# Patient Record
Sex: Female | Born: 1950 | Race: White | Hispanic: No | Marital: Married | Smoking: Never smoker
Health system: Southern US, Community
[De-identification: ages and names within clinical notes are randomized; demographics above are authoritative.]

## PROBLEM LIST (undated history)

## (undated) DIAGNOSIS — R600 Localized edema: Secondary | ICD-10-CM

## (undated) DIAGNOSIS — K219 Gastro-esophageal reflux disease without esophagitis: Secondary | ICD-10-CM

## (undated) DIAGNOSIS — G4730 Sleep apnea, unspecified: Secondary | ICD-10-CM

## (undated) DIAGNOSIS — E785 Hyperlipidemia, unspecified: Secondary | ICD-10-CM

## (undated) DIAGNOSIS — G47411 Narcolepsy with cataplexy: Secondary | ICD-10-CM

## (undated) DIAGNOSIS — R011 Cardiac murmur, unspecified: Secondary | ICD-10-CM

## (undated) DIAGNOSIS — E039 Hypothyroidism, unspecified: Secondary | ICD-10-CM

## (undated) DIAGNOSIS — M810 Age-related osteoporosis without current pathological fracture: Secondary | ICD-10-CM

## (undated) DIAGNOSIS — G629 Polyneuropathy, unspecified: Secondary | ICD-10-CM

## (undated) DIAGNOSIS — R002 Palpitations: Secondary | ICD-10-CM

## (undated) DIAGNOSIS — R7303 Prediabetes: Secondary | ICD-10-CM

## (undated) DIAGNOSIS — E669 Obesity, unspecified: Secondary | ICD-10-CM

## (undated) DIAGNOSIS — I10 Essential (primary) hypertension: Secondary | ICD-10-CM

## (undated) DIAGNOSIS — C50911 Malignant neoplasm of unspecified site of right female breast: Secondary | ICD-10-CM

## (undated) DIAGNOSIS — M65331 Trigger finger, right middle finger: Secondary | ICD-10-CM

## (undated) DIAGNOSIS — F329 Major depressive disorder, single episode, unspecified: Secondary | ICD-10-CM

## (undated) DIAGNOSIS — G2581 Restless legs syndrome: Secondary | ICD-10-CM

## (undated) DIAGNOSIS — M1990 Unspecified osteoarthritis, unspecified site: Secondary | ICD-10-CM

## (undated) DIAGNOSIS — K5792 Diverticulitis of intestine, part unspecified, without perforation or abscess without bleeding: Secondary | ICD-10-CM

## (undated) DIAGNOSIS — F32A Depression, unspecified: Secondary | ICD-10-CM

## (undated) DIAGNOSIS — I82409 Acute embolism and thrombosis of unspecified deep veins of unspecified lower extremity: Secondary | ICD-10-CM

## (undated) DIAGNOSIS — M65332 Trigger finger, left middle finger: Secondary | ICD-10-CM

## (undated) HISTORY — PX: BREAST SURGERY: SHX581

## (undated) HISTORY — PX: MASTECTOMY: SHX3

## (undated) HISTORY — PX: ABDOMINAL HYSTERECTOMY: SHX81

## (undated) HISTORY — PX: CHOLECYSTECTOMY: SHX55

## (undated) HISTORY — PX: APPENDECTOMY: SHX54

---

## 1999-06-20 DIAGNOSIS — C50911 Malignant neoplasm of unspecified site of right female breast: Secondary | ICD-10-CM

## 1999-06-20 HISTORY — DX: Malignant neoplasm of unspecified site of right female breast: C50.911

## 2013-10-13 ENCOUNTER — Ambulatory Visit: Payer: Self-pay | Admitting: Family Medicine

## 2013-11-04 ENCOUNTER — Ambulatory Visit: Payer: Self-pay | Admitting: Family Medicine

## 2013-11-05 ENCOUNTER — Ambulatory Visit: Payer: Self-pay | Admitting: Surgery

## 2013-11-07 ENCOUNTER — Ambulatory Visit: Payer: Self-pay | Admitting: Surgery

## 2013-12-15 DIAGNOSIS — C50911 Malignant neoplasm of unspecified site of right female breast: Secondary | ICD-10-CM | POA: Insufficient documentation

## 2014-01-20 DIAGNOSIS — Z1371 Encounter for nonprocreative screening for genetic disease carrier status: Secondary | ICD-10-CM | POA: Insufficient documentation

## 2014-07-22 DIAGNOSIS — M2550 Pain in unspecified joint: Secondary | ICD-10-CM | POA: Insufficient documentation

## 2014-10-10 NOTE — Op Note (Signed)
PATIENT NAME:  Karen [name], Karen [name] MR#:  [id number] DATE OF BIRTH:  1951-02-22  DATE OF PROCEDURE:  11/07/2013  PREOPERATIVE DIAGNOSIS:  Nonresolving traumatic left thigh hematoma.   POSTOPERATIVE DIAGNOSIS:  Nonresolving left thigh seroma.   PROCEDURE PERFORMED:  Incision and drainage of large left thigh seroma, approximately 20 x 7 x 2 cm.   ANESTHESIA:  General endotracheal.   ESTIMATED BLOOD LOSS:  10 mL.   COMPLICATIONS:  None.   SPECIMEN:  None.  IMPLANTS:  Two 10 French JP drains.   INDICATIONS:  Ms. Eccleston is a pleasant female with a history of a traumatic left thigh hematoma following a fall.  She had a CT scan which showed a large fluid collection.  I was concerned for a Morel-Lavalle type lesion that has an internal gloving came from shearing and due to its nonresolution and pain she was brought to the Operating Room for incision and drainage and management of this wound.   DETAILS OF PROCEDURE:  Are as follows:  Informed consent was obtained.  Ms. Billiot was brought to the Operating Room suite.  She was induced.  Endotracheal tube was placed, general anesthesia was administered.  Her left thigh was then prepped and draped in a standard surgical fashion.  A timeout was then performed correctly identifying the patient's name, operative site and procedure to be performed.  A 15 blade knife was used to incise the area which appeared to be most thin and superficial of the cavity.  Approximately 750 mL of serous fluid was immediately encountered upon popping into the cavity.  I then irrigated the cavity and two 10 [place] JP drains were placed at the most superior aspect of the lesions, one superiorly and one more dependent inferiorly.  These were sutured in place with a 3-0 nylon suture.  The more caudate incision was then closed with interrupted 3-0 nylon sutures.  A sterile dressing was then placed over the wound.  The wound and the leg was wrapped with an Ace bandage and JP bulbs were  connected.  The patient was then awoken, extubated and brought to the postanesthesia care unit.  There were no immediate complications.  Needle, sponge, and instrument counts correct at the end of the procedure.     ____________________________ Glena Norfolk. Sedale Jenifer, MD cal:ea D: 11/07/2013 22:17:23 ET T: 11/08/2013 06:34:50 ET JOB#: [id number]  cc: Harrell Gave A. Chico Cawood, MD, <Dictator> Floyde Parkins MD ELECTRONICALLY SIGNED 11/18/2013 17:15

## 2014-11-25 ENCOUNTER — Ambulatory Visit
Admission: RE | Admit: 2014-11-25 | Discharge: 2014-11-25 | Disposition: A | Discharge status: Home / Self Care | Payer: 59 | Source: Ambulatory Visit | Attending: Cardiology | Admitting: Cardiology

## 2014-11-25 ENCOUNTER — Encounter: Payer: Self-pay | Admitting: *Deleted

## 2014-11-25 ENCOUNTER — Encounter
Admission: RE | Disposition: A | Discharge status: Home / Self Care | Payer: Self-pay | Source: Ambulatory Visit | Attending: Cardiology

## 2014-11-25 DIAGNOSIS — G4730 Sleep apnea, unspecified: Secondary | ICD-10-CM | POA: Insufficient documentation

## 2014-11-25 DIAGNOSIS — M1990 Unspecified osteoarthritis, unspecified site: Secondary | ICD-10-CM | POA: Insufficient documentation

## 2014-11-25 DIAGNOSIS — R0789 Other chest pain: Secondary | ICD-10-CM | POA: Insufficient documentation

## 2014-11-25 DIAGNOSIS — E785 Hyperlipidemia, unspecified: Secondary | ICD-10-CM | POA: Diagnosis not present

## 2014-11-25 DIAGNOSIS — R51 Headache: Secondary | ICD-10-CM | POA: Insufficient documentation

## 2014-11-25 DIAGNOSIS — Z811 Family history of alcohol abuse and dependence: Secondary | ICD-10-CM | POA: Diagnosis not present

## 2014-11-25 DIAGNOSIS — G629 Polyneuropathy, unspecified: Secondary | ICD-10-CM | POA: Diagnosis not present

## 2014-11-25 DIAGNOSIS — R0602 Shortness of breath: Secondary | ICD-10-CM | POA: Insufficient documentation

## 2014-11-25 DIAGNOSIS — Z800 Family history of malignant neoplasm of digestive organs: Secondary | ICD-10-CM | POA: Diagnosis not present

## 2014-11-25 DIAGNOSIS — G4700 Insomnia, unspecified: Secondary | ICD-10-CM | POA: Diagnosis not present

## 2014-11-25 DIAGNOSIS — Z833 Family history of diabetes mellitus: Secondary | ICD-10-CM | POA: Insufficient documentation

## 2014-11-25 DIAGNOSIS — Z8262 Family history of osteoporosis: Secondary | ICD-10-CM | POA: Diagnosis not present

## 2014-11-25 DIAGNOSIS — Z8249 Family history of ischemic heart disease and other diseases of the circulatory system: Secondary | ICD-10-CM | POA: Insufficient documentation

## 2014-11-25 DIAGNOSIS — Z818 Family history of other mental and behavioral disorders: Secondary | ICD-10-CM | POA: Diagnosis not present

## 2014-11-25 DIAGNOSIS — E039 Hypothyroidism, unspecified: Secondary | ICD-10-CM | POA: Insufficient documentation

## 2014-11-25 DIAGNOSIS — Z8489 Family history of other specified conditions: Secondary | ICD-10-CM | POA: Diagnosis not present

## 2014-11-25 DIAGNOSIS — Z823 Family history of stroke: Secondary | ICD-10-CM | POA: Insufficient documentation

## 2014-11-25 DIAGNOSIS — I10 Essential (primary) hypertension: Secondary | ICD-10-CM | POA: Diagnosis not present

## 2014-11-25 DIAGNOSIS — Z853 Personal history of malignant neoplasm of breast: Secondary | ICD-10-CM | POA: Diagnosis not present

## 2014-11-25 DIAGNOSIS — Z79899 Other long term (current) drug therapy: Secondary | ICD-10-CM | POA: Insufficient documentation

## 2014-11-25 DIAGNOSIS — Z8261 Family history of arthritis: Secondary | ICD-10-CM | POA: Insufficient documentation

## 2014-11-25 DIAGNOSIS — K219 Gastro-esophageal reflux disease without esophagitis: Secondary | ICD-10-CM | POA: Diagnosis not present

## 2014-11-25 DIAGNOSIS — Z803 Family history of malignant neoplasm of breast: Secondary | ICD-10-CM | POA: Diagnosis not present

## 2014-11-25 DIAGNOSIS — F329 Major depressive disorder, single episode, unspecified: Secondary | ICD-10-CM | POA: Diagnosis not present

## 2014-11-25 DIAGNOSIS — G47411 Narcolepsy with cataplexy: Secondary | ICD-10-CM | POA: Insufficient documentation

## 2014-11-25 HISTORY — DX: Sleep apnea, unspecified: G47.30

## 2014-11-25 HISTORY — DX: Hypothyroidism, unspecified: E03.9

## 2014-11-25 HISTORY — PX: CARDIAC CATHETERIZATION: SHX172

## 2014-11-25 SURGERY — LEFT HEART CATH
Anesthesia: Moderate Sedation

## 2014-11-25 MED ORDER — FENTANYL CITRATE (PF) 100 MCG/2ML IJ SOLN
INTRAMUSCULAR | Status: DC | PRN
  Administered 2014-11-25: 50 ug via INTRAVENOUS

## 2014-11-25 MED ORDER — SODIUM CHLORIDE 0.9 % IV SOLN: 250.0000 mL | INTRAVENOUS | Status: DC | PRN

## 2014-11-25 MED ORDER — IOHEXOL 300 MG/ML  SOLN
INTRAMUSCULAR | Status: DC | PRN
  Administered 2014-11-25: 30 mL via INTRAVENOUS
  Administered 2014-11-25: 70 mL via INTRAVENOUS

## 2014-11-25 MED ORDER — MIDAZOLAM HCL 2 MG/2ML IJ SOLN
INTRAMUSCULAR | Status: DC | PRN
  Administered 2014-11-25 (×2): 1 mg via INTRAVENOUS

## 2014-11-25 MED ORDER — FENTANYL CITRATE (PF) 100 MCG/2ML IJ SOLN
INTRAMUSCULAR | Status: AC
  Filled 2014-11-25: qty 2

## 2014-11-25 MED ORDER — SODIUM CHLORIDE 0.9 % WEIGHT BASED INFUSION: 3.0000 mL/kg/h | INTRAVENOUS | Status: DC

## 2014-11-25 MED ORDER — SODIUM CHLORIDE 0.9 % IJ SOLN
3.0000 mL | INTRAMUSCULAR | Status: DC | PRN
  Administered 2014-11-25: 3 mL via INTRAVENOUS
  Filled 2014-11-25: qty 10

## 2014-11-25 MED ORDER — MIDAZOLAM HCL 2 MG/2ML IJ SOLN
INTRAMUSCULAR | Status: AC
  Filled 2014-11-25: qty 2

## 2014-11-25 MED ORDER — SODIUM CHLORIDE 0.9 % IV SOLN
INTRAVENOUS | Status: DC
  Administered 2014-11-25: 07:00:00 via INTRAVENOUS

## 2014-11-25 MED ORDER — SODIUM CHLORIDE 0.9 % IJ SOLN: 3.0000 mL | INTRAMUSCULAR | Status: DC | PRN

## 2014-11-25 MED ORDER — SODIUM CHLORIDE 0.9 % IJ SOLN: 3.0000 mL | Freq: Two times a day (BID) | INTRAMUSCULAR | Status: DC

## 2014-11-25 MED ORDER — ONDANSETRON HCL 4 MG/2ML IJ SOLN: 4.0000 mg | Freq: Four times a day (QID) | INTRAMUSCULAR | Status: DC | PRN

## 2014-11-25 MED ORDER — ACETAMINOPHEN 325 MG PO TABS: 650.0000 mg | ORAL_TABLET | ORAL | Status: DC | PRN

## 2014-11-25 MED ORDER — HEPARIN (PORCINE) IN NACL 2-0.9 UNIT/ML-% IJ SOLN
INTRAMUSCULAR | Status: AC
  Filled 2014-11-25: qty 1000

## 2014-11-25 MED ORDER — DIAZEPAM 5 MG PO TABS: 5.0000 mg | ORAL_TABLET | ORAL | Status: DC | PRN

## 2014-11-25 SURGICAL SUPPLY — 9 items
CATH INFINITI 5FR ANG PIGTAIL (CATHETERS) ×2 IMPLANT
CATH INFINITI 5FR JL4 (CATHETERS) ×2 IMPLANT
CATH INFINITI JR4 5F (CATHETERS) ×2 IMPLANT
DEVICE CLOSURE MYNXGRIP 5F (Vascular Products) ×2 IMPLANT
KIT MANI 3VAL PERCEP (MISCELLANEOUS) ×2 IMPLANT
NEEDLE PERC 18GX7CM (NEEDLE) ×2 IMPLANT
PACK CARDIAC CATH (CUSTOM PROCEDURE TRAY) ×2 IMPLANT
SHEATH AVANTI 5FR X 11CM (SHEATH) ×2 IMPLANT
WIRE EMERALD 3MM-J .035X150CM (WIRE) ×2 IMPLANT

## 2014-11-25 NOTE — Discharge Instructions (Signed)
Coronary Angiogram °A coronary angiogram, also called coronary angiography, is an X-ray procedure used to look at the arteries in the heart. In this procedure, a dye (contrast dye) is injected through a long, hollow tube (catheter). The catheter is about the size of a piece of cooked spaghetti and is inserted through your groin, wrist, or arm. The dye is injected into each artery, and X-rays are then taken to show if there is a blockage in the arteries of your heart. °LET YOUR HEALTH CARE PROVIDER KNOW ABOUT: °· Any allergies you have, including allergies to shellfish or contrast dye.   °· All medicines you are taking, including vitamins, herbs, eye drops, creams, and over-the-counter medicines.   °· Previous problems you or members of your family have had with the use of anesthetics.   °· Any blood disorders you have.   °· Previous surgeries you have had. °· History of kidney problems or failure.   °· Other medical conditions you have. °RISKS AND COMPLICATIONS  °Generally, a coronary angiogram is a safe procedure. However, problems can occur and include: °· Allergic reaction to the dye. °· Bleeding from the access site or other locations. °· Kidney injury, especially in people with impaired kidney function.  °· Stroke (rare). °· Heart attack (rare). °BEFORE THE PROCEDURE  °· Do not eat or drink anything after midnight the night before the procedure or as directed by your health care provider.   °· Ask your health care provider about changing or stopping your regular medicines. This is especially important if you are taking diabetes medicines or blood thinners. °PROCEDURE °· You may be given a medicine to help you relax (sedative) before the procedure. This medicine is given through an intravenous (IV) access tube that is inserted into one of your veins.   °· The area where the catheter will be inserted will be washed and shaved. This is usually done in the groin but may be done in the fold of your arm (near your  elbow) or in the wrist.    °· A medicine will be given to numb the area where the catheter will be inserted (local anesthetic).   °· The health care provider will insert the catheter into an artery. The catheter will be guided by using a special type of X-ray (fluoroscopy) of the blood vessel being examined.   °· A special dye will then be injected into the catheter, and X-rays will be taken. The dye will help to show where any narrowing or blockages are located in the heart arteries.   °AFTER THE PROCEDURE  °· If the procedure is done through the leg, you will be kept in bed lying flat for several hours. You will be instructed to not bend or cross your legs. °· The insertion site will be checked frequently.   °· The pulse in your feet or wrist will be checked frequently.   °· Additional blood tests, X-rays, and an electrocardiogram may be done.   °Document Released: 12/10/2002 Document Revised: 10/20/2013 Document Reviewed: 10/28/2012 °[employer]® Patient Information ©2015 [employer]. This information is not intended to replace advice given to you by your health care provider. Make sure you discuss any questions you have with your health care provider. ° °

## 2014-11-25 NOTE — H&P (Signed)
Chief Complaint: Chief Complaint  Patient presents with  . Shortness of Breath  still with exertion  . Follow-up  had echo and stress  Date of Service: 11/17/2014 Date of Birth: Oct 26, 1950 PCP: Ricardo Jericho, NP  History of Present Illness: Karen [name] is a 64 y.o.female patient who presents for follow-up visit. Continues to have exertional chest discomfort with shortness of breath associated with it. She underwent a functional study with Lexiscan stress which caused a headache all day long. Study showed no high-grade ischemia. Echocardiogram showed normal LV function with mild MR and TR. She denies syncope. She is compliant with her medications. She is very debilitated due to persistent shortness of breath and exertional chest pain. She is unable to carry out daily activities due to her symptoms. Past Medical and Surgical History  Past Medical History Past Medical History  Diagnosis Date  . Osteoporosis, post-menopausal  . Thyroid disease  . Depression  . Hypertension  . Arthritis  . Hyperlipidemia  . Sleep apnea  . Trigger middle finger  bilateral  . Hypothyroidism  . Encounter for blood transfusion 03/19/1976  . GERD (gastroesophageal reflux disease)  . Cancer  . Breast cancer  . 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 Neuropathy 2015  Mild - per test results   Past Surgical History She has past surgical history that includes Cholecystectomy (08/2010); Hysterectomy; Cholecystectomy; Mastectomy partial / lumpectomy; trigger finger release (Bilateral, 03/02/2014); Appendectomy; and Appendectomy (08/2010).   Medications and Allergies  Current Medications  Current Outpatient Prescriptions  Medication Sig Dispense Refill  . levothyroxine (SYNTHROID, LEVOTHROID) 75 MCG tablet Take 1 tablet (75 mcg total) by mouth once daily. 30 tablet prn  . lisinopril-hydrochlorothiazide (PRINZIDE,ZESTORETIC) 20-12.5 mg tablet Take 1 tablet by mouth once  daily. 30 tablet 9  . meloxicam (MOBIC) 15 MG tablet Take 1 tablet (15 mg total) by mouth once daily. 30 tablet 2  . omeprazole (PRILOSEC) 20 MG DR capsule Take 1 capsule (20 mg total) by mouth once daily. 30 capsule prn  . pramipexole (MIRAPEX) 1 MG tablet Take 1 tablet (1 mg total) by mouth 2 (two) times daily. 60 tablet 5  . simvastatin (ZOCOR) 20 MG tablet Take 1 tablet (20 mg total) by mouth once daily. 30 tablet 3  . tamoxifen (NOLVADEX) 20 MG tablet Take 1 tablet (20 mg total) by mouth once daily. 30 tablet 11  . venlafaxine (EFFEXOR-XR) 150 MG XR capsule Take 2 capsules by mouth once daily. 0   No current facility-administered medications for this visit.   Allergies: Review of patient's allergies indicates no known allergies.  Social and Family History  Social History reports that she has never smoked. She has never used smokeless tobacco. She reports that she drinks about 1.2 oz of alcohol per week. She reports that she does not use illicit drugs.  Family History Family History  Problem Relation Age of Onset  . Mental illness Father  . Cancer Sister  breast  . Breast cancer Other  . Diabetes mellitus Maternal Grandmother  . Alcohol abuse Father  . Alcohol abuse Paternal Aunt  . Alcohol abuse Paternal Grandfather  . Anxiety disorder Father  . Breast cancer Mother  . Breast cancer Sister  . Colon cancer Mother  . Coronary artery disease Mother  . Coronary artery disease Father  . Depression Father  . Hypertension Mother  . Hypertension Father  . Osteoarthritis Mother  . Osteoporosis Mother  . Cancer Mother  breast  .  Cancer Sister  . Diabetes mellitus Mother  . Heart attack Mother   Review of Systems  Review of Systems  Constitutional: Negative for fever, chills, weight loss, malaise/fatigue and diaphoresis.  HENT: Negative for congestion, ear discharge, hearing loss and tinnitus.  Eyes: Negative for blurred vision.  Respiratory: Positive for shortness of  breath. Negative for cough, hemoptysis, sputum production and wheezing.  Cardiovascular: Positive for chest pain. Negative for palpitations, orthopnea, claudication, leg swelling and PND.  Gastrointestinal: Negative for heartburn, nausea, vomiting, abdominal pain, diarrhea, constipation, blood in stool and melena.  Genitourinary: Negative for dysuria, urgency, frequency and hematuria.  Musculoskeletal: Negative for myalgias, back pain, joint pain and falls.  Skin: Negative for itching and rash.  Neurological: Negative for dizziness, tingling, focal weakness, loss of consciousness, weakness and headaches.  Endo/Heme/Allergies: Negative for polydipsia. Does not bruise/bleed easily.  Psychiatric/Behavioral: Negative for depression, memory loss and substance abuse. The patient is not nervous/anxious.    Physical Examination   Vitals:BP 130/80 mmHg  Pulse 96  Resp 10  Ht 172.7 cm (5\' 8" )  Wt 101.606 kg (224 lb)  BMI 34.07 kg/m2 Ht:172.7 cm (5\' 8" ) Wt:101.606 kg (224 lb) QMG:QQPY surface area is 2.21 meters squared. Body mass index is 34.07 kg/(m^2).  Wt Readings from Last 3 Encounters:  11/17/14 101.606 kg (224 lb)  10/22/14 98.884 kg (218 lb)  10/13/14 99.338 kg (219 lb)   BP Readings from Last 3 Encounters:  11/17/14 130/80  10/22/14 130/88  10/13/14 132/86   General appearance appears in no acute distress  Head Mouth and Eye exam Normocephalic, without obvious abnormality, atraumatic Dentition is good Eyes appear anicteric   Neck exam Thyroid: normal  Nodes: no obvious adenopathy  LUNGS Breath Sounds: Normal Percussion: Normal  CARDIOVASCULAR JVP CV wave: no HJR: no Elevation at 90 degrees: None Carotid Pulse: normal pulsation bilaterally Bruit: None Apex: apical impulse normal  Auscultation Rhythm: normal sinus rhythm S1: normal S2: normal Clicks: no Rub: no Murmurs: no murmurs  Gallop: None ABDOMEN Liver enlargement: no Pulsatile aorta:  no Ascites: no Bruits: no  EXTREMITIES Clubbing: no Edema: trace to 1+ bilateral pedal edema Pulses: peripheral pulses symmetrical Femoral Bruits: no Amputation: no SKIN Rash: no Cyanosis: no Embolic phemonenon: no Bruising: no NEURO Alert and Oriented to person, place and time: yes Non focal: yes  PSYCH: Pt appears to have normal affect  LABS REVIEWED Last 3 CBC results: Lab Results  Component Value Date  WBC 6.0 07/23/2014   Lab Results  Component Value Date  HGB 13.1 07/23/2014   Lab Results  Component Value Date  HCT 40.6 07/23/2014   Lab Results  Component Value Date  PLT 239 07/23/2014   Lab Results  Component Value Date  CREATININE 0.8 12/31/2013  BUN 14 12/31/2013  NA 135* 12/31/2013  K 3.9 12/31/2013  CL 101 12/31/2013  CO2 27.9 12/31/2013   Lab Results  Component Value Date  HDL 43 07/23/2014   Lab Results  Component Value Date  LDLCALC 108 07/23/2014   Lab Results  Component Value Date  TRIG 171 07/23/2014   Lab Results  Component Value Date  ALT 22 12/31/2013  AST 28 12/31/2013  ALKPHOS 49 12/31/2013   Lab Results  Component Value Date  TSH 1.502 05/05/2014   Assessment and Plan   64 y.o. female with  ICD-10-CM ICD-9-CM  1. SOB (shortness of breath)-shortness of breath is unclear etiology. ECHO showed no high-grade valvular abnormalities. Occult ischemia could still be playing a role. Risk  and benefits a left cardiac catheterization were explained to the patient and her husband. She is quite distraught over her symptoms but is unable to carry out daily activities due to concern over possible coronary disease. Will proceed with a cardiac catheterization evaluate coronary anatomy R06.02 786.05  2. Hyperlipidemia, unspecified hyperlipidemia type-continue with simvastatin an LDL goal of less than 100 E78.5 272.4  3. Essential hypertension with goal blood pressure less than 140/90-blood pressure controlled with  lisinopril-hydrochlorothiazide. Will continue with this regimen and follow I10 401.9  4. Primary insomnia F51.01 307.42  5. Bilateral leg edema-no significant pulmonary hypertension R60.0 782.3  6. Chest pain on exertion-symptoms persist. Risk and benefits a left cardiac catheterization were discussed. Will proceed left cardiac catheterization to evaluate coronary anatomy to guide further therapy in a patient with severe symptoms despite negative functional study. R07.9 786.50    No Follow-up on file.  These notes generated with voice recognition software. I apologize for typographical errors.  Sydnee Levans, MD

## 2014-12-03 DIAGNOSIS — G4733 Obstructive sleep apnea (adult) (pediatric): Secondary | ICD-10-CM | POA: Insufficient documentation

## 2014-12-04 ENCOUNTER — Other Ambulatory Visit: Payer: Self-pay | Admitting: Cardiology

## 2014-12-04 DIAGNOSIS — R600 Localized edema: Secondary | ICD-10-CM

## 2014-12-10 ENCOUNTER — Ambulatory Visit
Admission: RE | Admit: 2014-12-10 | Discharge: 2014-12-10 | Disposition: A | Discharge status: Home / Self Care | Payer: 59 | Source: Ambulatory Visit | Attending: Cardiology | Admitting: Cardiology

## 2014-12-10 DIAGNOSIS — R600 Localized edema: Secondary | ICD-10-CM

## 2014-12-24 DIAGNOSIS — Z6835 Body mass index (BMI) 35.0-35.9, adult: Secondary | ICD-10-CM | POA: Insufficient documentation

## 2015-01-20 DIAGNOSIS — K582 Mixed irritable bowel syndrome: Secondary | ICD-10-CM | POA: Insufficient documentation

## 2015-02-05 ENCOUNTER — Emergency Department
Admission: EM | Admit: 2015-02-05 | Discharge: 2015-02-05 | Disposition: A | Discharge status: Home / Self Care | Payer: 59 | Attending: Emergency Medicine | Admitting: Emergency Medicine

## 2015-02-05 ENCOUNTER — Other Ambulatory Visit: Payer: Self-pay

## 2015-02-05 ENCOUNTER — Encounter: Payer: Self-pay | Admitting: Emergency Medicine

## 2015-02-05 ENCOUNTER — Emergency Department: Payer: 59

## 2015-02-05 DIAGNOSIS — Z791 Long term (current) use of non-steroidal anti-inflammatories (NSAID): Secondary | ICD-10-CM | POA: Diagnosis not present

## 2015-02-05 DIAGNOSIS — R2243 Localized swelling, mass and lump, lower limb, bilateral: Secondary | ICD-10-CM | POA: Insufficient documentation

## 2015-02-05 DIAGNOSIS — R0602 Shortness of breath: Secondary | ICD-10-CM | POA: Diagnosis not present

## 2015-02-05 DIAGNOSIS — Z79899 Other long term (current) drug therapy: Secondary | ICD-10-CM | POA: Insufficient documentation

## 2015-02-05 DIAGNOSIS — R609 Edema, unspecified: Secondary | ICD-10-CM

## 2015-02-05 LAB — BASIC METABOLIC PANEL
ANION GAP: 9 (ref 5–15)
BUN: 10 mg/dL (ref 6–20)
CHLORIDE: 101 mmol/L (ref 101–111)
CO2: 28 mmol/L (ref 22–32)
Calcium: 9.4 mg/dL (ref 8.9–10.3)
Creatinine, Ser: 0.61 mg/dL (ref 0.44–1.00)
GFR calc non Af Amer: >60 mL/min (ref >60)
Glucose, Bld: 96 mg/dL (ref 65–99)
POTASSIUM: 3.8 mmol/L (ref 3.5–5.1)
SODIUM: 138 mmol/L (ref 135–145)

## 2015-02-05 LAB — CBC
HEMATOCRIT: 40.2 % (ref 35.0–47.0)
HEMOGLOBIN: 13.1 g/dL (ref 12.0–16.0)
MCH: 27.5 pg (ref 26.0–34.0)
MCHC: 32.5 g/dL (ref 32.0–36.0)
MCV: 84.7 fL (ref 80.0–100.0)
Platelets: 184 10*3/uL (ref 150–440)
RBC: 4.75 MIL/uL (ref 3.80–5.20)
RDW: 14.8 % — ABNORMAL HIGH (ref 11.5–14.5)
WBC: 8.2 10*3/uL (ref 3.6–11.0)

## 2015-02-05 MED ORDER — POTASSIUM CHLORIDE CRYS ER 20 MEQ PO TBCR
20.0000 meq | EXTENDED_RELEASE_TABLET | Freq: Once | ORAL | Status: AC
  Administered 2015-02-05: 20 meq via ORAL
  Filled 2015-02-05: qty 1

## 2015-02-05 MED ORDER — POTASSIUM CHLORIDE CRYS ER 20 MEQ PO TBCR: 20.0000 meq | EXTENDED_RELEASE_TABLET | Freq: Once | ORAL | Status: DC

## 2015-02-05 MED ORDER — FUROSEMIDE 40 MG PO TABS: 40.0000 mg | ORAL_TABLET | Freq: Once | ORAL | Status: DC

## 2015-02-05 MED ORDER — FUROSEMIDE 40 MG PO TABS
40.0000 mg | ORAL_TABLET | Freq: Once | ORAL | Status: AC
  Administered 2015-02-05: 40 mg via ORAL
  Filled 2015-02-05: qty 1

## 2015-02-05 NOTE — Discharge Instructions (Signed)
You were evaluated for lower extremity swelling which I think is due to peripheral edema after being taken off of the hydrochlorothiazide diuretic component. You're being placed in an other type of diuretic called Lasix for 3 days. This is being given with a potassium supplementation to prevent low potassium. You may need your kidney function and potassium rechecked by lab draw at a primary care physician's appointment.  Return to the emergency department for any new or worsening condition including any new or worsening chest pain, palpitations, shortness of breath, dizziness, passing out, one-sided leg swelling, leg pain, fever, or skin rash.  We discussed the possibly of rechecking her thyroid function, and possible estrogen/sex hormone levels given the recent changes in weight/metabolism. Discuss Peripheral Edema You have swelling in your legs (peripheral edema). This swelling is due to excess accumulation of salt and water in your body. Edema may be a sign of heart, kidney or liver disease, or a side effect of a medication. It may also be due to problems in the leg veins. Elevating your legs and using special support stockings may be very helpful, if the cause of the swelling is due to poor venous circulation. Avoid long periods of standing, whatever the cause. Treatment of edema depends on identifying the cause. Chips, pretzels, pickles and other salty foods should be avoided. Restricting salt in your diet is almost always needed. Water pills (diuretics) are often used to remove the excess salt and water from your body via urine. These medicines prevent the kidney from reabsorbing sodium. This increases urine flow. Diuretic treatment may also result in lowering of potassium levels in your body. Potassium supplements may be needed if you have to use diuretics daily. Daily weights can help you keep track of your progress in clearing your edema. You should call your caregiver for follow up care as  recommended. SEEK IMMEDIATE MEDICAL CARE IF:   You have increased swelling, pain, redness, or heat in your legs.  You develop shortness of breath, especially when lying down.  You develop chest or abdominal pain, weakness, or fainting.  You have a fever. Document Released: 07/13/2004 Document Revised: 08/28/2011 Document Reviewed: 06/23/2009 [facility] Patient Information 2015 [place], [place]. This information is not intended to replace advice given to you by your health care provider. Make sure you discuss any questions you have with your health care provider.  this with your primary care physician.

## 2015-02-05 NOTE — ED Provider Notes (Signed)
[facility] Emergency Department Provider Note   ____________________________________________  Time seen: 9:10 PM I have reviewed the triage vital signs and the triage nursing note.  HISTORY  Chief Complaint Lymphadenopathy   Historian Patient  HPI Karen [name] is a 64 y.o. female who is here for evaluation of bilateral lower extremity edema which has been increasing over approximately last 10 days. Just run that time point she was stopped off of hydrochlorothiazide due to complaint of dry mouth which is been ongoing for several months. She is followed by [facility] primary care and recently has had trouble with insomnia, a sleep study positive for mild sleep apnea, restless leg syndrome, and treatment for hypothyroidism. She recently within the past 2-3 months had a "complete" evaluation with Dr. Ubaldo Glassing of cardiology with a negative heart catheter, echocardiogram, and stress test. This was done to evaluate for exertional shortness of breath. She has been told that the shortness of breath is probably due to deconditioning and a weight gain of 50 pounds over the past one year which was unintentional. She also reports having had ultrasounds of the legs and told this was normal.    Past Medical History  Diagnosis Date  . Sleep apnea   . Hypothyroidism     There are no active problems to display for this patient.   Past Surgical History  Procedure Laterality Date  . Cardiac catheterization N/A 11/25/2014    Procedure: Left Heart Cath;  Surgeon: Teodoro Spray, MD;  Location: [facility];  Service: Cardiovascular;  Laterality: N/A;    Current Outpatient Rx  Name  Route  Sig  Dispense  Refill  . acidophilus (RISAQUAD) CAPS capsule   Oral   Take 1 capsule by mouth daily.         Marland Kitchen amLODipine (NORVASC) 10 MG tablet   Oral   Take 10 mg by mouth daily.         Marland Kitchen b complex vitamins tablet   Oral   Take 1 tablet by mouth daily.         Marland Kitchen  levothyroxine (SYNTHROID, LEVOTHROID) 75 MCG tablet   Oral   Take 75 mcg by mouth daily before breakfast.         . lisinopril (PRINIVIL,ZESTRIL) 20 MG tablet   Oral   Take 20 mg by mouth daily.         . meloxicam (MOBIC) 15 MG tablet   Oral   Take 15 mg by mouth daily.         . Omega-3 Fatty Acids (FISH OIL) 1000 MG CAPS   Oral   Take 1,000 mg by mouth daily.         Marland Kitchen omeprazole (PRILOSEC) 20 MG capsule   Oral   Take 20 mg by mouth daily.         . pramipexole (MIRAPEX) 1 MG tablet   Oral   Take 1 mg by mouth 3 (three) times daily.         . simvastatin (ZOCOR) 20 MG tablet   Oral   Take 20 mg by mouth daily.         . tamoxifen (NOLVADEX) 20 MG tablet   Oral   Take 20 mg by mouth daily.         Marland Kitchen venlafaxine XR (EFFEXOR-XR) 150 MG 24 hr capsule   Oral   Take 300 mg by mouth daily.          . furosemide (  LASIX) 40 MG tablet   Oral   Take 1 tablet (40 mg total) by mouth once.   2 tablet   0   . potassium chloride SA (K-DUR,KLOR-CON) 20 MEQ tablet   Oral   Take 1 tablet (20 mEq total) by mouth once.   2 tablet   0     Allergies Review of patient's allergies indicates no known allergies.  No family history on file.  Social History Social History  Substance Use Topics  . Smoking status: Never Smoker   . Smokeless tobacco: Never Used  . Alcohol Use: Yes    Review of Systems  Constitutional: Negative for fever. Eyes: Negative for visual changes. ENT: Negative for sore throat. Cardiovascular: Negative for chest pain or palpitations. Respiratory: Chronic shortness of breath which is worse with walking or mild exertion. Gastrointestinal: Negative for abdominal pain, vomiting and diarrhea. Genitourinary: Negative for dysuria. Musculoskeletal: Negative for back pain. Skin: Negative for rash. Neurological: Negative for headaches, focal weakness or numbness. 10 point Review of Systems otherwise  negative ____________________________________________   PHYSICAL EXAM:  VITAL SIGNS: ED Triage Vitals  Enc Vitals Group     BP 02/05/15 1554 146/84 mmHg     Pulse Rate 02/05/15 1554 96     Resp 02/05/15 1554 18     Temp 02/05/15 1554 98.4 F (36.9 C)     Temp Source 02/05/15 1554 Oral     SpO2 02/05/15 1554 100 %     Weight 02/05/15 1554 231 lb (104.781 kg)     Height 02/05/15 1554 5\' 7"  (1.702 m)     Head Cir --      Peak Flow --      Pain Score 02/05/15 1552 0     Pain Loc --      Pain Edu? --      Excl. in [place]? --      Constitutional: Alert and oriented. Well appearing and in no distress. Eyes: Conjunctivae are normal. PERRL. Normal extraocular movements. ENT   Head: Normocephalic and atraumatic.   Nose: No congestion/rhinnorhea.   Mouth/Throat: Mucous membranes are moist.   Neck: No stridor. Cardiovascular/Chest: Normal rate, regular rhythm.  No murmurs, rubs, or gallops. Respiratory: Normal respiratory effort without tachypnea nor retractions. Breath sounds are clear and equal bilaterally. No wheezes/rales/rhonchi. Gastrointestinal: Soft. No distention, no guarding, no rebound. Nontender , obese.  Genitourinary/rectal:Deferred Musculoskeletal: Nontender with normal range of motion in all extremities. No joint effusions.  No lower extremity tenderness. 3+ lower extremity pitting edema bilateral lower extremities from the feet all the way to the thighs. Neurologic:  Normal speech and language. No gross or focal neurologic deficits are appreciated. Skin:  Skin is warm, dry and intact. Mild erythema to the lower legs without cellulitis, purpura. Psychiatric: Mood and affect are normal. Speech and behavior are normal. Patient exhibits appropriate insight and judgment.  ____________________________________________   EKG I, Lisa Roca, MD, the attending physician have personally viewed and interpreted all ECGs.  Normal sinus rhythm. 100 beats per minute.  Narrow QRS. Normal axis. Nonspecific T-wave with T-wave inversion inferiorly. No prior EKG for evaluation to compare ____________________________________________  LABS (pertinent positives/negatives)  Basic metabolic panel without significant abnormalities CBC within normal limits  ____________________________________________  RADIOLOGY All Xrays were viewed by me. Imaging interpreted by Radiologist.  Chest x-ray: Negative __________________________________________  PROCEDURES  Procedure(s) performed: None Critical Care performed: None  ____________________________________________   ED COURSE / ASSESSMENT AND PLAN  CONSULTATIONS: None  Pertinent labs & imaging  results that were available during my care of the patient were reviewed by me and considered in my medical decision making (see chart for details).   Patient's main concern is the lower extremity edema. She's not having any additional symptoms of congestive heart failure, and she reports very extensive and thorough cardiac workup within the past 2 months including a negative heart catheter. I think her lower extremity edema is probably peripheral edema due to having recently been taken off of chlorothiazide in an attempt to help her with dry mouth. He has not helped her with the dry mouth. I'm not sure with a dry mouth was coming from. She can follow up with primary care physician for further evaluation and treatment/management of this problem. In terms of the peripheral edema, I would have her do 3 days of Lasix with potassium supplementation. She has an appointment on Tuesday with her primary care physician already.  Patient / Family / Caregiver informed of clinical course, medical decision-making process, and agree with plan.   I discussed return precautions, follow-up instructions, and discharged instructions with patient and/or family.  ___________________________________________   FINAL CLINICAL IMPRESSION(S) /  ED DIAGNOSES   Final diagnoses:  Peripheral edema    FOLLOW UP  Referred to: Primary care physician as scheduled on Tuesday.   Lisa Roca, MD 02/05/15 2152

## 2015-02-05 NOTE — ED Notes (Signed)
Pt waiting for husband to come back to take her home

## 2015-02-05 NOTE — ED Notes (Signed)
Pt presents to ed with c/o swelling bilateral legs and abd pt states she hasn't changed her eating happens but states she has gained a lot of weight. Was recently changed on some of her medications for dry mouth and was taken off HCTZ and continued on lisinopril and started on amlodipine.

## 2015-02-05 NOTE — ED Notes (Signed)
Last Saturday , bilateral feet swelling , increased swelling through out the week with a noticeable weight gain in 10 days, stomach feels bloated., increasing SHOB with activity.

## 2016-04-18 ENCOUNTER — Ambulatory Visit (INDEPENDENT_AMBULATORY_CARE_PROVIDER_SITE_OTHER): Payer: Self-pay | Admitting: Vascular Surgery

## 2016-08-08 DIAGNOSIS — R7303 Prediabetes: Secondary | ICD-10-CM | POA: Insufficient documentation

## 2016-09-18 ENCOUNTER — Other Ambulatory Visit: Payer: Self-pay | Admitting: Cardiology

## 2016-09-18 DIAGNOSIS — R0602 Shortness of breath: Secondary | ICD-10-CM

## 2016-09-20 ENCOUNTER — Ambulatory Visit
Admission: RE | Admit: 2016-09-20 | Discharge: 2016-09-20 | Disposition: A | Discharge status: Home / Self Care | Payer: BLUE CROSS/BLUE SHIELD | Source: Ambulatory Visit | Attending: Cardiology | Admitting: Cardiology

## 2016-09-20 DIAGNOSIS — R0602 Shortness of breath: Secondary | ICD-10-CM | POA: Insufficient documentation

## 2016-09-20 DIAGNOSIS — I700 Atherosclerosis of aorta: Secondary | ICD-10-CM | POA: Diagnosis not present

## 2016-09-20 HISTORY — DX: Malignant neoplasm of unspecified site of right female breast: C50.911

## 2016-09-20 MED ORDER — IOPAMIDOL (ISOVUE-370) INJECTION 76%
75.0000 mL | Freq: Once | INTRAVENOUS | Status: AC | PRN
  Administered 2016-09-20: 75 mL via INTRAVENOUS

## 2016-09-25 ENCOUNTER — Other Ambulatory Visit: Payer: Self-pay | Admitting: Neurology

## 2016-09-25 DIAGNOSIS — R202 Paresthesia of skin: Principal | ICD-10-CM

## 2016-09-25 DIAGNOSIS — R200 Anesthesia of skin: Secondary | ICD-10-CM

## 2016-10-04 ENCOUNTER — Ambulatory Visit: Payer: BLUE CROSS/BLUE SHIELD

## 2016-10-21 DIAGNOSIS — E871 Hypo-osmolality and hyponatremia: Secondary | ICD-10-CM | POA: Insufficient documentation

## 2016-11-07 DIAGNOSIS — G4489 Other headache syndrome: Secondary | ICD-10-CM | POA: Insufficient documentation

## 2016-11-14 DIAGNOSIS — R635 Abnormal weight gain: Secondary | ICD-10-CM

## 2016-11-14 DIAGNOSIS — R609 Edema, unspecified: Secondary | ICD-10-CM | POA: Insufficient documentation

## 2016-11-14 DIAGNOSIS — E559 Vitamin D deficiency, unspecified: Secondary | ICD-10-CM | POA: Insufficient documentation

## 2016-12-26 DIAGNOSIS — G8929 Other chronic pain: Secondary | ICD-10-CM | POA: Insufficient documentation

## 2016-12-26 DIAGNOSIS — D72825 Bandemia: Secondary | ICD-10-CM | POA: Insufficient documentation

## 2016-12-26 DIAGNOSIS — M79672 Pain in left foot: Secondary | ICD-10-CM

## 2016-12-26 DIAGNOSIS — R6883 Chills (without fever): Secondary | ICD-10-CM | POA: Insufficient documentation

## 2017-01-01 ENCOUNTER — Other Ambulatory Visit: Payer: Self-pay

## 2017-01-02 DIAGNOSIS — G4709 Other insomnia: Secondary | ICD-10-CM | POA: Insufficient documentation

## 2017-01-09 ENCOUNTER — Ambulatory Visit (INDEPENDENT_AMBULATORY_CARE_PROVIDER_SITE_OTHER): Payer: Medicare HMO

## 2017-01-09 ENCOUNTER — Ambulatory Visit (INDEPENDENT_AMBULATORY_CARE_PROVIDER_SITE_OTHER): Payer: Medicare HMO | Admitting: Podiatry

## 2017-01-09 VITALS — BP 143/90 | HR 67 | Temp 97.1°F | Resp 16

## 2017-01-09 DIAGNOSIS — M79672 Pain in left foot: Secondary | ICD-10-CM

## 2017-01-09 DIAGNOSIS — M779 Enthesopathy, unspecified: Secondary | ICD-10-CM | POA: Diagnosis not present

## 2017-01-09 DIAGNOSIS — M7752 Other enthesopathy of left foot: Secondary | ICD-10-CM

## 2017-01-09 MED ORDER — DICLOFENAC SODIUM 75 MG PO TBEC: 75.0000 mg | DELAYED_RELEASE_TABLET | Freq: Two times a day (BID) | ORAL | 0 refills | Status: DC

## 2017-01-09 MED ORDER — HYDROCODONE-ACETAMINOPHEN 5-325 MG PO TABS: 1.0000 | ORAL_TABLET | Freq: Four times a day (QID) | ORAL | 0 refills | Status: DC | PRN

## 2017-01-09 NOTE — Progress Notes (Signed)
Patient ID: Karen [name], female   DOB: 1951/02/06, 66 y.o.   MRN: [id number]   HPI:  Patient presents today as a new patient for evaluation of swelling localized to the back of the left heel times 2-3 weeks. Patient cannot remember or recall any trauma. All this and she noticed that she had significant amount of swelling to the posterior heel. Patient had x-rays taken at [facility] urgent care on 12/27/2016 which were negative for fracture. Patient states it's extremely painful and keeps her awake at night. Patient has been taking Tylenol with no relief.    Physical Exam: General: The patient is alert and oriented x3 in no acute distress.  Dermatology: Skin is warm, dry and supple bilateral lower extremities. Negative for open lesions or macerations.  Vascular: Palpable pedal pulses bilaterally. No edema or erythema noted. Capillary refill within normal limits.  Neurological: Epicritic and protective threshold grossly intact bilaterally.   Musculoskeletal Exam: Large palpable mass noted to the posterior heel of the left lower extremity. This appears soft tissue in nature. There is diffuse edema noted throughout the posterior ankle joint. Muscle strength 5/5 in all muscle groups left lower extremity including plantar flexion. Achilles tendon appears to be intact. Hammertoe contracture deformity also noted digits 2-3 right foot with clinical evidence of bunion deformity right foot  Assessment: 1. Subacute is bursitis left 2 weeks vs. possible insertional Achilles tendon rupture vs. calcaneal fracture left. 2. 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 Hallux abductovalgus bunion deformity right foot   Plan of Care:  1. Patient was evaluated. 2. Today were going to order an MRI of the left posterior heel to rule out calcaneal fracture versus Achilles tendon pathology versus sub-Achilles bursitis 3. Prescription for anti-inflammatory pain cream through [facility] 4. Prescription  for Vicodin 10/325 mg 5. Prescription for diclofenac 75 mg 6. Return to clinic in 3 weeks to review MRI results 7. Today and immobilization cam boot was provided for the patient with a Achilles tendinitis gel cushion     Edrick Kins, DPM [facility]  Dr. Edrick Kins, DPM    [address], [place] [address]                Office [phone]  Fax [phone]

## 2017-01-09 NOTE — Progress Notes (Signed)
Subjective:     Patient ID: Karen [name], female   DOB: 19-Mar-1951, 66 y.o.   MRN: [id number]  HPI   Review of Systems  Cardiovascular: Positive for leg swelling.  Musculoskeletal: Positive for arthralgias.  All other systems reviewed and are negative.      Objective:   Physical Exam     Assessment:         Plan:

## 2017-01-11 NOTE — Addendum Note (Signed)
Addended by: Graceann Congress D on: 01/11/2017 08:17 AM   Modules accepted: Orders

## 2017-01-22 ENCOUNTER — Encounter: Payer: Self-pay | Admitting: Podiatry

## 2017-01-22 ENCOUNTER — Telehealth: Payer: Self-pay | Admitting: Podiatry

## 2017-01-22 NOTE — Telephone Encounter (Signed)
I was calling to see if you got authorization from my insurance for me to get an MRI.

## 2017-01-23 NOTE — Telephone Encounter (Signed)
[place] patient  °

## 2017-01-26 ENCOUNTER — Telehealth: Payer: Self-pay | Admitting: *Deleted

## 2017-01-26 NOTE — Telephone Encounter (Addendum)
Pt states she received a letter stating her pre-cert MRI had been denied by [employer], and she has started an appeal under Case# [id number], please fax any information to [phone]/05/2017-Debbie - [facility] states received a referral for MRI and authorization, but is to be performed there or [facility].

## 2017-01-29 ENCOUNTER — Telehealth: Payer: Self-pay

## 2017-01-29 NOTE — Telephone Encounter (Signed)
LVm for patient to confirm auth number for appealed MRI

## 2017-01-29 NOTE — Telephone Encounter (Signed)
Prior auth appeal approval # [id number], faxed to [facility]

## 2017-01-30 ENCOUNTER — Ambulatory Visit (INDEPENDENT_AMBULATORY_CARE_PROVIDER_SITE_OTHER): Payer: Medicare HMO | Admitting: Podiatry

## 2017-01-30 ENCOUNTER — Telehealth: Payer: Self-pay

## 2017-01-30 DIAGNOSIS — M779 Enthesopathy, unspecified: Secondary | ICD-10-CM

## 2017-01-30 DIAGNOSIS — M7752 Other enthesopathy of left foot: Secondary | ICD-10-CM

## 2017-01-30 NOTE — Telephone Encounter (Signed)
Per [employer],  MRI has been approved from 01/27/17 to 04/29/17 Auth # [id number] Patient is scheduled for MRI on 02/05/17 @ 8am

## 2017-02-05 ENCOUNTER — Ambulatory Visit
Admission: RE | Admit: 2017-02-05 | Discharge: 2017-02-05 | Disposition: A | Discharge status: Home / Self Care | Payer: Medicare HMO | Source: Ambulatory Visit | Attending: Podiatry | Admitting: Podiatry

## 2017-02-05 DIAGNOSIS — X58XXXA Exposure to other specified factors, initial encounter: Secondary | ICD-10-CM | POA: Insufficient documentation

## 2017-02-05 DIAGNOSIS — S86012A Strain of left Achilles tendon, initial encounter: Secondary | ICD-10-CM | POA: Diagnosis not present

## 2017-02-05 DIAGNOSIS — M7752 Other enthesopathy of left foot: Secondary | ICD-10-CM | POA: Diagnosis present

## 2017-02-05 DIAGNOSIS — M779 Enthesopathy, unspecified: Secondary | ICD-10-CM

## 2017-02-05 DIAGNOSIS — M7662 Achilles tendinitis, left leg: Secondary | ICD-10-CM | POA: Insufficient documentation

## 2017-02-05 DIAGNOSIS — M19072 Primary osteoarthritis, left ankle and foot: Secondary | ICD-10-CM | POA: Diagnosis not present

## 2017-02-05 NOTE — Progress Notes (Signed)
Patient ID: Karen [name], female   DOB: September 04, 1950, 66 y.o.   MRN: [id number]   HPI:  66 year old female presents the office today for follow-up evaluation regarding possible insertional Achilles tendon rupture to the left lower extremity. Patient states that the pain is the same. She still having a lot of pain and MRI is scheduled for 02/05/2017. Patient also experiences right foot swelling on the top of the foot going on for the last week, likely due to compensation ambulation.   Physical Exam: General: The patient is alert and oriented x3 in no acute distress.  Dermatology: Skin is warm, dry and supple bilateral lower extremities. Negative for open lesions or macerations.  Vascular: Palpable pedal pulses bilaterally. No edema or erythema noted. Capillary refill within normal limits.  Neurological: Epicritic and protective threshold grossly intact bilaterally.   Musculoskeletal Exam: Large palpable mass noted to the posterior heel of the left lower extremity. This appears soft tissue in nature. There is diffuse edema noted throughout the posterior ankle joint. Muscle strength 5/5 in all muscle groups left lower extremity including plantar flexion. Achilles tendon appears to be intact. Hammertoe contracture deformity also noted digits 2-3 right foot with clinical evidence of bunion deformity right foot  Assessment: 1. Subacute is bursitis left 2 weeks vs. possible insertional Achilles tendon rupture vs. calcaneal fracture left vs. Haglund's deformity 2. 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 Hallux abductovalgus bunion deformity right foot   Plan of Care:  1. Patient was evaluated. 2. Patient has an appointment for an MRI on 02/05/2017. 3. Patient is going to follow-up in 2 weeks to review MRI results  Patient going to [place] in September for a weekend    Edrick Kins, DPM [facility]  Dr. Edrick Kins, DPM    [address], [place] [address]                Office [phone]  Fax [phone]

## 2017-02-13 ENCOUNTER — Ambulatory Visit (INDEPENDENT_AMBULATORY_CARE_PROVIDER_SITE_OTHER): Payer: Medicare HMO | Admitting: Podiatry

## 2017-02-13 ENCOUNTER — Encounter: Payer: Self-pay | Admitting: Podiatry

## 2017-02-13 ENCOUNTER — Ambulatory Visit (INDEPENDENT_AMBULATORY_CARE_PROVIDER_SITE_OTHER): Payer: Medicare HMO

## 2017-02-13 DIAGNOSIS — M9262 Juvenile osteochondrosis of tarsus, left ankle: Secondary | ICD-10-CM | POA: Diagnosis not present

## 2017-02-13 DIAGNOSIS — M7752 Other enthesopathy of left foot: Secondary | ICD-10-CM | POA: Diagnosis not present

## 2017-02-13 DIAGNOSIS — M21619 Bunion of unspecified foot: Secondary | ICD-10-CM

## 2017-02-13 DIAGNOSIS — M7662 Achilles tendinitis, left leg: Secondary | ICD-10-CM

## 2017-02-13 DIAGNOSIS — L84 Corns and callosities: Secondary | ICD-10-CM

## 2017-02-13 DIAGNOSIS — M6788 Other specified disorders of synovium and tendon, other site: Secondary | ICD-10-CM

## 2017-02-16 MED ORDER — NONFORMULARY OR COMPOUNDED ITEM: 2 refills | Status: DC

## 2017-02-17 MED ORDER — BETAMETHASONE SOD PHOS & ACET 6 (3-3) MG/ML IJ SUSP: 3.0000 mg | Freq: Once | INTRAMUSCULAR | Status: DC

## 2017-02-17 NOTE — Progress Notes (Signed)
Patient ID: Karen [name], female   DOB: 1950/09/15, 66 y.o.   MRN: [id number]   HPI:  66 year old female presents the office today for follow-up evaluation regarding possible insertional Achilles tendon rupture to the left lower extremity. Patient states that her foot is still painful. The right foot is still swollen. She also complains of a callus lesion to the right plantar great toe that is symptomatic with ambulation and shoe gear. Patient had an MRI performed on 02/05/2017. Patient presents today to review MRI results and discuss further treatment options   Physical Exam: General: The patient is alert and oriented x3 in no acute distress.  Dermatology: Hyperkeratotic pre-ulcerative callus lesion noted to the plantar aspect of the right great toe. Skin is warm, dry and supple bilateral lower extremities. Negative for open lesions or macerations.  Vascular: Palpable pedal pulses bilaterally. No edema or erythema noted. Capillary refill within normal limits.  Neurological: Epicritic and protective threshold grossly intact bilaterally.   Musculoskeletal Exam: Large palpable mass noted to the posterior heel of the left lower extremity. This appears soft tissue in nature. There is diffuse edema noted throughout the posterior ankle joint. Muscle strength 5/5 in all muscle groups left lower extremity including plantar flexion. Achilles tendon appears to be intact. Hammertoe contracture deformity also noted digits 2-3 right foot with clinical evidence of bunion deformity right foot  MRI impression: 1. Significant distal Achilles tendinopathy with partial-thickness tearing of the deep fibers. 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 Associated reactive marrow edema in the calcaneus hand retrocalcaneal bursitis. 2. Intact medial and lateral ankle ligaments and tendons. 3. Hindfoot varus and subtalar and midfoot degenerative changes.  Assessment: 1. Achilles tendinopathy with  partial-thickness tearing left 2. Haglund's deformity left 3. 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 Hallux abductovalgus bunion deformity right foot   Plan of Care:  1. Patient was evaluated. MRI results were reviewed today 2. Today we discussed different conservative and surgical treatment options regarding the patient's pathology. At the moment we are going to continue conservative management. 3. Night splint dispensed to keep the Achilles tendon is stretched out position during healing 4. Injection of 0.5 mL Celestone Soluspan injected into the sub-Achilles bursa left lower extremity. Care was taken to avoid direct injection into the Achilles tendon 5. Continue immobilization cam boot or wedge sandal with a slight heel lift to offload the Achilles 6. Prescription for anti-plantar pain cream through [facility] 7. Excisional debridement of callus lesion to the right great toe was performed using a chisel blade without incident or bleeding 8. Return to clinic in 4 weeks  Patient going to [place] in September for a weekend    Edrick Kins, DPM [facility]  Dr. Edrick Kins, DPM    [address], [place] [address]                Office [phone]  Fax [phone]

## 2017-03-13 ENCOUNTER — Encounter: Payer: Self-pay | Admitting: Podiatry

## 2017-03-13 ENCOUNTER — Ambulatory Visit (INDEPENDENT_AMBULATORY_CARE_PROVIDER_SITE_OTHER): Payer: Medicare HMO | Admitting: Podiatry

## 2017-03-13 DIAGNOSIS — M7752 Other enthesopathy of left foot: Secondary | ICD-10-CM

## 2017-03-13 DIAGNOSIS — M6788 Other specified disorders of synovium and tendon, other site: Secondary | ICD-10-CM

## 2017-03-13 DIAGNOSIS — S91114A Laceration without foreign body of right lesser toe(s) without damage to nail, initial encounter: Secondary | ICD-10-CM | POA: Diagnosis not present

## 2017-03-13 DIAGNOSIS — M7662 Achilles tendinitis, left leg: Secondary | ICD-10-CM | POA: Diagnosis not present

## 2017-03-13 MED ORDER — GENTAMICIN SULFATE 0.1 % EX CREA: 1.0000 "application " | TOPICAL_CREAM | Freq: Three times a day (TID) | CUTANEOUS | 1 refills | Status: DC

## 2017-03-13 NOTE — Progress Notes (Signed)
Patient ID: Karen [name], female   DOB: 11-10-1950, 66 y.o.   MRN: [id number]   HPI:  66 year old female presents the office today for follow-up evaluation regarding left achilles tendinitis. She states the LLE is feeling much better. She reports she was diagnosed with bilateral DVTs about five days ago and is now taking Eliquis. She is also presenting with a new complaint of a laceration to the third right toe. She states she accidentally cut the skin while trying to trim the nail. She expresses concern that it may become infected.    Past Medical History:  Diagnosis Date  . Breast cancer, right ([place]) 2001   Right Lumpectomy and Rad tx's.  . Hypothyroidism   . Sleep apnea       Physical Exam: General: The patient is alert and oriented x3 in no acute distress.  Dermatology: Laceration noted to the 3rd toe distal tuft of the right foot approximately 0.5 cm. No exposed bone, muscle or tendon.  Vascular: Palpable pedal pulses bilaterally. No edema or erythema noted. Capillary refill within normal limits.  Neurological: Epicritic and protective threshold grossly intact bilaterally.   Musculoskeletal Exam: Large palpable mass noted to the posterior heel of the left lower extremity. This appears soft tissue in nature. There is diffuse edema noted throughout the posterior ankle joint. Muscle strength 5/5 in all muscle groups left lower extremity including plantar flexion. Achilles tendon appears to be intact. Hammertoe contracture deformity also noted digits 2-3 right foot with clinical evidence of bunion deformity right foot   Assessment: 1. Laceration right 3rd toe 2. Achilles tendinitis left   Plan of Care:  1. Patient was evaluated.  2. Injection of 0.5 mLs of Celestone Soluspan injected into the retrocalcaneal bursa. Care was taken to avoid direct injection into the tendon. 3. Prescription for Gentamycin cream given to patient.  4. Return to clinic when  necessary.     Edrick Kins, DPM [facility]  Dr. Edrick Kins, DPM    [address], [place] [address]                Office [phone]  Fax [phone]

## 2017-03-14 MED ORDER — BETAMETHASONE SOD PHOS & ACET 6 (3-3) MG/ML IJ SUSP: 3.0000 mg | Freq: Once | INTRAMUSCULAR | Status: DC

## 2017-03-26 ENCOUNTER — Encounter: Payer: Self-pay | Admitting: Student in an Organized Health Care Education/Training Program

## 2017-03-26 ENCOUNTER — Ambulatory Visit
Payer: Medicare HMO | Attending: Student in an Organized Health Care Education/Training Program | Admitting: Student in an Organized Health Care Education/Training Program

## 2017-03-26 VITALS — BP 139/86 | HR 97 | Temp 98.0°F | Resp 16 | Ht 68.0 in | Wt 200.0 lb

## 2017-03-26 DIAGNOSIS — I10 Essential (primary) hypertension: Secondary | ICD-10-CM | POA: Diagnosis not present

## 2017-03-26 DIAGNOSIS — Z6830 Body mass index (BMI) 30.0-30.9, adult: Secondary | ICD-10-CM | POA: Insufficient documentation

## 2017-03-26 DIAGNOSIS — E039 Hypothyroidism, unspecified: Secondary | ICD-10-CM | POA: Insufficient documentation

## 2017-03-26 DIAGNOSIS — M79604 Pain in right leg: Secondary | ICD-10-CM

## 2017-03-26 DIAGNOSIS — F329 Major depressive disorder, single episode, unspecified: Secondary | ICD-10-CM | POA: Diagnosis not present

## 2017-03-26 DIAGNOSIS — G2581 Restless legs syndrome: Secondary | ICD-10-CM | POA: Insufficient documentation

## 2017-03-26 DIAGNOSIS — M545 Low back pain: Secondary | ICD-10-CM | POA: Insufficient documentation

## 2017-03-26 DIAGNOSIS — Z79899 Other long term (current) drug therapy: Secondary | ICD-10-CM | POA: Insufficient documentation

## 2017-03-26 DIAGNOSIS — Z7901 Long term (current) use of anticoagulants: Secondary | ICD-10-CM | POA: Insufficient documentation

## 2017-03-26 DIAGNOSIS — G629 Polyneuropathy, unspecified: Secondary | ICD-10-CM | POA: Insufficient documentation

## 2017-03-26 DIAGNOSIS — M7660 Achilles tendinitis, unspecified leg: Secondary | ICD-10-CM | POA: Diagnosis not present

## 2017-03-26 DIAGNOSIS — M79605 Pain in left leg: Secondary | ICD-10-CM | POA: Diagnosis not present

## 2017-03-26 DIAGNOSIS — Z86718 Personal history of other venous thrombosis and embolism: Secondary | ICD-10-CM | POA: Diagnosis not present

## 2017-03-26 DIAGNOSIS — Z853 Personal history of malignant neoplasm of breast: Secondary | ICD-10-CM | POA: Insufficient documentation

## 2017-03-26 DIAGNOSIS — M25551 Pain in right hip: Secondary | ICD-10-CM | POA: Diagnosis not present

## 2017-03-26 DIAGNOSIS — M1990 Unspecified osteoarthritis, unspecified site: Secondary | ICD-10-CM | POA: Insufficient documentation

## 2017-03-26 DIAGNOSIS — M25552 Pain in left hip: Secondary | ICD-10-CM | POA: Insufficient documentation

## 2017-03-26 DIAGNOSIS — M7662 Achilles tendinitis, left leg: Secondary | ICD-10-CM | POA: Diagnosis not present

## 2017-03-26 DIAGNOSIS — G8929 Other chronic pain: Secondary | ICD-10-CM | POA: Insufficient documentation

## 2017-03-26 DIAGNOSIS — K589 Irritable bowel syndrome without diarrhea: Secondary | ICD-10-CM | POA: Diagnosis not present

## 2017-03-26 DIAGNOSIS — I825Y9 Chronic embolism and thrombosis of unspecified deep veins of unspecified proximal lower extremity: Secondary | ICD-10-CM

## 2017-03-26 DIAGNOSIS — M6788 Other specified disorders of synovium and tendon, other site: Secondary | ICD-10-CM

## 2017-03-26 DIAGNOSIS — E669 Obesity, unspecified: Secondary | ICD-10-CM | POA: Diagnosis not present

## 2017-03-26 MED ORDER — TIZANIDINE HCL 2 MG PO TABS: 2.0000 mg | ORAL_TABLET | Freq: Two times a day (BID) | ORAL | 1 refills | Status: DC | PRN

## 2017-03-26 NOTE — Patient Instructions (Signed)
It was nice meeting you today. Today we discussed the following  -increase your gabapentin next Monday so that you are taking 400 mg in the morning, 400 mg in the afternoon and 800 mg in the evening. If you have worsening lower extremity swelling this week, please do not increase your dose next Monday.  -tizanidine 2 mg up to twice a day for muscle spasms, muscle cramps.  -Referral to physical therapy. Recommend starting with aquatic therapy first and then land based therapy.  -Follow up in 4 weeks.

## 2017-03-26 NOTE — Progress Notes (Signed)
Safety precautions to be maintained throughout the outpatient stay will include: orient to surroundings, keep bed in low position, maintain call bell within reach at all times, provide assistance with transfer out of bed and ambulation.  

## 2017-03-26 NOTE — Progress Notes (Signed)
Patient's Name: Karen [name]  MRN: [id number]  Referring Provider: Vladimir Crofts, MD  DOB: 1950-08-03  PCP: Ricardo Jericho, NP  DOS: 03/26/2017  Note by: Gillis Santa, MD  Service setting: Ambulatory outpatient  Specialty: Interventional Pain Management  Location: [facility] (AMB) Pain Management Facility  Visit type: Initial Patient Evaluation  Patient type: New Patient   Primary Reason(s) for Visit: Encounter for initial evaluation of one or more chronic problems (new to examiner) potentially causing chronic pain, and posing a threat to normal musculoskeletal function. (Level of risk: High) CC: Back Pain (lower back ); Leg Pain (bilateral); and Hip Pain (bilateral)  HPI  Karen [name] is a 66 y.o. year old, female patient, who comes today to see [place] for the first time for an initial evaluation of her chronic pain. She has Adult BMI 35.0-35.9 kg/sq m; Arthritis; Neuropathy; Leg pain, bilateral; Achilles tendinosis; RLS (restless legs syndrome); and Chronic deep vein thrombosis (DVT) of proximal vein of lower extremity ([place]) on her problem list. Today she comes in for evaluation of her Back Pain (lower back ); Leg Pain (bilateral); and Hip Pain (bilateral)  Pain Assessment: Location: Right, Left (lower back and hips bilaterally ) Leg (back, hips, feet) Radiating: back pain goes into both hips Onset: More than a month ago Duration: Chronic pain Quality: Other (Comment), Aching, Stabbing, Tingling, Numbness, Restless, Constant Severity: 7 /10 (self-reported pain score)  Note: Reported level is compatible with observation.                   When using our objective Pain Scale, levels between 6 and 10/10 are said to belong in an emergency room, as it progressively worsens from a 6/10, described as severely limiting, requiring emergency care not usually available at an outpatient pain management facility. At a 6/10 level, communication becomes difficult and requires great effort. Assistance to  reach the emergency department may be required. Facial flushing and profuse sweating along with potentially dangerous increases in heart rate and blood pressure will be evident. Effect on ADL: tremendously.  patient has not been able to exercise, activities have to be limited to a small time.  Timing: Constant Modifying factors: sometimes walking, tylenol  Onset and Duration: Gradual and Present longer than 3 months Cause of pain: Unknown Severity: Getting worse, NAS-11 at its worse: 10/10, NAS-11 at its best: 6/10, NAS-11 now: 8/10 and NAS-11 on the average: 7/10 Timing: Night, During activity or exercise and After a period of immobility Aggravating Factors: Bending, Kneeling, Motion, Prolonged sitting, Prolonged standing, Stooping  and Walking uphill Alleviating Factors: Cold packs and Resting Associated Problems: Night-time cramps, Fatigue, Inability to concentrate, Numbness, Swelling, Tingling, Pain that wakes patient up and Pain that does not allow patient to sleep Quality of Pain: Aching, Agonizing, Annoying, Burning, Cramping, Disabling, Distressing, Exhausting, Feeling of weight, Nagging, Punishing, Sharp, Shooting, Stabbing, Throbbing, Tingling and Uncomfortable Previous Examinations or Tests: CT scan Previous Treatments: Chiropractic manipulations  The patient comes into the clinics today for the first time for a chronic pain management evaluation.   66 year old female with a history of right breast cancer, osteoporosis, depression, hypertension, restless leg, IBS, obesity who presents with bilateral lower extremity pain, bilateral hip pain, bilateral foot pain. Patient does have a history of idiopathic peripheral neuropathy confirmed by EMG and nerve conduction studies. She has tried Lyrica for her neuropathy which resulted in lower extremity swelling. She is currently on gabapentin 400 mg 3 times a day.  Patient also has a  history of DVT of bilateral lower extremities which was  diagnosed in September. Patient is anticoagulated with apixaban given her bilateral lower extremity DVTs.  Patient also has a history of narcolepsy. She finds it very difficult to remain asleep at night. She will wake up approximately after one hour. Patient does fall asleep easily during the day. She is no longer driving given daytime somnolence from narcolepsy.  Because interventional pain management is my board-certified specialty, the patient was informed that joining my practice means that they are open to any and all interventional therapies. I made it clear that this does not mean that they will be forced to have any procedures done. What this means is that I believe interventional therapies to be essential part of the diagnosis and proper management of chronic pain conditions. Therefore, patients not interested in these interventional alternatives will be better served under the care of a different practitioner.  The patient was also made aware of my Comprehensive Pain Management Safety Guidelines where by joining my practice, they limit all of their nerve blocks and joint injections to those done by our practice, for as long as we are retained to manage their care.   Historic Controlled Substance Pharmacotherapy Review  PMP and historical list of controlled substances: tramadol 50 mg, quantity 6, last fill 03/08/2017 MME/day: 0 mg/day Medications: The patient did not bring the medication(s) to the appointment, as requested in our "New Patient Package" Pharmacodynamics: Desired effects: Analgesia: The patient reports >50% benefit. Reported improvement in function: The patient reports medication allows her to accomplish basic ADLs. Clinically meaningful improvement in function (CMIF): Sustained CMIF goals met Perceived effectiveness: Described as relatively effective, allowing for increase in activities of daily living (ADL) Undesirable effects: Side-effects or Adverse reactions: None  reported Historical Monitoring: The patient  reports that she does not use drugs. List of all UDS Test(s): No results found for: MDMA, COCAINSCRNUR, PCPSCRNUR, PCPQUANT, CANNABQUANT, THCU, [place] List of all Serum Drug Screening Test(s):  No results found for: AMPHSCRSER, BARBSCRSER, BENZOSCRSER, COCAINSCRSER, PCPSCRSER, PCPQUANT, THCSCRSER, CANNABQUANT, OPIATESCRSER, OXYSCRSER, PROPOXSCRSER Historical Background Evaluation: [place] PMP: Six (6) year initial data search conducted.             PMP NARX Score Report:  Narcotic: :230 Sedative: 240 Stimulant: 0 [place] Department of public safety, offender search: Editor, commissioning Information) Non-contributory Risk Assessment Profile: Aberrant behavior: None observed or detected today Risk factors for fatal opioid overdose: None identified today PMP NARX Overdose Risk Score: 410 Fatal overdose hazard ratio (HR): Calculation deferred Non-fatal overdose hazard ratio (HR): Calculation deferred Risk of opioid abuse or dependence: 0.7-3.0% with doses ? 36 MME/day and 6.1-26% with doses ? 120 MME/day. Substance use disorder (SUD) risk level: Pending results of Medical Psychology Evaluation for SUD Opioid risk tool (ORT) (Total Score): 1     Opioid Risk Tool - 03/26/17 1402      Family History of Substance Abuse   Alcohol Positive Female   Illegal Drugs Negative   Rx Drugs Negative     Personal History of Substance Abuse   Alcohol Negative   Illegal Drugs Negative   Rx Drugs Negative     Psychological Disease   Psychological Disease Negative   Depression Negative     Total Score   Opioid Risk Tool Scoring 1   Opioid Risk Interpretation Low Risk     ORT Scoring interpretation table:  Score <3 = Low Risk for SUD  Score between 4-7 = Moderate Risk for SUD  Score >8 =  High Risk for Opioid Abuse     Pharmacologic Plan: Pending ordered tests and/or consults            Initial impression: Pending review of available data and ordered tests.  Meds    Current Outpatient Prescriptions:  .  apixaban (ELIQUIS) 5 MG TABS tablet, Take by mouth., Disp: , Rfl:  .  gabapentin (NEURONTIN) 400 MG capsule, Take 400 mg by mouth 3 (three) times daily., Disp: , Rfl: 11 .  gentamicin cream (GARAMYCIN) 0.1 %, Apply 1 application topically 3 (three) times daily., Disp: 30 g, Rfl: 1 .  levothyroxine (SYNTHROID, LEVOTHROID) 75 MCG tablet, TAKE 1 TABLET EVERY DAY ON AN EMPTY STOMACH WITH WATER 30-60 MINUTES BEFORE BREAKFAST, Disp: , Rfl:  .  lisinopril (PRINIVIL,ZESTRIL) 20 MG tablet, Take by mouth., Disp: , Rfl:  .  NONFORMULARY OR COMPOUNDED ITEM, See pharmacy note, Disp: 120 each, Rfl: 2 .  Pramipexole Dihydrochloride 1.5 MG TB24, Take by mouth., Disp: , Rfl:  .  diclofenac (VOLTAREN) 75 MG EC tablet, Take 1 tablet (75 mg total) by mouth 2 (two) times daily. (Patient not taking: Reported on 03/26/2017), Disp: 60 tablet, Rfl: 0 .  gabapentin (NEURONTIN) 300 MG capsule, Take 400 mg by mouth 3 (three) times daily. , Disp: , Rfl:  .  HYDROcodone-acetaminophen (NORCO/VICODIN) 5-325 MG tablet, Take 1 tablet by mouth every 6 (six) hours as needed for moderate pain. (Patient not taking: Reported on 03/26/2017), Disp: 30 tablet, Rfl: 0 .  hyoscyamine (LEVBID) 0.375 MG 12 hr tablet, Take by mouth., Disp: , Rfl:  .  Omega-3 Fatty Acids (FISH OIL) 1000 MG CAPS, Take 1,000 mg by mouth daily., Disp: , Rfl:  .  pramipexole (MIRAPEX) 1 MG tablet, Take 1 mg by mouth 3 (three) times daily., Disp: , Rfl:  .  pregabalin (LYRICA) 100 MG capsule, Take by mouth., Disp: , Rfl:  .  spironolactone (ALDACTONE) 50 MG tablet, Take 50 mg by mouth daily., Disp: , Rfl: 98 .  tiZANidine (ZANAFLEX) 2 MG tablet, Take 1 tablet (2 mg total) by mouth 2 (two) times daily as needed for muscle spasms., Disp: 60 tablet, Rfl: 1 .  traMADol (ULTRAM) 50 MG tablet, , Disp: , Rfl:   Current Facility-Administered Medications:  .  betamethasone acetate-betamethasone sodium phosphate (CELESTONE) injection 3  mg, 3 mg, Intramuscular, Once, Evans, Brent M, DPM .  betamethasone acetate-betamethasone sodium phosphate (CELESTONE) injection 3 mg, 3 mg, Intramuscular, Once, Felecia Shelling, DPM  Imaging Review  MRI Heel IMPRESSION: 1. Significant distal Achilles tendinopathy with partial-thickness tearing of the deep fibers. 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 Associated reactive marrow edema in the calcaneus hand retrocalcaneal bursitis. 2. Intact medial and lateral ankle ligaments and tendons. 3. Hindfoot varus and subtalar and midfoot degenerative changes.  Complexity Note: Imaging results reviewed. Results shared with Karen [name], using Layman's terms.                         ROS  Cardiovascular History: High blood pressure, Heart catheterization and Blood thinners:  Anticoagulant Pulmonary or Respiratory History: Snoring  and Temporary stoppage of breathing during sleep Neurological History: Abnormal skin sensations (Peripheral Neuropathy) Review of Past Neurological Studies: No results found for this or any previous visit. Psychological-Psychiatric History: Anxiousness, Depressed, Prone to panicking and Difficulty sleeping and or falling asleep Gastrointestinal History: Alternating episodes iof diarrhea and constipation (IBS-Irritable bowe syndrome) Genitourinary History: No reported renal or genitourinary signs or symptoms such as difficulty  voiding or producing urine, peeing blood, non-functioning kidney, kidney stones, difficulty emptying the bladder, difficulty controlling the flow of urine, or chronic kidney disease Hematological History: Coagulation disorder and Positive for taking the following blood thinner: Eliquis Endocrine History: Slow thyroid Rheumatologic History: Joint aches and or swelling due to excess weight (Osteoarthritis) Musculoskeletal History: Negative for myasthenia gravis, muscular dystrophy, multiple sclerosis or malignant hyperthermia Work History:  Retired  Allergies  Karen [name] has No Known Allergies.  Laboratory Chemistry  Inflammation Markers (CRP: Acute Phase) (ESR: Chronic Phase) No results found for: CRP, ESRSEDRATE               Renal Function Markers Lab Results  Component Value Date   BUN 10 02/05/2015   CREATININE 0.61 02/05/2015   GFRAA >60 02/05/2015   GFRNONAA >60 02/05/2015                 Hepatic Function Markers No results found for: AST, ALT, ALBUMIN, ALKPHOS, HCVAB               Electrolytes Lab Results  Component Value Date   NA 138 02/05/2015   K 3.8 02/05/2015   CL 101 02/05/2015   CALCIUM 9.4 02/05/2015                 Neuropathy Markers No results found for: [id number]               Bone Pathology Markers Lab Results  Component Value Date   CALCIUM 9.4 02/05/2015                 Coagulation Parameters Lab Results  Component Value Date   PLT 184 02/05/2015                 Cardiovascular Markers Lab Results  Component Value Date   HGB 13.1 02/05/2015   HCT 40.2 02/05/2015                 Note: Lab results reviewed.  PFSH  Drug: Karen [name]  reports that she does not use drugs. Alcohol:  reports that she drinks alcohol. Tobacco:  reports that she has never smoked. She has never used smokeless tobacco. Medical:  has a past medical history of Breast cancer, right (HCC) (2001); Hypothyroidism; and Sleep apnea. Family: family history includes Cancer in her mother and sister; Depression in her father; Emphysema in her father; Heart disease in her father; Hypertension in her father, mother, and sister.  Past Surgical History:  Procedure Laterality Date  . ABDOMINAL HYSTERECTOMY    . APPENDECTOMY    . BREAST SURGERY    . CARDIAC CATHETERIZATION N/A 11/25/2014   Procedure: Left Heart Cath;  Surgeon: Dalia Heading, MD;  Location: [facility] INVASIVE CV LAB;  Service: Cardiovascular;  Laterality: N/A;  . CHOLECYSTECTOMY     Active Ambulatory Problems    Diagnosis Date Noted  . Adult  BMI 35.0-35.9 kg/sq m 12/24/2014  . Arthritis 03/26/2017  . Neuropathy 03/26/2017  . Leg pain, bilateral 03/26/2017  . Achilles tendinosis 03/26/2017  . RLS (restless legs syndrome) 03/26/2017  . Chronic deep vein thrombosis (DVT) of proximal vein of lower extremity (HCC) 03/26/2017   Resolved Ambulatory Problems    Diagnosis Date Noted  . No Resolved Ambulatory Problems   Past Medical History:  Diagnosis Date  . Breast cancer, right (HCC) 2001  . Hypothyroidism   . Sleep apnea    Constitutional Exam  General appearance: Well nourished, well developed, and well  hydrated. In no apparent acute distress Vitals:   03/26/17 1352  BP: 139/86  Pulse: 97  Resp: 16  Temp: 98 F (36.7 C)  TempSrc: Oral  SpO2: 99%  Weight: 200 lb (90.7 kg)  Height: '5\' 8"'$  (1.727 m)   BMI Assessment: Estimated body mass index is 30.41 kg/m as calculated from the following:   Height as of this encounter: '5\' 8"'$  (1.727 m).   Weight as of this encounter: 200 lb (90.7 kg).  BMI interpretation table: BMI level Category Range association with higher incidence of chronic pain  <18 kg/m2 Underweight   18.5-24.9 kg/m2 Ideal body weight   25-29.9 kg/m2 Overweight Increased incidence by 20%  30-34.9 kg/m2 Obese (Class I) Increased incidence by 68%  35-39.9 kg/m2 Severe obesity (Class II) Increased incidence by 136%  >40 kg/m2 Extreme obesity (Class III) Increased incidence by 254%   BMI Readings from Last 4 Encounters:  03/26/17 30.41 kg/m  02/05/15 36.18 kg/m  11/25/14 34.06 kg/m   Wt Readings from Last 4 Encounters:  03/26/17 200 lb (90.7 kg)  02/05/15 231 lb (104.8 kg)  11/25/14 224 lb (101.6 kg)  Psych/Mental status: Alert, oriented x 3 (person, place, & time)       Eyes: PERLA Respiratory: No evidence of acute respiratory distress  Cervical Spine Area Exam  Skin & Axial Inspection: No masses, redness, edema, swelling, or associated skin lesions Alignment: Symmetrical Functional ROM:  Unrestricted ROM      Stability: No instability detected Muscle Tone/Strength: Functionally intact. No obvious neuro-muscular anomalies detected. Sensory (Neurological): Unimpaired Palpation: No palpable anomalies              Upper Extremity (UE) Exam    Side: Right upper extremity  Side: Left upper extremity  Skin & Extremity Inspection: Skin color, temperature, and hair growth are WNL. No peripheral edema or cyanosis. No masses, redness, swelling, asymmetry, or associated skin lesions. No contractures.  Skin & Extremity Inspection: Skin color, temperature, and hair growth are WNL. No peripheral edema or cyanosis. No masses, redness, swelling, asymmetry, or associated skin lesions. No contractures.  Functional ROM: Unrestricted ROM          Functional ROM: Unrestricted ROM          Muscle Tone/Strength: Functionally intact. No obvious neuro-muscular anomalies detected.  Muscle Tone/Strength: Functionally intact. No obvious neuro-muscular anomalies detected.  Sensory (Neurological): Unimpaired          Sensory (Neurological): Unimpaired          Palpation: No palpable anomalies              Palpation: No palpable anomalies              Specialized Test(s): Deferred         Specialized Test(s): Deferred          Thoracic Spine Area Exam  Skin & Axial Inspection: No masses, redness, or swelling Alignment: Symmetrical Functional ROM: Unrestricted ROM Stability: No instability detected Muscle Tone/Strength: Functionally intact. No obvious neuro-muscular anomalies detected. Sensory (Neurological): Unimpaired Muscle strength & Tone: No palpable anomalies  Lumbar Spine Area Exam  Skin & Axial Inspection: No masses, redness, or swelling Alignment: Symmetrical Functional ROM: Unrestricted ROM      Stability: No instability detected Muscle Tone/Strength: Functionally intact. No obvious neuro-muscular anomalies detected. Sensory (Neurological): Unimpaired Palpation: No palpable anomalies        Provocative Tests: Lumbar Hyperextension and rotation test: Positive bilaterally for facet joint pain. Lumbar Lateral bending test:  evaluation deferred today       Patrick's Maneuver: evaluation deferred today                    Gait & Posture Assessment  Ambulation: Limited Gait: Antalgic Posture: WNL   Lower Extremity Exam    Side: Right lower extremity  Side: Left lower extremity  Skin & Extremity Inspection: Edema  Skin & Extremity Inspection: Edema  Functional ROM: Unrestricted ROM          Functional ROM: Unrestricted ROM          Muscle Tone/Strength: Functionally intact. No obvious neuro-muscular anomalies detected.  Muscle Tone/Strength: Functionally intact. No obvious neuro-muscular anomalies detected.  Sensory (Neurological): Paresthesia (Burning sensation)diminished sensation along medial malleolus  Sensory (Neurological): Paresthesia (Burning sensation)diminished sensation along medial malleolus  Palpation: Tender  Palpation: Tender   Assessment  Primary Diagnosis & Pertinent Problem List: The primary encounter diagnosis was Neuropathy. Diagnoses of Leg pain, bilateral, Achilles tendinosis, RLS (restless legs syndrome), Chronic deep vein thrombosis (DVT) of proximal vein of lower extremity, unspecified laterality ([place]), and Pain of both hip joints were also pertinent to this visit.  Visit Diagnosis (New problems to examiner): 1. Neuropathy   2. Leg pain, bilateral   3. Achilles tendinosis   4. RLS (restless legs syndrome)   5. Chronic deep vein thrombosis (DVT) of proximal vein of lower extremity, unspecified laterality (HCC)   6. Pain of both hip joints    Plan of Care (Initial workup plan)    General Recommendations: The pain condition that the patient suffers from is best treated with a multidisciplinary approach that involves an increase in physical activity to prevent de-conditioning and worsening of the pain cycle, as well as psychological counseling (formal  and/or informal) to address the co-morbid psychological affects of pain. Treatment will often involve judicious use of pain medications and interventional procedures to decrease the pain, allowing the patient to participate in the physical activity that will ultimately produce long-lasting pain reductions. The goal of the multidisciplinary approach is to return the patient to a higher level of overall function and to restore their ability to perform activities of daily living.  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 Patient has a history of DVT and is currently anticoagulated on apixaban. Not a candidate for any interventional procedures. At this point we will try and manage her pain with non-opioid analgesics, physical therapy, psychotherapy given her symptoms of depression. I discussed with her that opioid analgesics may not be a good therapeutic option for her given their ineffectiveness in the past and her history of narcolepsy. Patient also has obstructive sleep apnea, another reason why opioid therapy is not a good choice for her for chronic pain.   Patient has been on gabapentin which was recently increased from 300 3 times a day to 400 mg 3 times a day. Patient is not endorsing any significant benefits but is not having any side effects either. I have encouraged her to increase her nighttime dose next week so that she is taking 400 mg, 400 mg, 800 mg daily at bedtime. This may also assist with her sleep at night. Patient also endorses muscle spasms in her lumbar spine and legs. I will prescribe her tizanidine 2 mg twice a day when necessary for muscle spasms. I will also provide the patient a referral to physical therapy. She may benefit from  starting with aquatic therapy first and then trying land-based therapy for lumbar paraspinal muscle  strengthening and range of motion exercises.  Plan: -Increase gabapentin in 1 week so that you are taking 400 mg, 400 mg, 800 mg qhs -tizanidine 2 mg twice a day when necessary muscle spasms, Prescription provided -Referral to physical therapy, recommend starting with aquatic therapy first -Follow up in one month   Future: -Consider tricyclic antidepressant or Cymbalta if up titration of gabapentin is not effective.  Ordered Lab-work, Procedure(s), Referral(s), & Consult(s): Orders Placed This Encounter  Procedures  . Ambulatory referral to Physical Therapy   Pharmacotherapy (current): Medications ordered:  Meds ordered this encounter  Medications  . tiZANidine (ZANAFLEX) 2 MG tablet    Sig: Take 1 tablet (2 mg total) by mouth 2 (two) times daily as needed for muscle spasms.    Dispense:  60 tablet    Refill:  1   Medications administered during this visit: Karen [name] had no medications administered during this visit.   Pharmacological management options:  Opioid Analgesics: we'll avoid opioid therapy  Membrane stabilizer: lower extremity swelling with Lyrica. Currently on gabapentin. Can consider Topamax.  Muscle relaxant: trial of tizanidine. Can consider baclofen, Robaxin.  NSAID: To be determined at a later time  Other analgesic(s):-Consider tricyclic antidepressant or Cymbalta if up titration of gabapentin is not effective.   Interventional management options: Karen [name] was informed that there is no guarantee that she would be a candidate for interventional therapies. The decision will be based on the results of diagnostic studies, as well as Karen [name]'s risk profile.  Procedure(s) under consideration:  -anticoagulated with apixaban, procedures not an option at the moment   Provider-requested follow-up: No Follow-up on file.  Future Appointments Date Time Provider [place]  04/24/2017 10:30 AM Gillis Santa, MD [facility] None    Primary Care Physician:  Ricardo Jericho, NP Location: [facility] Note by: Gillis Santa, M.D, Date: 03/26/2017; Time: 4:30 PM  Patient Instructions  It was nice meeting you today. Today we discussed the following  -increase your gabapentin next Monday so that you are taking 400 mg in the morning, 400 mg in the afternoon and 800 mg in the evening. If you have worsening lower extremity swelling this week, please do not increase your dose next Monday.  -tizanidine 2 mg up to twice a day for muscle spasms, muscle cramps.  -Referral to physical therapy. Recommend starting with aquatic therapy first and then land based therapy.  -Follow up in 4 weeks.

## 2017-03-30 ENCOUNTER — Other Ambulatory Visit: Payer: Self-pay | Admitting: Gastroenterology

## 2017-03-30 DIAGNOSIS — R1084 Generalized abdominal pain: Secondary | ICD-10-CM

## 2017-04-03 ENCOUNTER — Ambulatory Visit
Admission: RE | Admit: 2017-04-03 | Discharge: 2017-04-03 | Disposition: A | Discharge status: Home / Self Care | Payer: Medicare HMO | Source: Ambulatory Visit | Attending: Gastroenterology | Admitting: Gastroenterology

## 2017-04-03 DIAGNOSIS — K5730 Diverticulosis of large intestine without perforation or abscess without bleeding: Secondary | ICD-10-CM | POA: Insufficient documentation

## 2017-04-03 DIAGNOSIS — I700 Atherosclerosis of aorta: Secondary | ICD-10-CM | POA: Diagnosis not present

## 2017-04-03 DIAGNOSIS — R1084 Generalized abdominal pain: Secondary | ICD-10-CM | POA: Insufficient documentation

## 2017-04-03 DIAGNOSIS — R197 Diarrhea, unspecified: Secondary | ICD-10-CM | POA: Diagnosis not present

## 2017-04-03 HISTORY — DX: Essential (primary) hypertension: I10

## 2017-04-03 MED ORDER — IOPAMIDOL (ISOVUE-300) INJECTION 61%
100.0000 mL | Freq: Once | INTRAVENOUS | Status: AC | PRN
  Administered 2017-04-03: 100 mL via INTRAVENOUS

## 2017-04-24 ENCOUNTER — Ambulatory Visit
Payer: Medicare HMO | Attending: Student in an Organized Health Care Education/Training Program | Admitting: Student in an Organized Health Care Education/Training Program

## 2017-04-24 ENCOUNTER — Encounter: Payer: Self-pay | Admitting: Student in an Organized Health Care Education/Training Program

## 2017-04-24 VITALS — BP 133/88 | HR 74 | Temp 98.0°F | Resp 18 | Ht 67.0 in | Wt 210.0 lb

## 2017-04-24 DIAGNOSIS — Z79899 Other long term (current) drug therapy: Secondary | ICD-10-CM | POA: Insufficient documentation

## 2017-04-24 DIAGNOSIS — I825Y9 Chronic embolism and thrombosis of unspecified deep veins of unspecified proximal lower extremity: Secondary | ICD-10-CM | POA: Diagnosis not present

## 2017-04-24 DIAGNOSIS — Z818 Family history of other mental and behavioral disorders: Secondary | ICD-10-CM | POA: Insufficient documentation

## 2017-04-24 DIAGNOSIS — E039 Hypothyroidism, unspecified: Secondary | ICD-10-CM | POA: Diagnosis not present

## 2017-04-24 DIAGNOSIS — Z853 Personal history of malignant neoplasm of breast: Secondary | ICD-10-CM | POA: Diagnosis not present

## 2017-04-24 DIAGNOSIS — G2581 Restless legs syndrome: Secondary | ICD-10-CM | POA: Insufficient documentation

## 2017-04-24 DIAGNOSIS — M7660 Achilles tendinitis, unspecified leg: Secondary | ICD-10-CM

## 2017-04-24 DIAGNOSIS — G4730 Sleep apnea, unspecified: Secondary | ICD-10-CM | POA: Insufficient documentation

## 2017-04-24 DIAGNOSIS — Z5181 Encounter for therapeutic drug level monitoring: Secondary | ICD-10-CM | POA: Diagnosis not present

## 2017-04-24 DIAGNOSIS — M79605 Pain in left leg: Secondary | ICD-10-CM | POA: Diagnosis not present

## 2017-04-24 DIAGNOSIS — Z8249 Family history of ischemic heart disease and other diseases of the circulatory system: Secondary | ICD-10-CM | POA: Diagnosis not present

## 2017-04-24 DIAGNOSIS — M79604 Pain in right leg: Secondary | ICD-10-CM | POA: Insufficient documentation

## 2017-04-24 DIAGNOSIS — M7662 Achilles tendinitis, left leg: Secondary | ICD-10-CM | POA: Diagnosis not present

## 2017-04-24 DIAGNOSIS — Z79891 Long term (current) use of opiate analgesic: Secondary | ICD-10-CM | POA: Diagnosis not present

## 2017-04-24 DIAGNOSIS — I10 Essential (primary) hypertension: Secondary | ICD-10-CM | POA: Insufficient documentation

## 2017-04-24 DIAGNOSIS — M6788 Other specified disorders of synovium and tendon, other site: Secondary | ICD-10-CM

## 2017-04-24 DIAGNOSIS — G629 Polyneuropathy, unspecified: Secondary | ICD-10-CM | POA: Diagnosis not present

## 2017-04-24 DIAGNOSIS — M2550 Pain in unspecified joint: Secondary | ICD-10-CM | POA: Diagnosis present

## 2017-04-24 MED ORDER — GABAPENTIN 400 MG PO CAPS: ORAL_CAPSULE | ORAL | 3 refills | Status: DC

## 2017-04-24 NOTE — Patient Instructions (Signed)
1. Increase Gabapentin 400/800/800 for 2 weeks and then 800 mg three time a day

## 2017-04-24 NOTE — Progress Notes (Signed)
Patient's Name: Karen [name]  MRN: [id number]  Referring Provider: Ricardo Jericho*  DOB: 1950/12/11  PCP: Ricardo Jericho, NP  DOS: 04/24/2017  Note by: Gillis Santa, MD  Service setting: Ambulatory outpatient  Specialty: Interventional Pain Management  Location: [facility] (AMB) Pain Management Facility    Patient type: Established   Primary Reason(s) for Visit: Encounter for prescription drug management. (Level of risk: moderate)  CC: Joint Pain  HPI  Karen [name] is a 66 y.o. year old, female patient, who comes today for a medication management evaluation. She has Adult BMI 35.0-35.9 kg/sq m; Arthritis; Neuropathy; Leg pain, bilateral; Achilles tendinosis; RLS (restless legs syndrome); and Chronic deep vein thrombosis (DVT) of proximal vein of lower extremity (HCC) on their problem list. Her primarily concern today is the Joint Pain  Pain Assessment: Location:   (joint pain) Radiating:   Onset: More than a month ago Duration: Chronic pain Quality: Aching Severity: 2 /10 (self-reported pain score)  Note: Reported level is compatible with observation.                         When using our objective Pain Scale, levels between 6 and 10/10 are said to belong in an emergency room, as it progressively worsens from a 6/10, described as severely limiting, requiring emergency care not usually available at an outpatient pain management facility. At a 6/10 level, communication becomes difficult and requires great effort. Assistance to reach the emergency department may be required. Facial flushing and profuse sweating along with potentially dangerous increases in heart rate and blood pressure will be evident. Effect on ADL:   Timing: Constant Modifying factors: meds  Karen [name] was last scheduled for an appointment on 03/26/2017 for medication management. During today's appointment we reviewed Karen [name]'s chronic pain status, as well as her outpatient medication regimen.  The  patient  reports that she does not use drugs. Her body mass index is 32.89 kg/m.  Further details on both, my assessment(s), as well as the proposed treatment plan, please see below.  Controlled Substance Pharmacotherapy Assessment REMS (Risk Evaluation and Mitigation Strategy)  Analgesic: Non opioid management MME/day: 0 mg/day.  No notes on file  Risk Assessment Profile: Aberrant behavior: See prior evaluations. None observed or detected today Comorbid factors increasing risk of overdose: See prior notes. No additional risks detected today Risk of substance use disorder (SUD): Low Opioid Risk Tool - 03/26/17 1402      Family History of Substance Abuse   Alcohol  Positive Female    Illegal Drugs  Negative    Rx Drugs  Negative      Personal History of Substance Abuse   Alcohol  Negative    Illegal Drugs  Negative    Rx Drugs  Negative      Psychological Disease   Psychological Disease  Negative    Depression  Negative      Total Score   Opioid Risk Tool Scoring  1    Opioid Risk Interpretation  Low Risk      ORT Scoring interpretation table:  Score <3 = Low Risk for SUD  Score between 4-7 = Moderate Risk for SUD  Score >8 = High Risk for Opioid Abuse   Risk Mitigation Strategies:  Patient Counseling: Covered Patient-Prescriber Agreement (PPA): Present and active  Notification to other healthcare providers: Done  Pharmacologic Plan: Increase gabapentin dose as detailed below.  Patient finding gabapentin effective.  Not endorsing any side effects  of sedation or lower extremity swelling.  Laboratory Chemistry  Inflammation Markers (CRP: Acute Phase) (ESR: Chronic Phase) No results found for: CRP, ESRSEDRATE               Renal Function Markers Lab Results  Component Value Date   BUN 10 02/05/2015   CREATININE 0.61 02/05/2015   GFRAA >60 02/05/2015   GFRNONAA >60 02/05/2015                 Hepatic Function Markers No results found for: AST, ALT, ALBUMIN,  ALKPHOS, HCVAB               Electrolytes Lab Results  Component Value Date   NA 138 02/05/2015   K 3.8 02/05/2015   CL 101 02/05/2015   CALCIUM 9.4 02/05/2015                 Neuropathy Markers No results found for: [id number]               Bone Pathology Markers Lab Results  Component Value Date   CALCIUM 9.4 02/05/2015                 Coagulation Parameters Lab Results  Component Value Date   PLT 184 02/05/2015                 Cardiovascular Markers Lab Results  Component Value Date   HGB 13.1 02/05/2015   HCT 40.2 02/05/2015                 Note: Lab results reviewed.  Recent Diagnostic Imaging Results  CT ABDOMEN PELVIS W CONTRAST Addendum: ADDENDUM REPORT: 04/03/2017 16:00   ADDENDUM:  8 x 2 x 4 cm lens shaped lesion lateral to the left hip musculature  is compatible with chronic coalescence of the large hematoma seen at  this location on the prior CT study of 11/05/2013.   Electronically Signed    By: Misty Stanley M.D.    On: 04/03/2017 16:00 Narrative: CLINICAL DATA:  Generalized abdominal pain with bloating and diarrhea. History of breast cancer.  EXAM: CT ABDOMEN AND PELVIS WITH CONTRAST  TECHNIQUE: Multidetector CT imaging of the abdomen and pelvis was performed using the standard protocol following bolus administration of intravenous contrast.  CONTRAST:  175m ISOVUE-300 IOPAMIDOL (ISOVUE-300) INJECTION 61%  COMPARISON:  None.  FINDINGS: Lower chest:  Unremarkable  Hepatobiliary: No focal abnormality within the liver parenchyma. There is no evidence for gallstones, gallbladder wall thickening, or pericholecystic fluid. No intrahepatic or extrahepatic biliary dilation.  Pancreas: No focal mass lesion. No dilatation of the main duct. No intraparenchymal cyst. No peripancreatic edema.  Spleen: No splenomegaly. No focal mass lesion.  Adrenals/Urinary Tract: No adrenal nodule or mass. Right kidney unremarkable. 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 No evidence for hydroureter. Bladder is decompressed.  Stomach/Bowel: Stomach is nondistended. No gastric wall thickening. No evidence of outlet obstruction. Duodenum is normally positioned as is the ligament of Treitz. Duodenal diverticulum evident. No small bowel wall thickening. No small bowel dilatation. The terminal ileum is normal. Nonvisualization of the appendix is consistent with the reported history of appendectomy. Diverticular changes are noted in the left colon without evidence of diverticulitis.  Vascular/Lymphatic: There is abdominal aortic atherosclerosis without aneurysm. There is no gastrohepatic or hepatoduodenal ligament lymphadenopathy. No intraperitoneal or retroperitoneal lymphadenopathy. No pelvic sidewall lymphadenopathy.  Reproductive: Uterus surgically absent.  There is no adnexal mass.  Other: No intraperitoneal  free fluid.  Musculoskeletal: Bone windows reveal no worrisome lytic or sclerotic osseous lesions.  IMPRESSION: 1. No acute findings in the abdomen or pelvis. No features to explain the patient's history of abdominal pain with bloating and diarrhea. 2. Left colonic diverticulosis without diverticulitis. 3.  Aortic Atherosclerois (ICD10-170.0)  Electronically Signed: By: Misty Stanley M.D. On: 04/03/2017 12:06  Complexity Note: Imaging results reviewed. Results shared with Ms. Pollet, using Layman's terms.                         Meds   Current Outpatient Medications:  .  apixaban (ELIQUIS) 5 MG TABS tablet, Take by mouth., Disp: , Rfl:  .  gabapentin (NEURONTIN) 400 MG capsule, 400/800/800 for 2 weeks and then 800 mg three times a day, Disp: 180 capsule, Rfl: 3 .  levothyroxine (SYNTHROID, LEVOTHROID) 75 MCG tablet, TAKE 1 TABLET EVERY DAY ON AN EMPTY STOMACH WITH WATER 30-60 MINUTES BEFORE BREAKFAST, Disp: , Rfl:  .  lisinopril (PRINIVIL,ZESTRIL) 20 MG tablet, Take by  mouth., Disp: , Rfl:  .  NONFORMULARY OR COMPOUNDED ITEM, See pharmacy note, Disp: 120 each, Rfl: 2 .  pramipexole (MIRAPEX) 1 MG tablet, Take 1 mg by mouth 3 (three) times daily., Disp: , Rfl:  .  tiZANidine (ZANAFLEX) 2 MG tablet, Take 1 tablet (2 mg total) by mouth 2 (two) times daily as needed for muscle spasms., Disp: 60 tablet, Rfl: 1 .  diclofenac (VOLTAREN) 75 MG EC tablet, Take 1 tablet (75 mg total) by mouth 2 (two) times daily. (Patient not taking: Reported on 04/24/2017), Disp: 60 tablet, Rfl: 0 .  gentamicin cream (GARAMYCIN) 0.1 %, Apply 1 application topically 3 (three) times daily. (Patient not taking: Reported on 04/24/2017), Disp: 30 g, Rfl: 1 .  HYDROcodone-acetaminophen (NORCO/VICODIN) 5-325 MG tablet, Take 1 tablet by mouth every 6 (six) hours as needed for moderate pain. (Patient not taking: Reported on 03/26/2017), Disp: 30 tablet, Rfl: 0 .  hyoscyamine (LEVBID) 0.375 MG 12 hr tablet, Take by mouth., Disp: , Rfl:  .  Omega-3 Fatty Acids (FISH OIL) 1000 MG CAPS, Take 1,000 mg by mouth daily., Disp: , Rfl:  .  Pramipexole Dihydrochloride 1.5 MG TB24, Take by mouth., Disp: , Rfl:  .  pregabalin (LYRICA) 100 MG capsule, Take by mouth., Disp: , Rfl:  .  spironolactone (ALDACTONE) 50 MG tablet, Take 50 mg by mouth daily., Disp: , Rfl: 98 .  traMADol (ULTRAM) 50 MG tablet, , Disp: , Rfl:   Current Facility-Administered Medications:  .  betamethasone acetate-betamethasone sodium phosphate (CELESTONE) injection 3 mg, 3 mg, Intramuscular, Once, Evans, Brent M, DPM .  betamethasone acetate-betamethasone sodium phosphate (CELESTONE) injection 3 mg, 3 mg, Intramuscular, Once, Evans, Dorathy Daft, DPM  ROS  Constitutional: Denies any fever or chills Gastrointestinal: No reported hemesis, hematochezia, vomiting, or acute GI distress Musculoskeletal: Denies any acute onset joint swelling, redness, loss of ROM, or weakness Neurological: No reported episodes of acute onset apraxia, aphasia,  dysarthria, agnosia, amnesia, paralysis, loss of coordination, or loss of consciousness  Allergies  Ms. Ozer has No Known Allergies.  [place]  Drug: Ms. Bickle  reports that she does not use drugs. Alcohol:  reports that she drinks alcohol. Tobacco:  reports that  has never smoked. she has never used smokeless tobacco. Medical:  has a past medical history of Breast cancer, right ([place]) (2011), Hypertension, Hypothyroidism, and Sleep apnea. Surgical: Ms. Henault  has a past surgical history that includes Breast  surgery; Cholecystectomy; Appendectomy; Abdominal hysterectomy; and Left Heart Cath (N/A, 11/25/2014). Family: family history includes Cancer in her mother and sister; Depression in her father; Emphysema in her father; Heart disease in her father; Hypertension in her father, mother, and sister.  Constitutional Exam  General appearance: Well nourished, well developed, and well hydrated. In no apparent acute distress Vitals:   04/24/17 1027  BP: 133/88  Pulse: 74  Resp: 18  Temp: 98 F (36.7 C)  SpO2: 100%  Weight: 210 lb (95.3 kg)  Height: '5\' 7"'$  (1.702 m)   BMI Assessment: Estimated body mass index is 32.89 kg/m as calculated from the following:   Height as of this encounter: '5\' 7"'$  (1.702 m).   Weight as of this encounter: 210 lb (95.3 kg).  BMI interpretation table: BMI level Category Range association with higher incidence of chronic pain  <18 kg/m2 Underweight   18.5-24.9 kg/m2 Ideal body weight   25-29.9 kg/m2 Overweight Increased incidence by 20%  30-34.9 kg/m2 Obese (Class I) Increased incidence by 68%  35-39.9 kg/m2 Severe obesity (Class II) Increased incidence by 136%  >40 kg/m2 Extreme obesity (Class III) Increased incidence by 254%   BMI Readings from Last 4 Encounters:  04/24/17 32.89 kg/m  03/26/17 30.41 kg/m  02/05/15 36.18 kg/m  11/25/14 34.06 kg/m   Wt Readings from Last 4 Encounters:  04/24/17 210 lb (95.3 kg)  03/26/17 200 lb (90.7 kg)   02/05/15 231 lb (104.8 kg)  11/25/14 224 lb (101.6 kg)  Psych/Mental status: Alert, oriented x 3 (person, place, & time)       Eyes: PERLA Respiratory: No evidence of acute respiratory distress  Cervical Spine Area Exam  Skin & Axial Inspection: No masses, redness, edema, swelling, or associated skin lesions Alignment: Symmetrical Functional ROM: Unrestricted ROM      Stability: No instability detected Muscle Tone/Strength: Functionally intact. No obvious neuro-muscular anomalies detected. Sensory (Neurological): Unimpaired Palpation: No palpable anomalies              Upper Extremity (UE) Exam    Side: Right upper extremity  Side: Left upper extremity  Skin & Extremity Inspection: Skin color, temperature, and hair growth are WNL. No peripheral edema or cyanosis. No masses, redness, swelling, asymmetry, or associated skin lesions. No contractures.  Skin & Extremity Inspection: Skin color, temperature, and hair growth are WNL. No peripheral edema or cyanosis. No masses, redness, swelling, asymmetry, or associated skin lesions. No contractures.  Functional ROM: Unrestricted ROM          Functional ROM: Unrestricted ROM          Muscle Tone/Strength: Functionally intact. No obvious neuro-muscular anomalies detected.  Muscle Tone/Strength: Functionally intact. No obvious neuro-muscular anomalies detected.  Sensory (Neurological): Unimpaired          Sensory (Neurological): Unimpaired          Palpation: No palpable anomalies              Palpation: No palpable anomalies              Specialized Test(s): Deferred         Specialized Test(s): Deferred          Thoracic Spine Area Exam  Skin & Axial Inspection: No masses, redness, or swelling Alignment: Symmetrical Functional ROM: Unrestricted ROM Stability: No instability detected Muscle Tone/Strength: Functionally intact. No obvious neuro-muscular anomalies detected. Sensory (Neurological): Unimpaired Muscle strength & Tone: No palpable  anomalies  Lumbar Spine Area Exam  Skin & Axial  Inspection: No masses, redness, or swelling Alignment: Symmetrical Functional ROM: Unrestricted ROM      Stability: No instability detected Muscle Tone/Strength: Functionally intact. No obvious neuro-muscular anomalies detected. Sensory (Neurological): Unimpaired Palpation: No palpable anomalies       Provocative Tests: Lumbar Hyperextension and rotation test: evaluation deferred today       Lumbar Lateral bending test: evaluation deferred today       Patrick's Maneuver: evaluation deferred today                    Gait & Posture Assessment  Ambulation: Unassisted Gait: Relatively normal for age and body habitus Posture: WNL   Lower Extremity Exam    Side: Right lower extremity  Side: Left lower extremity  Skin & Extremity Inspection: Skin color, temperature, and hair growth are WNL. No peripheral edema or cyanosis. No masses, redness, swelling, asymmetry, or associated skin lesions. No contractures.  Skin & Extremity Inspection: Skin color, temperature, and hair growth are WNL. No peripheral edema or cyanosis. No masses, redness, swelling, asymmetry, or associated skin lesions. No contractures.  Functional ROM: Unrestricted ROM          Functional ROM: Unrestricted ROM          Muscle Tone/Strength: Functionally intact. No obvious neuro-muscular anomalies detected.  Muscle Tone/Strength: Functionally intact. No obvious neuro-muscular anomalies detected.  Sensory (Neurological): Unimpaired  Sensory (Neurological): Unimpaired  Palpation: No palpable anomalies  Palpation: No palpable anomalies   Assessment  Primary Diagnosis & Pertinent Problem List: The primary encounter diagnosis was Neuropathy. Diagnoses of Leg pain, bilateral, Achilles tendinosis, RLS (restless legs syndrome), and Chronic deep vein thrombosis (DVT) of proximal vein of lower extremity, unspecified laterality ([place]) were also pertinent to this visit.  Status Diagnosis   Stable Stable Stable 1. Neuropathy   2. Leg pain, bilateral   3. Achilles tendinosis   4. RLS (restless legs syndrome)   5. Chronic deep vein thrombosis (DVT) of proximal vein of lower extremity, unspecified laterality ([place])      General Recommendations: The pain condition that the patient suffers from is best treated with a multidisciplinary approach that involves an increase in physical activity to prevent de-conditioning and worsening of the pain cycle, as well as psychological counseling (formal and/or informal) to address the co-morbid psychological affects of pain. Treatment will often involve judicious use of pain medications and interventional procedures to decrease the pain, allowing the patient to participate in the physical activity that will ultimately produce long-lasting pain reductions. The goal of the multidisciplinary approach is to return the patient to a higher level of overall function and to restore their ability to perform activities of daily living.  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 Patient has a history of DVT and is currently anticoagulated on apixaban. Not a candidate for any interventional procedures. At this point we will try and manage her pain with non-opioid analgesics, physical therapy, psychotherapy given her symptoms of depression. I discussed with her that opioid analgesics may not be a good therapeutic option for her given their ineffectiveness in the past and her history of narcolepsy. Patient also has obstructive sleep apnea, another reason why opioid therapy is not a good choice for her for chronic pain.   Patient tolerating gabapentin without any side effects.  We will have her increase her dose to 400 mg, 800 mg, 800 mg for 2 weeks  and then 800 mg 3 times daily thereafter.  Patient  participating in physical therapy.  Plan: -Increase gabapentin 400, 800, 800 for 2 weeks then 800 mg 3 times daily thereafter if no side effects.  Prescription provided. -Continue tizanidine as prescribed for muscle spasms. -Continue physical therapy -Follow-up in 1 month.  If patient stable on gabapentin dose at this visit, will provide 800 mg tablets and space visit out to every 3 months.  Future: -Consider tricyclic antidepressant or Cymbalta if up titration of gabapentin is not effective.  Plan of Care  Pharmacotherapy (Medications Ordered): Meds ordered this encounter  Medications  . gabapentin (NEURONTIN) 400 MG capsule    Sig: 400/800/800 for 2 weeks and then 800 mg three times a day    Dispense:  180 capsule    Refill:  3    Pharmacological management options:  Opioid Analgesics: we'll avoid opioid therapy  Membrane stabilizer: lower extremity swelling with Lyrica. Currently on gabapentin. Can consider Topamax.  Muscle relaxant: trial of tizanidine. Can consider baclofen, Robaxin.  NSAID: To be determined at a later time  Other analgesic(s):-Consider tricyclic antidepressant or Cymbalta if up titration of gabapentin is not effective.   Interventional management options: Ms. Eimer was informed that there is no guarantee that she would be a candidate for interventional therapies. The decision will be based on the results of diagnostic studies, as well as Ms. Mcmichen's risk profile.  Procedure(s) under consideration:  -anticoagulated with apixaban, procedures not an option at the moment     Provider-requested follow-up: Return in about 4 weeks (around 05/22/2017) for Medication Management.  Future Appointments  Date Time Provider [place]  05/22/2017  9:45 AM Gillis Santa, MD [facility] None    Primary Care Physician: Ricardo Jericho, NP Location: [facility] Outpatient Pain Management Facility Note by: Gillis Santa, M.D Date: 04/24/2017; Time: 1:20  PM  Patient Instructions  1. Increase Gabapentin 400/800/800 for 2 weeks and then 800 mg three time a day

## 2017-04-26 ENCOUNTER — Other Ambulatory Visit: Payer: Self-pay | Admitting: Family Medicine

## 2017-04-26 DIAGNOSIS — Z780 Asymptomatic menopausal state: Secondary | ICD-10-CM

## 2017-05-15 ENCOUNTER — Encounter: Payer: Self-pay | Admitting: Podiatry

## 2017-05-15 ENCOUNTER — Ambulatory Visit: Payer: Medicare HMO | Admitting: Podiatry

## 2017-05-15 DIAGNOSIS — M7662 Achilles tendinitis, left leg: Secondary | ICD-10-CM

## 2017-05-15 DIAGNOSIS — M7752 Other enthesopathy of left foot: Secondary | ICD-10-CM | POA: Diagnosis not present

## 2017-05-15 DIAGNOSIS — M6788 Other specified disorders of synovium and tendon, other site: Secondary | ICD-10-CM

## 2017-05-17 NOTE — Progress Notes (Signed)
Patient ID: Karen [name], female   DOB: [birth year], 66 y.o.   MRN: [id number]   HPI:  66 year old female presents the office today for follow-up evaluation regarding left achilles tendinitis. She reports significant pain to the area and wants to discuss surgery after December because she is on Eliquis. The injection helped to provide some relief of the pain. Patient is here for further evaluation and treatment.    Past Medical History:  Diagnosis Date  . Breast cancer, right ([place]) 2011   Right Lumpectomy and Rad tx's.  [place] Hypertension   . Hypothyroidism   . Sleep apnea       Physical Exam: General: The patient is alert and oriented x3 in no acute distress.  Dermatology: Skin is warm, dry and supple bilateral lower extremities. Negative for open lesions or macerations.  Vascular: Palpable pedal pulses bilaterally. No edema or erythema noted. Capillary refill within normal limits.  Neurological: Epicritic and protective threshold grossly intact bilaterally.   Musculoskeletal Exam: Large palpable mass noted to the posterior heel of the left lower extremity. This appears soft tissue in nature. There is diffuse edema noted throughout the posterior ankle joint. Muscle strength 5/5 in all muscle groups left lower extremity including plantar flexion. Achilles tendon appears to be intact. Hammertoe contracture deformity also noted digits 2-3 right foot with clinical evidence of bunion deformity right foot   Assessment: 1. Severe Achilles tendinitis left   Plan of Care:  1. Patient was evaluated.  2. Injection of 0.5 mLs of [place] Soluspan injected into the retrocalcaneal bursa. Care was taken to avoid direct injection into the tendon. 3. Discussed the need for surgical intervention at this point. Patient diagnosed with DVTs of the lower extremities in September 2018. Will require medical clearance from vascular.  4. Cannot tolerate oral NSAIDs due to taking Eliquis.  5. Return to  clinic in 4 weeks just before patient leaves for [facility] for Christmas.      Edrick Kins, DPM [facility]  Dr. Edrick Kins, DPM    [address], [place] [address]                Office [phone]  Fax [phone]

## 2017-05-22 ENCOUNTER — Ambulatory Visit
Payer: Medicare HMO | Attending: Student in an Organized Health Care Education/Training Program | Admitting: Student in an Organized Health Care Education/Training Program

## 2017-05-22 ENCOUNTER — Encounter: Payer: Self-pay | Admitting: Student in an Organized Health Care Education/Training Program

## 2017-05-22 VITALS — BP 134/82 | HR 73 | Temp 98.0°F | Resp 16 | Ht 67.0 in | Wt 210.0 lb

## 2017-05-22 DIAGNOSIS — Z79899 Other long term (current) drug therapy: Secondary | ICD-10-CM | POA: Insufficient documentation

## 2017-05-22 DIAGNOSIS — M79604 Pain in right leg: Secondary | ICD-10-CM | POA: Diagnosis not present

## 2017-05-22 DIAGNOSIS — Z90710 Acquired absence of both cervix and uterus: Secondary | ICD-10-CM | POA: Insufficient documentation

## 2017-05-22 DIAGNOSIS — Z6835 Body mass index (BMI) 35.0-35.9, adult: Secondary | ICD-10-CM | POA: Insufficient documentation

## 2017-05-22 DIAGNOSIS — G2581 Restless legs syndrome: Secondary | ICD-10-CM | POA: Diagnosis not present

## 2017-05-22 DIAGNOSIS — M7989 Other specified soft tissue disorders: Secondary | ICD-10-CM | POA: Diagnosis not present

## 2017-05-22 DIAGNOSIS — G8929 Other chronic pain: Secondary | ICD-10-CM | POA: Diagnosis not present

## 2017-05-22 DIAGNOSIS — M25572 Pain in left ankle and joints of left foot: Secondary | ICD-10-CM | POA: Diagnosis present

## 2017-05-22 DIAGNOSIS — K5730 Diverticulosis of large intestine without perforation or abscess without bleeding: Secondary | ICD-10-CM | POA: Diagnosis not present

## 2017-05-22 DIAGNOSIS — G629 Polyneuropathy, unspecified: Secondary | ICD-10-CM | POA: Diagnosis not present

## 2017-05-22 DIAGNOSIS — M7662 Achilles tendinitis, left leg: Secondary | ICD-10-CM | POA: Diagnosis not present

## 2017-05-22 DIAGNOSIS — M7660 Achilles tendinitis, unspecified leg: Secondary | ICD-10-CM

## 2017-05-22 DIAGNOSIS — M25571 Pain in right ankle and joints of right foot: Secondary | ICD-10-CM | POA: Diagnosis present

## 2017-05-22 DIAGNOSIS — M79605 Pain in left leg: Secondary | ICD-10-CM | POA: Insufficient documentation

## 2017-05-22 DIAGNOSIS — Z86718 Personal history of other venous thrombosis and embolism: Secondary | ICD-10-CM | POA: Diagnosis not present

## 2017-05-22 DIAGNOSIS — Z853 Personal history of malignant neoplasm of breast: Secondary | ICD-10-CM | POA: Diagnosis not present

## 2017-05-22 DIAGNOSIS — I825Y9 Chronic embolism and thrombosis of unspecified deep veins of unspecified proximal lower extremity: Secondary | ICD-10-CM | POA: Insufficient documentation

## 2017-05-22 DIAGNOSIS — M1990 Unspecified osteoarthritis, unspecified site: Secondary | ICD-10-CM | POA: Insufficient documentation

## 2017-05-22 DIAGNOSIS — M6788 Other specified disorders of synovium and tendon, other site: Secondary | ICD-10-CM

## 2017-05-22 MED ORDER — TIZANIDINE HCL 2 MG PO TABS: 2.0000 mg | ORAL_TABLET | Freq: Two times a day (BID) | ORAL | 4 refills | Status: DC | PRN

## 2017-05-22 MED ORDER — DULOXETINE HCL 30 MG PO CPEP: 30.0000 mg | ORAL_CAPSULE | Freq: Every day | ORAL | 3 refills | Status: DC

## 2017-05-22 NOTE — Patient Instructions (Signed)
1. Continue all medications as prescribed 2. Start Cymbalta 30 mg daily after breakfast. Ok to increase 60 mg daily after 1 month

## 2017-05-22 NOTE — Progress Notes (Signed)
Safety precautions to be maintained throughout the outpatient stay will include: orient to surroundings, keep bed in low position, maintain call bell within reach at all times, provide assistance with transfer out of bed and ambulation.  

## 2017-05-22 NOTE — Progress Notes (Signed)
Patient's Name: Karen [name]  MRN: [id number]  Referring Provider: Ricardo Jericho*  DOB: May 18, 1951  PCP: Ricardo Jericho, NP  DOS: 05/22/2017  Note by: Gillis Santa, MD  Service setting: Ambulatory outpatient  Specialty: Interventional Pain Management  Location: [facility] (AMB) Pain Management Facility    Patient type: Established   Primary Reason(s) for Visit: Encounter for prescription drug management. (Level of risk: moderate)  CC: Foot Pain (bilateral neuropathy); Leg Pain (bilateral); and Joint Pain (generalized aches all over)  HPI  Karen [name] is a 66 y.o. year old, female patient, who comes today for a medication management evaluation. She has Adult BMI 35.0-35.9 kg/sq m; Arthritis; Neuropathy; Leg pain, bilateral; Achilles tendinosis; RLS (restless legs syndrome); and Chronic deep vein thrombosis (DVT) of proximal vein of lower extremity (HCC) on their problem list. Her primarily concern today is the Foot Pain (bilateral neuropathy); Leg Pain (bilateral); and Joint Pain (generalized aches all over)  Pain Assessment: Location: Left, Right Foot(see visit info for other pain sites) Radiating: NA Onset: More than a month ago Duration: Chronic pain Quality: (S) Stabbing, Aching(joint pain is more aching and throbbing with stiffness and feet are stabbing.  ) Severity: 2 /10 (self-reported pain score)  Note: Reported level is compatible with observation.                         When using our objective Pain Scale, levels between 6 and 10/10 are said to belong in an emergency room, as it progressively worsens from a 6/10, described as severely limiting, requiring emergency care not usually available at an outpatient pain management facility. At a 6/10 level, communication becomes difficult and requires great effort. Assistance to reach the emergency department may be required. Facial flushing and profuse sweating along with potentially dangerous increases in heart rate and blood  pressure will be evident. Effect on ADL: joint pain increases with activity Timing: Constant Modifying factors: rest, tylenol  [name] was last scheduled for an appointment on 04/24/2017 for medication management. During today's appointment we reviewed [name]'s chronic pain status, as well as her outpatient medication regimen.  Overall  patient's pain at baseline.  She does endorse depressive symptoms of low energy, difficulty sleeping, lack of pleasure in hobbies.  Patient states that she has noticed some lower extremity swelling.  She has been applying compression stockings.  She is hesitant to increase her gabapentin dose as a result of this.  I think this is reasonable.  Patient states that the Zanaflex helps her sleep at night.  She takes 1-2 tablets in the evening.  The patient  reports that she does not use drugs. Her body mass index is 32.89 kg/m.  Further details on both, my assessment(s), as well as the proposed treatment plan, please see below.   Laboratory Chemistry  Inflammation Markers (CRP: Acute Phase) (ESR: Chronic Phase) No results found for: CRP, ESRSEDRATE, LATICACIDVEN               Rheumatology Markers No results found for: RF, ANA, LABURIC, URICUR, LYMEIGGIGMAB, [facility]              Renal Function Markers Lab Results  Component Value Date   BUN 10 02/05/2015   CREATININE 0.61 02/05/2015   GFRAA >60 02/05/2015   GFRNONAA >60 02/05/2015                 Hepatic Function Markers No results found for: AST, ALT, ALBUMIN, ALKPHOS, HCVAB,  AMYLASE, LIPASE, AMMONIA               Electrolytes Lab Results  Component Value Date   NA 138 02/05/2015   K 3.8 02/05/2015   CL 101 02/05/2015   CALCIUM 9.4 02/05/2015                 Neuropathy Markers No results found for: VITAMINB12, FOLATE, HGBA1C, HIV               Bone Pathology Markers No results found for: VD25OH, VD125OH2TOT, [id number], [id number], 25OHVITD1, 25OHVITD2, 25OHVITD3, TESTOFREE,  TESTOSTERONE               Coagulation Parameters Lab Results  Component Value Date   PLT 184 02/05/2015                 Cardiovascular Markers Lab Results  Component Value Date   HGB 13.1 02/05/2015   HCT 40.2 02/05/2015                 CA Markers No results found for: CEA, CA125, LABCA2               Note: Lab results reviewed.  Recent Diagnostic Imaging Results  CT ABDOMEN PELVIS W CONTRAST Addendum: ADDENDUM REPORT: 04/03/2017 16:00   ADDENDUM:  8 x 2 x 4 cm lens shaped lesion lateral to the left hip musculature  is compatible with chronic coalescence of the large hematoma seen at  this location on the prior CT study of 11/05/2013.   Electronically Signed    By: Misty Stanley M.D.    On: 04/03/2017 16:00 Narrative: CLINICAL DATA:  Generalized abdominal pain with bloating and diarrhea. History of breast cancer.  EXAM: CT ABDOMEN AND PELVIS WITH CONTRAST  TECHNIQUE: Multidetector CT imaging of the abdomen and pelvis was performed using the standard protocol following bolus administration of intravenous contrast.  CONTRAST:  12m ISOVUE-300 IOPAMIDOL (ISOVUE-300) INJECTION 61%  COMPARISON:  None.  FINDINGS: Lower chest:  Unremarkable  Hepatobiliary: No focal abnormality within the liver parenchyma. There is no evidence for gallstones, gallbladder wall thickening, or pericholecystic fluid. No intrahepatic or extrahepatic biliary dilation.  Pancreas: No focal mass lesion. No dilatation of the main duct. No intraparenchymal cyst. No peripancreatic edema.  Spleen: No splenomegaly. No focal mass lesion.  Adrenals/Urinary Tract: No adrenal nodule or mass. Right kidney unremarkable. 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 No evidence for hydroureter. Bladder is decompressed.  Stomach/Bowel: Stomach is nondistended. No gastric wall thickening. No evidence of outlet obstruction. Duodenum is normally  positioned as is the ligament of Treitz. Duodenal diverticulum evident. No small bowel wall thickening. No small bowel dilatation. The terminal ileum is normal. Nonvisualization of the appendix is consistent with the reported history of appendectomy. Diverticular changes are noted in the left colon without evidence of diverticulitis.  Vascular/Lymphatic: There is abdominal aortic atherosclerosis without aneurysm. There is no gastrohepatic or hepatoduodenal ligament lymphadenopathy. No intraperitoneal or retroperitoneal lymphadenopathy. No pelvic sidewall lymphadenopathy.  Reproductive: Uterus surgically absent.  There is no adnexal mass.  Other: No intraperitoneal free fluid.  Musculoskeletal: Bone windows reveal no worrisome lytic or sclerotic osseous lesions.  IMPRESSION: 1. No acute findings in the abdomen or pelvis. No features to explain the patient's history of abdominal pain with bloating and diarrhea. 2. Left colonic diverticulosis without diverticulitis. 3.  Aortic Atherosclerois (ICD10-170.0)  Electronically Signed: By: EMisty StanleyM.D. On: 04/03/2017 12:06  Complexity Note: Imaging results  reviewed. Results shared with Ms. Klemz, using Layman's terms.                         Meds   Current Outpatient Medications:  .  apixaban (ELIQUIS) 5 MG TABS tablet, Take 5 mg by mouth 2 (two) times daily. , Disp: , Rfl:  .  gabapentin (NEURONTIN) 400 MG capsule, 400/800/800 for 2 weeks and then 800 mg three times a day, Disp: 180 capsule, Rfl: 3 .  levothyroxine (SYNTHROID, LEVOTHROID) 75 MCG tablet, TAKE 1 TABLET EVERY DAY ON AN EMPTY STOMACH WITH WATER 30-60 MINUTES BEFORE BREAKFAST, Disp: , Rfl:  .  lisinopril (PRINIVIL,ZESTRIL) 20 MG tablet, Take by mouth., Disp: , Rfl:  .  NONFORMULARY OR COMPOUNDED ITEM, See pharmacy note, Disp: 120 each, Rfl: 2 .  pramipexole (MIRAPEX) 1 MG tablet, Take 1 mg by mouth 3 (three) times daily., Disp: , Rfl:  .  Pramipexole  Dihydrochloride 1.5 MG TB24, Take by mouth., Disp: , Rfl:  .  tiZANidine (ZANAFLEX) 2 MG tablet, Take 1 tablet (2 mg total) by mouth 2 (two) times daily as needed for muscle spasms., Disp: 60 tablet, Rfl: 4 .  diclofenac (VOLTAREN) 75 MG EC tablet, Take 1 tablet (75 mg total) by mouth 2 (two) times daily. (Patient not taking: Reported on 04/24/2017), Disp: 60 tablet, Rfl: 0 .  DULoxetine (CYMBALTA) 30 MG capsule, Take 1 capsule (30 mg total) by mouth daily., Disp: 30 capsule, Rfl: 3 .  gentamicin cream (GARAMYCIN) 0.1 %, Apply 1 application topically 3 (three) times daily. (Patient not taking: Reported on 04/24/2017), Disp: 30 g, Rfl: 1 .  HYDROcodone-acetaminophen (NORCO/VICODIN) 5-325 MG tablet, Take 1 tablet by mouth every 6 (six) hours as needed for moderate pain. (Patient not taking: Reported on 03/26/2017), Disp: 30 tablet, Rfl: 0 .  hyoscyamine (LEVBID) 0.375 MG 12 hr tablet, Take by mouth., Disp: , Rfl:  .  Omega-3 Fatty Acids (FISH OIL) 1000 MG CAPS, Take 1,000 mg by mouth daily., Disp: , Rfl:  .  pregabalin (LYRICA) 100 MG capsule, Take by mouth., Disp: , Rfl:  .  spironolactone (ALDACTONE) 50 MG tablet, Take 50 mg by mouth daily., Disp: , Rfl: 98 .  traMADol (ULTRAM) 50 MG tablet, , Disp: , Rfl:   Current Facility-Administered Medications:  .  betamethasone acetate-betamethasone sodium phosphate (CELESTONE) injection 3 mg, 3 mg, Intramuscular, Once, Evans, Brent M, DPM .  betamethasone acetate-betamethasone sodium phosphate (CELESTONE) injection 3 mg, 3 mg, Intramuscular, Once, Evans, Dorathy Daft, DPM  ROS  Constitutional: Denies any fever or chills Gastrointestinal: No reported hemesis, hematochezia, vomiting, or acute GI distress Musculoskeletal: Denies any acute onset joint swelling, redness, loss of ROM, or weakness Neurological: No reported episodes of acute onset apraxia, aphasia, dysarthria, agnosia, amnesia, paralysis, loss of coordination, or loss of consciousness  Allergies  Ms.  Traister has No Known Allergies.  [place]  Drug: Ms. Height  reports that she does not use drugs. Alcohol:  reports that she drinks alcohol. Tobacco:  reports that  has never smoked. she has never used smokeless tobacco. Medical:  has a past medical history of Breast cancer, right ([place]) (2011), Hypertension, Hypothyroidism, and Sleep apnea. Surgical: Ms. Bady  has a past surgical history that includes Cardiac catheterization (N/A, 11/25/2014); Breast surgery; Cholecystectomy; Appendectomy; and Abdominal hysterectomy. Family: family history includes Cancer in her mother and sister; Depression in her father; Emphysema in her father; Heart disease in her father; Hypertension in her father, mother,  and sister.  Constitutional Exam  General appearance: Well nourished, well developed, and well hydrated. In no apparent acute distress Vitals:   05/22/17 0956  BP: 134/82  Pulse: 73  Resp: 16  Temp: 98 F (36.7 C)  TempSrc: Oral  SpO2: 99%  Weight: 210 lb (95.3 kg)  Height: '5\' 7"'$  (1.702 m)   BMI Assessment: Estimated body mass index is 32.89 kg/m as calculated from the following:   Height as of this encounter: '5\' 7"'$  (1.702 m).   Weight as of this encounter: 210 lb (95.3 kg).  BMI interpretation table: BMI level Category Range association with higher incidence of chronic pain  <18 kg/m2 Underweight   18.5-24.9 kg/m2 Ideal body weight   25-29.9 kg/m2 Overweight Increased incidence by 20%  30-34.9 kg/m2 Obese (Class I) Increased incidence by 68%  35-39.9 kg/m2 Severe obesity (Class II) Increased incidence by 136%  >40 kg/m2 Extreme obesity (Class III) Increased incidence by 254%   BMI Readings from Last 4 Encounters:  05/22/17 32.89 kg/m  04/24/17 32.89 kg/m  03/26/17 30.41 kg/m  02/05/15 36.18 kg/m   Wt Readings from Last 4 Encounters:  05/22/17 210 lb (95.3 kg)  04/24/17 210 lb (95.3 kg)  03/26/17 200 lb (90.7 kg)  02/05/15 231 lb (104.8 kg)  Psych/Mental status: Alert,  oriented x 3 (person, place, & time)       Eyes: PERLA Respiratory: No evidence of acute respiratory distress  Cervical Spine Area Exam  Skin & Axial Inspection: No masses, redness, edema, swelling, or associated skin lesions Alignment: Symmetrical Functional ROM: Unrestricted ROM      Stability: No instability detected Muscle Tone/Strength: Functionally intact. No obvious neuro-muscular anomalies detected. Sensory (Neurological): Unimpaired Palpation: No palpable anomalies              Upper Extremity (UE) Exam    Side: Right upper extremity  Side: Left upper extremity  Skin & Extremity Inspection: Skin color, temperature, and hair growth are WNL. No peripheral edema or cyanosis. No masses, redness, swelling, asymmetry, or associated skin lesions. No contractures.  Skin & Extremity Inspection: Skin color, temperature, and hair growth are WNL. No peripheral edema or cyanosis. No masses, redness, swelling, asymmetry, or associated skin lesions. No contractures.  Functional ROM: Unrestricted ROM          Functional ROM: Unrestricted ROM          Muscle Tone/Strength: Functionally intact. No obvious neuro-muscular anomalies detected.  Muscle Tone/Strength: Functionally intact. No obvious neuro-muscular anomalies detected.  Sensory (Neurological): Unimpaired          Sensory (Neurological): Unimpaired          Palpation: No palpable anomalies              Palpation: No palpable anomalies              Specialized Test(s): Deferred         Specialized Test(s): Deferred          Thoracic Spine Area Exam  Skin & Axial Inspection: No masses, redness, or swelling Alignment: Symmetrical Functional ROM: Unrestricted ROM Stability: No instability detected Muscle Tone/Strength: Functionally intact. No obvious neuro-muscular anomalies detected. Sensory (Neurological): Unimpaired Muscle strength & Tone: No palpable anomalies  Lumbar Spine Area Exam  Skin & Axial Inspection: No masses, redness, or  swelling Alignment: Symmetrical Functional ROM: Unrestricted ROM      Stability: No instability detected Muscle Tone/Strength: Functionally intact. No obvious neuro-muscular anomalies detected. Sensory (Neurological): Unimpaired Palpation: No palpable  anomalies       Provocative Tests: Lumbar Hyperextension and rotation test: evaluation deferred today       Lumbar Lateral bending test: evaluation deferred today       Patrick's Maneuver: evaluation deferred today                    Gait & Posture Assessment  Ambulation: Unassisted Gait: Relatively normal for age and body habitus Posture: WNL   Lower Extremity Exam    Side: Right lower extremity  Side: Left lower extremity  Skin & Extremity Inspection: Skin color, temperature, and hair growth are WNL. No peripheral edema or cyanosis. No masses, redness, swelling, asymmetry, or associated skin lesions. No contractures.  Skin & Extremity Inspection: Skin color, temperature, and hair growth are WNL. No peripheral edema or cyanosis. No masses, redness, swelling, asymmetry, or associated skin lesions. No contractures.  Functional ROM: Unrestricted ROM          Functional ROM: Unrestricted ROM          Muscle Tone/Strength: Functionally intact. No obvious neuro-muscular anomalies detected.  Muscle Tone/Strength: Functionally intact. No obvious neuro-muscular anomalies detected.  Sensory (Neurological): Unimpaired  Sensory (Neurological): Unimpaired  Palpation: No palpable anomalies  Palpation: No palpable anomalies   Assessment  Primary Diagnosis & Pertinent Problem List: The primary encounter diagnosis was Neuropathy. Diagnoses of Leg pain, bilateral, Achilles tendinosis, RLS (restless legs syndrome), and Chronic deep vein thrombosis (DVT) of proximal vein of lower extremity, unspecified laterality ([place]) were also pertinent to this visit.  Status Diagnosis  Controlled Controlled Controlled 1. Neuropathy   2. Leg pain, bilateral   3.  Achilles tendinosis   4. RLS (restless legs syndrome)   5. Chronic deep vein thrombosis (DVT) of proximal vein of lower extremity, unspecified laterality ([place])      General Recommendations: The pain condition that the patient suffers from is best treated with a multidisciplinary approach that involves an increase in physical activity to prevent de-conditioning and worsening of the pain cycle, as well as psychological counseling (formal and/or informal) to address the co-morbid psychological affects of pain. Treatment will often involve judicious use of pain medications and interventional procedures to decrease the pain, allowing the patient to participate in the physical activity that will ultimately produce long-lasting pain reductions. The goal of the multidisciplinary approach is to return the patient to a higher level of overall function and to restore their ability to perform activities of daily living.  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 Patient has a history of DVT and is currently anticoagulated on apixaban. Not a candidate for any interventional procedures. At this point we will try and manage her pain with non-opioid analgesics, physical therapy, psychotherapy given her symptoms of depression. I discussed with her that opioid analgesics may not be a good therapeutic option for her given their ineffectiveness in the past and her history of narcolepsy. Patient also has obstructive sleep apnea, another reason why opioid therapy is not a good choice for her for chronic pain.   Given her depressive symptoms and neuropathic pain, and limited ability to titrate up gabapentin secondary to side effects of intermittent lower extremity swelling, I will add Cymbalta 30 mg daily for 4 weeks and have the patient titrate up to 60 mg thereafter if  she is not having any side effects.  This may help with her neuropathic pain symptoms  as well as her symptoms of depression.  Plan: -Continue gabapentin 400 mg 3 times daily -Continue Zanaflex 2 mg twice daily as needed muscle spasms -Add Cymbalta 30 mg daily for 4 weeks and then increase to 60 mg daily if no side effects -Follow-up in 6 weeks.   Plan of Care  Pharmacotherapy (Medications Ordered): Meds ordered this encounter  Medications  . tiZANidine (ZANAFLEX) 2 MG tablet    Sig: Take 1 tablet (2 mg total) by mouth 2 (two) times daily as needed for muscle spasms.    Dispense:  60 tablet    Refill:  4  . DULoxetine (CYMBALTA) 30 MG capsule    Sig: Take 1 capsule (30 mg total) by mouth daily.    Dispense:  30 capsule    Refill:  3    Provider-requested follow-up: Return in about 6 weeks (around 07/03/2017) for Medication Management.  Future Appointments  Date Time Provider [place]  06/05/2017  8:30 AM Edrick Kins, DPM [id number]  07/03/2017  9:45 AM Gillis Santa, MD [facility] None    Primary Care Physician: Ricardo Jericho, NP Location: [facility] Note by: Gillis Santa, M.D Date: 05/22/2017; Time: 10:59 AM  Patient Instructions  1. Continue all medications as prescribed 2. Start Cymbalta 30 mg daily after breakfast. Ok to increase 60 mg daily after 1 month

## 2017-06-05 ENCOUNTER — Ambulatory Visit: Payer: Medicare HMO | Admitting: Podiatry

## 2017-06-05 ENCOUNTER — Encounter: Payer: Self-pay | Admitting: Podiatry

## 2017-06-05 DIAGNOSIS — M7752 Other enthesopathy of left foot: Secondary | ICD-10-CM

## 2017-06-07 NOTE — Progress Notes (Signed)
Patient ID: Karen [name], female   DOB: [birth year], 66 y.o.   MRN: [id number]   HPI:  66 year old female presents the office today for follow-up evaluation regarding left achilles tendinitis. She states she is doing better and the injection helped alleviate the pain for approximately 3 weeks. She states the pain is starting to return in the past week. Patient is here for further evaluation and treatment.    Past Medical History:  Diagnosis Date  . Breast cancer, right ([place]) 2011   Right Lumpectomy and Rad tx's.  [place] Kitchen Hypertension   . Hypothyroidism   . Sleep apnea       Physical Exam: General: The patient is alert and oriented x3 in no acute distress.  Dermatology: Skin is warm, dry and supple bilateral lower extremities. Negative for open lesions or macerations.  Vascular: Palpable pedal pulses bilaterally. No edema or erythema noted. Capillary refill within normal limits.  Neurological: Epicritic and protective threshold grossly intact bilaterally.   Musculoskeletal Exam: Large palpable mass noted to the posterior heel of the left lower extremity. This appears soft tissue in nature. There is diffuse edema noted throughout the posterior ankle joint. Muscle strength 5/5 in all muscle groups left lower extremity including plantar flexion. Achilles tendon appears to be intact. Hammertoe contracture deformity also noted digits 2-3 right foot with clinical evidence of bunion deformity right foot   Assessment: 1. Severe Achilles tendinitis left - improved   Plan of Care:  1. Patient was evaluated.  2. Injection of 0.5 mLs of [place] Soluspan injected into the retrocalcaneal bursa. Care was taken to avoid direct injection into the tendon. 3. Discussed the need for surgical intervention at this point. Patient diagnosed with DVTs of the lower extremities in September 2018. Will require medical clearance from vascular.  4. Cannot tolerate oral NSAIDs due to taking Eliquis.  5.  Patient meets with vascular mid-January for follow up for DVT. 6. Return to clinic in 6 weeks.   Patient going to [facility] for Christmas to see 72 month old granddaughter.    Edrick Kins, DPM [facility]  Dr. Edrick Kins, DPM    [address], [place] [address]                Office [phone]  Fax [phone]

## 2017-07-03 ENCOUNTER — Other Ambulatory Visit: Payer: Self-pay

## 2017-07-03 ENCOUNTER — Ambulatory Visit
Payer: Medicare HMO | Attending: Student in an Organized Health Care Education/Training Program | Admitting: Student in an Organized Health Care Education/Training Program

## 2017-07-03 VITALS — BP 141/87 | HR 94 | Temp 97.7°F | Resp 16 | Ht 67.0 in | Wt 210.0 lb

## 2017-07-03 DIAGNOSIS — G629 Polyneuropathy, unspecified: Secondary | ICD-10-CM | POA: Diagnosis not present

## 2017-07-03 DIAGNOSIS — I825Y9 Chronic embolism and thrombosis of unspecified deep veins of unspecified proximal lower extremity: Secondary | ICD-10-CM

## 2017-07-03 DIAGNOSIS — Z90710 Acquired absence of both cervix and uterus: Secondary | ICD-10-CM | POA: Insufficient documentation

## 2017-07-03 DIAGNOSIS — M79605 Pain in left leg: Secondary | ICD-10-CM

## 2017-07-03 DIAGNOSIS — M25551 Pain in right hip: Secondary | ICD-10-CM | POA: Insufficient documentation

## 2017-07-03 DIAGNOSIS — Z79891 Long term (current) use of opiate analgesic: Secondary | ICD-10-CM | POA: Insufficient documentation

## 2017-07-03 DIAGNOSIS — M7660 Achilles tendinitis, unspecified leg: Secondary | ICD-10-CM | POA: Diagnosis not present

## 2017-07-03 DIAGNOSIS — Z9049 Acquired absence of other specified parts of digestive tract: Secondary | ICD-10-CM | POA: Diagnosis not present

## 2017-07-03 DIAGNOSIS — M25552 Pain in left hip: Secondary | ICD-10-CM | POA: Diagnosis not present

## 2017-07-03 DIAGNOSIS — Z853 Personal history of malignant neoplasm of breast: Secondary | ICD-10-CM | POA: Insufficient documentation

## 2017-07-03 DIAGNOSIS — Z79899 Other long term (current) drug therapy: Secondary | ICD-10-CM | POA: Diagnosis not present

## 2017-07-03 DIAGNOSIS — M7662 Achilles tendinitis, left leg: Secondary | ICD-10-CM | POA: Insufficient documentation

## 2017-07-03 DIAGNOSIS — Z5181 Encounter for therapeutic drug level monitoring: Secondary | ICD-10-CM | POA: Diagnosis not present

## 2017-07-03 DIAGNOSIS — G2581 Restless legs syndrome: Secondary | ICD-10-CM | POA: Insufficient documentation

## 2017-07-03 DIAGNOSIS — E039 Hypothyroidism, unspecified: Secondary | ICD-10-CM | POA: Insufficient documentation

## 2017-07-03 DIAGNOSIS — I10 Essential (primary) hypertension: Secondary | ICD-10-CM | POA: Diagnosis not present

## 2017-07-03 DIAGNOSIS — M79604 Pain in right leg: Secondary | ICD-10-CM

## 2017-07-03 DIAGNOSIS — M6788 Other specified disorders of synovium and tendon, other site: Secondary | ICD-10-CM

## 2017-07-03 NOTE — Progress Notes (Signed)
Nursing Pain Medication Assessment:  Safety precautions to be maintained throughout the outpatient stay will include: orient to surroundings, keep bed in low position, maintain call bell within reach at all times, provide assistance with transfer out of bed and ambulation.  Medication Inspection Compliance: Ms. Hoot did not comply with our request to bring her pills to be counted. She was reminded that bringing the medication bottles, even when empty, is a requirement.  Medication: None brought in. Pill/Patch Count: None available to be counted. Bottle Appearance: No container available. Did not bring bottle(s) to appointment. Filled Date: N/A Last Medication intake:  not on opioid therapy

## 2017-07-03 NOTE — Progress Notes (Signed)
Patient's Name: Karen [name]  MRN: [id number]  Referring Provider: Ricardo Jericho*  DOB: 06-09-1951  PCP: Ricardo Jericho, NP  DOS: 07/03/2017  Note by: Gillis Santa, MD  Service setting: Ambulatory outpatient  Specialty: Interventional Pain Management  Location: [facility] (AMB) Pain Management Facility    Patient type: Established   Primary Reason(s) for Visit: Encounter for prescription drug management. (Level of risk: moderate)  CC: Joint Pain (hips bilateral )  HPI  [name] is a 67 y.o. year old, female patient, who comes today for a medication management evaluation. She has Adult BMI 35.0-35.9 kg/sq m; Arthritis; Neuropathy; Leg pain, bilateral; Achilles tendinosis; RLS (restless legs syndrome); and Chronic deep vein thrombosis (DVT) of proximal vein of lower extremity (HCC) on their problem list. Her primarily concern today is the Joint Pain (hips bilateral )  Pain Assessment: Location: Right, Left Other (Comment)(joint) Radiating: hip joints, side of legs to thighs, right is worse Onset: More than a month ago Duration:   Quality: Aching Severity: 1 /10 (self-reported pain score)  Note: Reported level is compatible with observation.                         When using our objective Pain Scale, levels between 6 and 10/10 are said to belong in an emergency room, as it progressively worsens from a 6/10, described as severely limiting, requiring emergency care not usually available at an outpatient pain management facility. At a 6/10 level, communication becomes difficult and requires great effort. Assistance to reach the emergency department may be required. Facial flushing and profuse sweating along with potentially dangerous increases in heart rate and blood pressure will be evident. Effect on ADL: move slowly, hold on while climbing steps Timing: Constant Modifying factors: exercise, tylenol, rest,   [name] was last scheduled for an appointment on 05/22/2017 for  medication management. During today's appointment we reviewed Ms. Pair's chronic pain status, as well as her outpatient medication regimen.  Patient returns for follow-up.  At last visit, Cymbalta was started at 30 mg and patient was instructed to increase to 60 mg daily.  Patient finds Cymbalta beneficial.  She states that her neuropathic pain symptoms are better.  She has been exercising approximately 3 times a week at her gym which I commended her on.  She has an appointment with a psychiatrist this upcoming Thursday.  The patient  reports that she does not use drugs. Her body mass index is 32.89 kg/m.  Further details on both, my assessment(s), as well as the proposed treatment plan, please see below.   Laboratory Chemistry  Inflammation Markers (CRP: Acute Phase) (ESR: Chronic Phase) No results found for: CRP, ESRSEDRATE, LATICACIDVEN               Rheumatology Markers No results found for: RF, ANA, LABURIC, URICUR, LYMEIGGIGMAB, [facility]              Renal Function Markers Lab Results  Component Value Date   BUN 10 02/05/2015   CREATININE 0.61 02/05/2015   GFRAA >60 02/05/2015   GFRNONAA >60 02/05/2015                 Hepatic Function Markers No results found for: AST, ALT, ALBUMIN, ALKPHOS, HCVAB, AMYLASE, LIPASE, AMMONIA               Electrolytes Lab Results  Component Value Date   NA 138 02/05/2015   K 3.8 02/05/2015   CL  101 02/05/2015   CALCIUM 9.4 02/05/2015                 Neuropathy Markers No results found for: VITAMINB12, FOLATE, HGBA1C, HIV               Bone Pathology Markers No results found for: VD25OH, [id number], [id number], [id number], 25OHVITD1, 25OHVITD2, 25OHVITD3, TESTOFREE, TESTOSTERONE               Coagulation Parameters Lab Results  Component Value Date   PLT 184 02/05/2015                 Cardiovascular Markers Lab Results  Component Value Date   HGB 13.1 02/05/2015   HCT 40.2 02/05/2015                 CA Markers No  results found for: CEA, CA125, LABCA2               Note: Lab results reviewed.  Recent Diagnostic Imaging Results  CT ABDOMEN PELVIS W CONTRAST Addendum: ADDENDUM REPORT: 04/03/2017 16:00   ADDENDUM:  8 x 2 x 4 cm lens shaped lesion lateral to the left hip musculature  is compatible with chronic coalescence of the large hematoma seen at  this location on the prior CT study of 11/05/2013.   Electronically Signed    By: Misty Stanley M.D.    On: 04/03/2017 16:00 Narrative: CLINICAL DATA:  Generalized abdominal pain with bloating and diarrhea. History of breast cancer.  EXAM: CT ABDOMEN AND PELVIS WITH CONTRAST  TECHNIQUE: Multidetector CT imaging of the abdomen and pelvis was performed using the standard protocol following bolus administration of intravenous contrast.  CONTRAST:  1109m ISOVUE-300 IOPAMIDOL (ISOVUE-300) INJECTION 61%  COMPARISON:  None.  FINDINGS: Lower chest:  Unremarkable  Hepatobiliary: No focal abnormality within the liver parenchyma. There is no evidence for gallstones, gallbladder wall thickening, or pericholecystic fluid. No intrahepatic or extrahepatic biliary dilation.  Pancreas: No focal mass lesion. No dilatation of the main duct. No intraparenchymal cyst. No peripancreatic edema.  Spleen: No splenomegaly. No focal mass lesion.  Adrenals/Urinary Tract: No adrenal nodule or mass. Right kidney unremarkable. 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 No evidence for hydroureter. Bladder is decompressed.  Stomach/Bowel: Stomach is nondistended. No gastric wall thickening. No evidence of outlet obstruction. Duodenum is normally positioned as is the ligament of Treitz. Duodenal diverticulum evident. No small bowel wall thickening. No small bowel dilatation. The terminal ileum is normal. Nonvisualization of the appendix is consistent with the reported history of appendectomy. Diverticular changes are  noted in the left colon without evidence of diverticulitis.  Vascular/Lymphatic: There is abdominal aortic atherosclerosis without aneurysm. There is no gastrohepatic or hepatoduodenal ligament lymphadenopathy. No intraperitoneal or retroperitoneal lymphadenopathy. No pelvic sidewall lymphadenopathy.  Reproductive: Uterus surgically absent.  There is no adnexal mass.  Other: No intraperitoneal free fluid.  Musculoskeletal: Bone windows reveal no worrisome lytic or sclerotic osseous lesions.  IMPRESSION: 1. No acute findings in the abdomen or pelvis. No features to explain the patient's history of abdominal pain with bloating and diarrhea. 2. Left colonic diverticulosis without diverticulitis. 3.  Aortic Atherosclerois (ICD10-170.0)  Electronically Signed: By: EMisty StanleyM.D. On: 04/03/2017 12:06  Complexity Note: Imaging results reviewed. Results shared with Ms. Deberry, using Layman's terms.                         Meds   Current  Outpatient Medications:  .  DULoxetine (CYMBALTA) 30 MG capsule, Take 1 capsule (30 mg total) by mouth daily., Disp: 30 capsule, Rfl: 3 .  gabapentin (NEURONTIN) 400 MG capsule, 400/800/800 for 2 weeks and then 800 mg three times a day, Disp: 180 capsule, Rfl: 3 .  lisinopril (PRINIVIL,ZESTRIL) 20 MG tablet, Take by mouth., Disp: , Rfl:  .  NONFORMULARY OR COMPOUNDED ITEM, See pharmacy note, Disp: 120 each, Rfl: 2 .  pramipexole (MIRAPEX) 1 MG tablet, Take 1 mg by mouth 3 (three) times daily., Disp: , Rfl:  .  Pramipexole Dihydrochloride 1.5 MG TB24, Take by mouth., Disp: , Rfl:  .  tiZANidine (ZANAFLEX) 2 MG tablet, Take 1 tablet (2 mg total) by mouth 2 (two) times daily as needed for muscle spasms., Disp: 60 tablet, Rfl: 4 .  apixaban (ELIQUIS) 5 MG TABS tablet, Take 5 mg by mouth 2 (two) times daily. , Disp: , Rfl:  .  diclofenac (VOLTAREN) 75 MG EC tablet, Take 1 tablet (75 mg total) by mouth 2 (two) times daily. (Patient not taking: Reported  on 04/24/2017), Disp: 60 tablet, Rfl: 0 .  gentamicin cream (GARAMYCIN) 0.1 %, Apply 1 application topically 3 (three) times daily. (Patient not taking: Reported on 04/24/2017), Disp: 30 g, Rfl: 1 .  HYDROcodone-acetaminophen (NORCO/VICODIN) 5-325 MG tablet, Take 1 tablet by mouth every 6 (six) hours as needed for moderate pain. (Patient not taking: Reported on 03/26/2017), Disp: 30 tablet, Rfl: 0 .  hyoscyamine (LEVBID) 0.375 MG 12 hr tablet, Take by mouth., Disp: , Rfl:  .  levothyroxine (SYNTHROID, LEVOTHROID) 75 MCG tablet, TAKE 1 TABLET EVERY DAY ON AN EMPTY STOMACH WITH WATER 30-60 MINUTES BEFORE BREAKFAST, Disp: , Rfl:  .  Omega-3 Fatty Acids (FISH OIL) 1000 MG CAPS, Take 1,000 mg by mouth daily., Disp: , Rfl:  .  pregabalin (LYRICA) 100 MG capsule, Take by mouth., Disp: , Rfl:  .  spironolactone (ALDACTONE) 50 MG tablet, Take 50 mg by mouth daily., Disp: , Rfl: 98 .  traMADol (ULTRAM) 50 MG tablet, , Disp: , Rfl:   Current Facility-Administered Medications:  .  betamethasone acetate-betamethasone sodium phosphate (CELESTONE) injection 3 mg, 3 mg, Intramuscular, Once, Evans, Brent M, DPM .  betamethasone acetate-betamethasone sodium phosphate (CELESTONE) injection 3 mg, 3 mg, Intramuscular, Once, Evans, Dorathy Daft, DPM  ROS  Constitutional: Denies any fever or chills Gastrointestinal: No reported hemesis, hematochezia, vomiting, or acute GI distress Musculoskeletal: Denies any acute onset joint swelling, redness, loss of ROM, or weakness Neurological: No reported episodes of acute onset apraxia, aphasia, dysarthria, agnosia, amnesia, paralysis, loss of coordination, or loss of consciousness  Allergies  Ms. Riese has No Known Allergies.  [place]  Drug: Ms. Loudin  reports that she does not use drugs. Alcohol:  reports that she drinks alcohol. Tobacco:  reports that  has never smoked. she has never used smokeless tobacco. Medical:  has a past medical history of Breast cancer, right ([place])  (2011), Hypertension, Hypothyroidism, and Sleep apnea. Surgical: Ms. Lupien  has a past surgical history that includes Cardiac catheterization (N/A, 11/25/2014); Breast surgery; Cholecystectomy; Appendectomy; and Abdominal hysterectomy. Family: family history includes Cancer in her mother and sister; Depression in her father; Emphysema in her father; Heart disease in her father; Hypertension in her father, mother, and sister.  Constitutional Exam  General appearance: Well nourished, well developed, and well hydrated. In no apparent acute distress Vitals:   07/03/17 1007  BP: (!) 141/87  Pulse: 94  Resp: 16  Temp:  97.7 F (36.5 C)  TempSrc: Oral  SpO2: 98%  Weight: 210 lb (95.3 kg)  Height: '5\' 7"'$  (1.702 m)   BMI Assessment: Estimated body mass index is 32.89 kg/m as calculated from the following:   Height as of this encounter: '5\' 7"'$  (1.702 m).   Weight as of this encounter: 210 lb (95.3 kg).  BMI interpretation table: BMI level Category Range association with higher incidence of chronic pain  <18 kg/m2 Underweight   18.5-24.9 kg/m2 Ideal body weight   25-29.9 kg/m2 Overweight Increased incidence by 20%  30-34.9 kg/m2 Obese (Class I) Increased incidence by 68%  35-39.9 kg/m2 Severe obesity (Class II) Increased incidence by 136%  >40 kg/m2 Extreme obesity (Class III) Increased incidence by 254%   BMI Readings from Last 4 Encounters:  07/03/17 32.89 kg/m  05/22/17 32.89 kg/m  04/24/17 32.89 kg/m  03/26/17 30.41 kg/m   Wt Readings from Last 4 Encounters:  07/03/17 210 lb (95.3 kg)  05/22/17 210 lb (95.3 kg)  04/24/17 210 lb (95.3 kg)  03/26/17 200 lb (90.7 kg)  Psych/Mental status: Alert, oriented x 3 (person, place, & time)       Eyes: PERLA Respiratory: No evidence of acute respiratory distress  Cervical Spine Area Exam  Skin & Axial Inspection: No masses, redness, edema, swelling, or associated skin lesions Alignment: Symmetrical Functional ROM: Unrestricted ROM       Stability: No instability detected Muscle Tone/Strength: Functionally intact. No obvious neuro-muscular anomalies detected. Sensory (Neurological): Unimpaired Palpation: No palpable anomalies                     Upper Extremity (UE) Exam    Side: Right upper extremity  Side: Left upper extremity   Skin & Extremity Inspection: Skin color, temperature, and hair growth are WNL. No peripheral edema or cyanosis. No masses, redness, swelling, asymmetry, or associated skin lesions. No contractures.  Skin & Extremity Inspection: Skin color, temperature, and hair growth are WNL. No peripheral edema or cyanosis. No masses, redness, swelling, asymmetry, or associated skin lesions. No contractures.   Functional ROM: Unrestricted ROM          Functional ROM: Unrestricted ROM           Muscle Tone/Strength: Functionally intact. No obvious neuro-muscular anomalies detected.  Muscle Tone/Strength: Functionally intact. No obvious neuro-muscular anomalies detected.   Sensory (Neurological): Unimpaired          Sensory (Neurological): Unimpaired           Palpation: No palpable anomalies              Palpation: No palpable anomalies               Specialized Test(s): Deferred         Specialized Test(s): Deferred           Thoracic Spine Area Exam  Skin & Axial Inspection: No masses, redness, or swelling Alignment: Symmetrical Functional ROM: Unrestricted ROM Stability: No instability detected Muscle Tone/Strength: Functionally intact. No obvious neuro-muscular anomalies detected. Sensory (Neurological): Unimpaired Muscle strength & Tone: No palpable anomalies  Lumbar Spine Area Exam  Skin & Axial Inspection: No masses, redness, or swelling Alignment: Symmetrical Functional ROM: Unrestricted ROM      Stability: No instability detected Muscle Tone/Strength: Functionally intact. No obvious neuro-muscular anomalies detected. Sensory (Neurological): Unimpaired Palpation: No palpable anomalies        Provocative Tests: Lumbar Hyperextension and rotation test: evaluation deferred today  Lumbar Lateral bending test: evaluation deferred today       Patrick's Maneuver: evaluation deferred today                    Gait & Posture Assessment  Ambulation: Unassisted Gait: Relatively normal for age and body habitus Posture: WNL   Lower Extremity Exam    Side: Right lower extremity  Side: Left lower extremity  Skin & Extremity Inspection: Skin color, temperature, and hair growth are WNL. No peripheral edema or cyanosis. No masses, redness, swelling, asymmetry, or associated skin lesions. No contractures.  Skin & Extremity Inspection: Skin color, temperature, and hair growth are WNL. No peripheral edema or cyanosis. No masses, redness, swelling, asymmetry, or associated skin lesions. No contractures.  Functional ROM: Unrestricted ROM          Functional ROM: Unrestricted ROM          Muscle Tone/Strength: Functionally intact. No obvious neuro-muscular anomalies detected.  Muscle Tone/Strength: Functionally intact. No obvious neuro-muscular anomalies detected.  Sensory (Neurological): Unimpaired  Sensory (Neurological): Unimpaired  Palpation: No palpable anomalies  Palpation: No palpable anomalies     Assessment  Primary Diagnosis & Pertinent Problem List: The primary encounter diagnosis was Neuropathy. Diagnoses of Leg pain, bilateral, Achilles tendinosis, RLS (restless legs syndrome), and Chronic deep vein thrombosis (DVT) of proximal vein of lower extremity, unspecified laterality ([place]) were also pertinent to this visit.  Status Diagnosis  Stable Persistent Persistent 1. Neuropathy   2. Leg pain, bilateral   3. Achilles tendinosis   4. RLS (restless legs syndrome)   5. Chronic deep vein thrombosis (DVT) of proximal vein of lower extremity, unspecified laterality ([place])      General Recommendations: The pain condition that the patient suffers from is best treated with a  multidisciplinary approach that involves an increase in physical activity to prevent de-conditioning and worsening of the pain cycle, as well as psychological counseling (formal and/or informal) to address the co-morbid psychological affects of pain. Treatment will often involve judicious use of pain medications and interventional procedures to decrease the pain, allowing the patient to participate in the physical activity that will ultimately produce long-lasting pain reductions. The goal of the multidisciplinary approach is to return the patient to a higher level of overall function and to restore their ability to perform activities of daily living.  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 Patient has a history of DVT and is currently anticoagulated on apixaban. Not a candidate for any interventional procedures.  Treatment options in the past have included non-opioid analgesics, physical therapy, psychotherapy given her symptoms of depression. I discussed with her that opioid analgesics may not be a good therapeutic option for her given their ineffectiveness in the past and her history of narcolepsy. Patient also has obstructive sleep apnea, another reason why opioid therapy is not a good choice for her for chronic pain.  Since the patient's last visit, she was started on Cymbalta and has titrated that up to 60 mg daily.  She does endorse improvement in her depressive symptoms.  She endorses increased energy levels and improved outlook.  She has has been exercising approximately 3 times a week at her gym.  I am pleased with her increased functional status.  At this point I have recommended the patient continue her gabapentin, tizanidine, Cymbalta as prescribed.  She does not need any refills.  She states  that she will contact me after her  appointment with her psychiatrist this upcoming Thursday to let me know about the plan.  We will determine follow-up at that time.  Overall I am pleased with her progress and functional improvement.     Future Appointments  Date Time Provider [place]  07/17/2017  9:15 AM Edrick Kins, DPM [facility]  Time Note: Greater than 50% of the 10 minute(s) of face-to-face time spent with Ms. Rembert, was spent in counseling/coordination of care regarding: the treatment plan, the risks and possible complications of proposed treatment, medication side effects and realistic expectations.   Primary Care Physician: Ricardo Jericho, NP Location: [facility] Note by: Gillis Santa, M.D Date: 07/03/2017; Time: 10:34 AM  There are no Patient Instructions on file for this visit.

## 2017-07-17 ENCOUNTER — Ambulatory Visit: Payer: Medicare HMO | Admitting: Podiatry

## 2017-07-20 ENCOUNTER — Ambulatory Visit (INDEPENDENT_AMBULATORY_CARE_PROVIDER_SITE_OTHER): Payer: Medicare HMO | Admitting: Podiatry

## 2017-07-20 ENCOUNTER — Encounter: Payer: Self-pay | Admitting: Podiatry

## 2017-07-20 DIAGNOSIS — M7752 Other enthesopathy of left foot: Secondary | ICD-10-CM | POA: Diagnosis not present

## 2017-07-20 DIAGNOSIS — M7662 Achilles tendinitis, left leg: Secondary | ICD-10-CM

## 2017-07-20 DIAGNOSIS — M6788 Other specified disorders of synovium and tendon, other site: Secondary | ICD-10-CM

## 2017-07-20 NOTE — Patient Instructions (Signed)
Pre-Operative Instructions  Congratulations, you have decided to take an important step towards improving your quality of life.  You can be assured that the doctors and staff at [facility] will be with you every step of the way.  Here are some important things you should know:  1. Plan to be at the surgery center/hospital at least 1 (one) hour prior to your scheduled time, unless otherwise directed by the surgical center/hospital staff.  You must have a responsible adult accompany you, remain during the surgery and drive you home.  Make sure you have directions to the surgical center/hospital to ensure you arrive on time. 2. If you are having surgery at [facility] or [facility] hospitals, you will need a copy of your medical history and physical form from your family physician within one month prior to the date of surgery. We will give you a form for your primary physician to complete.  3. We make every effort to accommodate the date you request for surgery.  However, there are times where surgery dates or times have to be moved.  We will contact you as soon as possible if a change in schedule is required.   4. No aspirin/ibuprofen for one week before surgery.  If you are on aspirin, any non-steroidal anti-inflammatory medications (Mobic, Aleve, Ibuprofen) should not be taken seven (7) days prior to your surgery.  You make take Tylenol for pain prior to surgery.  5. Medications - If you are taking daily heart and blood pressure medications, seizure, reflux, allergy, asthma, anxiety, pain or diabetes medications, make sure you notify the surgery center/hospital before the day of surgery so they can tell you which medications you should take or avoid the day of surgery. 6. No food or drink after midnight the night before surgery unless directed otherwise by surgical center/hospital staff. 7. No alcoholic beverages 24-hours prior to surgery.  No smoking 24-hours prior or 24-hours after  surgery. 8. Wear loose pants or shorts. They should be loose enough to fit over bandages, boots, and casts. 9. Don't wear slip-on shoes. Sneakers are preferred. 10. Bring your boot with you to the surgery center/hospital.  Also bring crutches or a walker if your physician has prescribed it for you.  If you do not have this equipment, it will be provided for you after surgery. 11. If you have not been contacted by the surgery center/hospital by the day before your surgery, call to confirm the date and time of your surgery. 12. Leave-time from work may vary depending on the type of surgery you have.  Appropriate arrangements should be made prior to surgery with your employer. 13. Prescriptions will be provided immediately following surgery by your doctor.  Fill these as soon as possible after surgery and take the medication as directed. Pain medications will not be refilled on weekends and must be approved by the doctor. 14. Remove nail polish on the operative foot and avoid getting pedicures prior to surgery. 15. Wash the night before surgery.  The night before surgery wash the foot and leg well with water and the antibacterial soap provided. Be sure to pay special attention to beneath the toenails and in between the toes.  Wash for at least three (3) minutes. Rinse thoroughly with water and dry well with a towel.  Perform this wash unless told not to do so by your physician.  Enclosed: 1 Ice pack (please put in freezer the night before surgery)   1 Hibiclens skin cleaner     Pre-op instructions  If you have any questions regarding the instructions, please do not hesitate to call our office.  [place]: [address], [place], [place] [address] -- [phone]  [place]: [address] [place], [place] [address] -- [phone]  [place]: [address]  [place], [place] [address] -- [phone]  [place]: [address], [address], [place], [place] [address] -- [phone]  Website:  [email or web address] 

## 2017-07-22 NOTE — Progress Notes (Signed)
Patient ID: Karen [name], female   DOB: 12-30-1950, 66 y.o.   MRN: [id number]   HPI:  67 year old female presents the office today for follow-up evaluation regarding left achilles tendinitis. She reports continued pain. She states the injection helped alleviate the pain for about three weeks. She has been seen by Vascular for bilateral DVTs and states her Eliquis has now been discontinued. Patient is here for further evaluation and treatment.    Past Medical History:  Diagnosis Date  . Breast cancer, right ([place]) 2011   Right Lumpectomy and Rad tx's.  Marland Kitchen Hypertension   . Hypothyroidism   . Sleep apnea       Physical Exam: General: The patient is alert and oriented x3 in no acute distress.  Dermatology: Skin is warm, dry and supple bilateral lower extremities. Negative for open lesions or macerations.  Vascular: Palpable pedal pulses bilaterally. No edema or erythema noted. Capillary refill within normal limits.  Neurological: Epicritic and protective threshold grossly intact bilaterally.   Musculoskeletal Exam: Large palpable mass noted to the posterior heel of the left lower extremity. This appears soft tissue in nature. There is diffuse edema noted throughout the posterior ankle joint. Muscle strength 5/5 in all muscle groups left lower extremity including plantar flexion. Achilles tendon appears to be intact. Hammertoe contracture deformity also noted digits 2-3 right foot with clinical evidence of bunion deformity right foot   Assessment: 1. Achilles tendinitis left  2. Retrocalcaneal bursitis left   Plan of Care:  1. Patient was evaluated.  2. Today we discussed the conservative versus surgical management of the presenting pathology. The patient opts for surgical management. All possible complications and details of the procedure were explained. All patient questions were answered. No guarantees were expressed or implied. 3. Authorization for surgery was initiated today.  Surgery will consist of retrocalcaneal exostectomy left. Repair of achilles left. Application of fiberglass cast left. 4. Return to clinic 1 week post op.    Edrick Kins, DPM [facility]  Dr. Edrick Kins, DPM    [address], [place] [address]                Office [phone]  Fax [phone]

## 2017-07-24 ENCOUNTER — Telehealth: Payer: Self-pay | Admitting: *Deleted

## 2017-07-24 NOTE — Telephone Encounter (Signed)
"  I'm a patient of Dr. Amalia Hailey, looking to schedule surgery.  Please give me a call, thank you."

## 2017-07-25 NOTE — Telephone Encounter (Signed)
I'm returning your call.  You want to schedule surgery?  "Yes, I do."  Do you have a date in mind?  He does surgeries on Thursdays.  "No, whatever his next available is."  He can do your surgery on August 23, 2017.  "That date will be fine."  You can register with the surgical center now.  The the instructions are in the brochure that was given to you.  Someone from the surgical center will call and give you the arrival time a day or two prior to the surgery date.  "Can you let me know if there is a cancellation because I would like to move up if possible?"  Yes, I can put you on a waiting list.  "Thank you so much."

## 2017-07-27 ENCOUNTER — Encounter: Payer: Self-pay | Admitting: Podiatry

## 2017-08-02 DIAGNOSIS — M66872 Spontaneous rupture of other tendons, left ankle and foot: Secondary | ICD-10-CM | POA: Diagnosis not present

## 2017-08-02 DIAGNOSIS — M7732 Calcaneal spur, left foot: Secondary | ICD-10-CM | POA: Diagnosis not present

## 2017-08-07 ENCOUNTER — Telehealth: Payer: Self-pay | Admitting: Podiatry

## 2017-08-07 NOTE — Telephone Encounter (Signed)
This patient called to the office saying she had foot surgery on Thursday by Dr.  Amalia Hailey.  She says she was promised a scotter to get around after her surgery and never received her scooter.  She says she called on Friday and there was no response from the office.  She says she is having difficulty using the crutches with her cast.   I told her she would need to call on Monday to receive the scooter.    Gardiner Barefoot DPM

## 2017-08-08 ENCOUNTER — Encounter: Payer: Self-pay | Admitting: Podiatry

## 2017-08-08 MED ORDER — FLUCONAZOLE 150 MG PO TABS: 150.0000 mg | ORAL_TABLET | Freq: Once | ORAL | 0 refills | Status: AC

## 2017-08-10 ENCOUNTER — Ambulatory Visit (INDEPENDENT_AMBULATORY_CARE_PROVIDER_SITE_OTHER): Payer: Medicare HMO | Admitting: Podiatry

## 2017-08-10 ENCOUNTER — Encounter: Payer: Medicare HMO | Admitting: Podiatry

## 2017-08-10 ENCOUNTER — Other Ambulatory Visit: Payer: Self-pay | Admitting: Podiatry

## 2017-08-10 ENCOUNTER — Ambulatory Visit (INDEPENDENT_AMBULATORY_CARE_PROVIDER_SITE_OTHER): Payer: Medicare HMO

## 2017-08-10 DIAGNOSIS — Z9889 Other specified postprocedural states: Secondary | ICD-10-CM

## 2017-08-10 DIAGNOSIS — M7752 Other enthesopathy of left foot: Secondary | ICD-10-CM | POA: Diagnosis not present

## 2017-08-13 NOTE — Progress Notes (Signed)
   Subjective:  Patient presents today status post retrocalcaneal exostectomy left. DOS: 08/02/17. She states she is doing well. She denies any complaints at this time. Patient is here for further evaluation and treatment.    Past Medical History:  Diagnosis Date  . Breast cancer, right ([place]) 2011   Right Lumpectomy and Rad tx's.  Marland Kitchen Hypertension   . Hypothyroidism   . Sleep apnea       Objective/Physical Exam Cast left intact. Neurovascularly intact. Negative pain associated to LLE.   Radiographic Exam:  Osteotomies sites appear to be stable with routine healing.  Assessment: 1. s/p retrocalcaneal exostectomy. DOS: 08/02/17   Plan of Care:  1. Patient was evaluated. X-rays reviewed 2. Cast left intact. 3. Continue nonweightbearing with knee scooter.  4. Return to clinic in 1 week for cast removal.    Edrick Kins, DPM [facility]  Dr. Edrick Kins, [address]                                        [place], [place] [address]                Office [phone]  Fax [phone]

## 2017-08-17 ENCOUNTER — Encounter: Payer: Self-pay | Admitting: Podiatry

## 2017-08-17 ENCOUNTER — Ambulatory Visit (INDEPENDENT_AMBULATORY_CARE_PROVIDER_SITE_OTHER): Payer: Medicare HMO | Admitting: Podiatry

## 2017-08-17 DIAGNOSIS — M7662 Achilles tendinitis, left leg: Secondary | ICD-10-CM

## 2017-08-17 DIAGNOSIS — Z9889 Other specified postprocedural states: Secondary | ICD-10-CM

## 2017-08-17 DIAGNOSIS — M7752 Other enthesopathy of left foot: Secondary | ICD-10-CM

## 2017-08-20 NOTE — Progress Notes (Signed)
   Subjective:  Patient presents today status post retrocalcaneal exostectomy left. DOS: 08/02/17. She states she is doing well overall. She reports that her incisions look well. She denies any complaints at this time. Patient is here for further evaluation and treatment.    Past Medical History:  Diagnosis Date  . Breast cancer, right ([place]) 2011   Right Lumpectomy and Rad tx's.  Marland Kitchen Hypertension   . Hypothyroidism   . Sleep apnea       Objective/Physical Exam Neurovascular status intact.  Skin incisions appear to be well coapted with sutures and staples intact. No sign of infectious process noted. No dehiscence. No active bleeding noted. Moderate edema noted to the surgical extremity.  Assessment: 1. s/p retrocalcaneal exostectomy. DOS: 08/02/17   Plan of Care:  1. Patient was evaluated. Cast removed today.  2. Sutures removed.  3. Tall CAM boot dispensed with heel lifts.  4. Continue nonweightbearing for two weeks.  5. Return to clinic in 2 weeks.    Edrick Kins, DPM [facility]  Dr. Edrick Kins, [address]                                        [place], [place] [address]                Office [phone]  Fax [phone]

## 2017-08-27 ENCOUNTER — Other Ambulatory Visit: Payer: Self-pay | Admitting: Student in an Organized Health Care Education/Training Program

## 2017-08-27 NOTE — Progress Notes (Signed)
DOS: 08-02-2017 Retrocalcaneal exostectomy left. Repair of achilles left. Application of fiberglass cast left.

## 2017-08-31 ENCOUNTER — Ambulatory Visit (INDEPENDENT_AMBULATORY_CARE_PROVIDER_SITE_OTHER): Payer: Medicare HMO | Admitting: Podiatry

## 2017-08-31 ENCOUNTER — Encounter: Payer: Self-pay | Admitting: Podiatry

## 2017-08-31 ENCOUNTER — Ambulatory Visit (INDEPENDENT_AMBULATORY_CARE_PROVIDER_SITE_OTHER): Payer: Medicare HMO

## 2017-08-31 DIAGNOSIS — M7752 Other enthesopathy of left foot: Secondary | ICD-10-CM

## 2017-08-31 DIAGNOSIS — L97422 Non-pressure chronic ulcer of left heel and midfoot with fat layer exposed: Secondary | ICD-10-CM

## 2017-08-31 MED ORDER — MUPIROCIN 2 % EX OINT: 1.0000 "application " | TOPICAL_OINTMENT | Freq: Two times a day (BID) | CUTANEOUS | 0 refills | Status: DC

## 2017-08-31 NOTE — Progress Notes (Signed)
   Subjective:  Patient presents today status post retrocalcaneal exostectomy left. DOS: 08/02/17.  Patient has minimal pain.  She has been strictly nonweightbearing to the lower extremity using a knee scooter and wearing the immobilization cam boot with a heel lift.  She presents today for further treatment and evaluation    Past Medical History:  Diagnosis Date  . Breast cancer, right ([place]) 2011   Right Lumpectomy and Rad tx's.  Marland Kitchen Hypertension   . Hypothyroidism   . Sleep apnea       Objective/Physical Exam Neurovascular status intact.  Skin incisions appear to be well coapted to the medial and lateral incision sites but there is an area of dehiscence to the central portion of the incision site measuring approximately 2 cm along the incision.  There is no exposed tendon bone ligament or joint.  Wound base and granulation tissue is pink.  No sign of infectious process noted. No dehiscence. No active bleeding noted.  Minimal edema noted to the surgical extremity.    Radiographic exam: Osteotomy site appears stable with routine healing.  No fracture noted.  Normal osseous mineralization noted.    Assessment: 1. s/p retrocalcaneal exostectomy left. DOS: 08/02/17 2.  Small localized area of dehiscence left posterior heel   Plan of Care:  1. Patient was evaluated.  2.  Light debridement of the fibrotic tissue of the wound base was performed using a tissue nipper.  Antibiotic cream and a Band-Aid was applied with an Ace wrap. 3.  Continue wearing the immobilization cam boot with a heel lift.  Patient can begin weightbearing to the surgical extremity. 4.  Culture was taken and sent to pathology for culture and sensitivity just to rule out any infection to the dehiscence site 5.  The patient has gentamicin cream at home.  Recommend daily application of gentamicin cream and a Band-Aid.  In case the patient does not have a antibiotic cream at home we will prescribe mupirocin ointment. 6.   Return to clinic in 2 weeks.  Once we are confident there is no infection we will proceed with wound care to get the dehiscence site to heal.  Edrick Kins, DPM [facility]  Dr. Edrick Kins, [address]                                        [place],  [place] [address]                Office [phone]  Fax [phone]

## 2017-09-06 ENCOUNTER — Encounter: Payer: Self-pay | Admitting: Podiatry

## 2017-09-07 LAB — WOUND CULTURE

## 2017-09-09 ENCOUNTER — Encounter: Payer: Self-pay | Admitting: Student in an Organized Health Care Education/Training Program

## 2017-09-10 ENCOUNTER — Encounter: Payer: Self-pay | Admitting: Podiatry

## 2017-09-10 MED ORDER — GABAPENTIN 800 MG PO TABS: 800.0000 mg | ORAL_TABLET | Freq: Two times a day (BID) | ORAL | 4 refills | Status: DC

## 2017-09-10 MED ORDER — DULOXETINE HCL 60 MG PO CPEP: 60.0000 mg | ORAL_CAPSULE | Freq: Every day | ORAL | 4 refills | Status: DC

## 2017-09-10 NOTE — Telephone Encounter (Signed)
Prescription for gabapentin 800 mg twice daily, Cymbalta 60 mg daily has been sent to pharmacy.  Prescription provided for 5 months.  Patient to follow with me in 4 months.  Requested Prescriptions   Signed Prescriptions Disp Refills  . gabapentin (NEURONTIN) 800 MG tablet 60 tablet 4    Sig: Take 1 tablet (800 mg total) by mouth 2 (two) times daily.    Authorizing Provider: Gillis Santa  . DULoxetine (CYMBALTA) 60 MG capsule 30 capsule 4    Sig: Take 1 capsule (60 mg total) by mouth daily.    Authorizing Provider: Gillis Santa

## 2017-09-11 ENCOUNTER — Encounter: Payer: Self-pay | Admitting: Podiatry

## 2017-09-11 MED ORDER — DOXYCYCLINE HYCLATE 100 MG PO TABS: 100.0000 mg | ORAL_TABLET | Freq: Two times a day (BID) | ORAL | 0 refills | Status: DC

## 2017-09-11 NOTE — Telephone Encounter (Signed)
Per Dr. Amalia Hailey, he has reviewed the pictures of heel and will send in rx for Doxycycline and see her sooner than original appt.   Patient has been contacted and is aware of rx.  She has been sent to scheduling for appt on Friday 09/14/17

## 2017-09-14 ENCOUNTER — Ambulatory Visit (INDEPENDENT_AMBULATORY_CARE_PROVIDER_SITE_OTHER): Payer: Medicare HMO | Admitting: Podiatry

## 2017-09-14 ENCOUNTER — Encounter: Payer: Self-pay | Admitting: Podiatry

## 2017-09-14 DIAGNOSIS — L97422 Non-pressure chronic ulcer of left heel and midfoot with fat layer exposed: Secondary | ICD-10-CM

## 2017-09-14 DIAGNOSIS — Z9889 Other specified postprocedural states: Secondary | ICD-10-CM

## 2017-09-18 ENCOUNTER — Encounter: Payer: Medicare HMO | Admitting: Podiatry

## 2017-09-18 ENCOUNTER — Encounter: Payer: Self-pay | Admitting: Podiatry

## 2017-09-21 ENCOUNTER — Ambulatory Visit (INDEPENDENT_AMBULATORY_CARE_PROVIDER_SITE_OTHER): Payer: Medicare HMO | Admitting: Podiatry

## 2017-09-21 ENCOUNTER — Encounter: Payer: Self-pay | Admitting: Podiatry

## 2017-09-21 DIAGNOSIS — L97422 Non-pressure chronic ulcer of left heel and midfoot with fat layer exposed: Secondary | ICD-10-CM

## 2017-09-21 DIAGNOSIS — Z9889 Other specified postprocedural states: Secondary | ICD-10-CM

## 2017-09-21 MED ORDER — DOXYCYCLINE HYCLATE 100 MG PO TABS: 100.0000 mg | ORAL_TABLET | Freq: Two times a day (BID) | ORAL | 0 refills | Status: DC

## 2017-09-21 MED ORDER — CIPROFLOXACIN HCL 500 MG PO TABS: 500.0000 mg | ORAL_TABLET | Freq: Two times a day (BID) | ORAL | 0 refills | Status: DC

## 2017-09-21 MED ORDER — FLUCONAZOLE 150 MG PO TABS: 150.0000 mg | ORAL_TABLET | Freq: Once | ORAL | 0 refills | Status: AC

## 2017-09-25 NOTE — Progress Notes (Signed)
   Subjective:  Patient presents today status post retrocalcaneal exostectomy left. DOS: 08/02/17.  Patient has minimal pain.  Patient states that there has been some yellowish drainage from the dehiscence site of the posterior heel.  There is also been some odor with redness going around the incision site which is been going up the leg.  She has been applying the gentamicin cream.  She just started her oral doxycycline 100 mg yesterday when she called into the office for concern of infection.  She presents today for further treatment and evaluation  Past Medical History:  Diagnosis Date  . Breast cancer, right ([place]) 2011   Right Lumpectomy and Rad tx's.  Marland Kitchen Hypertension   . Hypothyroidism   . Sleep apnea    Objective/Physical Exam Neurovascular status intact.  Skin incisions appear to be well coapted to the medial and lateral incision sites but there remains an area of dehiscence to the central portion of the incision site measuring approximately 3.5cm along the incision. There does appear to be exposed deep suture with possible tendon exposed within the wound site.  Wound base and granulation tissue is pink with some overlying necrotic, fibrotic debris.  Mild warmth with edema and erythema noted to the posterior heel and leg. No significant malodor noted. Periwound integrity is intact.  Assessment: 1. s/p retrocalcaneal exostectomy left. DOS: 08/02/17 2. Localized area of dehiscence left posterior heel   Plan of Care:  1. Patient was evaluated. Cultures reviewed today.  2.  Continue doxycycline 100mg  BID x 2 additional weeks. 3. Debridement performed including some deep suture and necrotic debris using a tissue nipper. Betadine applied periwound. Dry sterile dressing applied. 4. Continue weight bearing in immobilization CAM boot 5.  Recommend daily dressing changes including Betadine para wound with dry sterile dressing. 6.  Return to clinic in 1 week  Edrick Kins, DPM [facility]  Dr. Edrick Kins, [address]                                        [place], [place] [address]                Office [phone]  Fax [phone]

## 2017-09-28 ENCOUNTER — Ambulatory Visit (INDEPENDENT_AMBULATORY_CARE_PROVIDER_SITE_OTHER): Payer: Medicare HMO | Admitting: Podiatry

## 2017-09-28 ENCOUNTER — Encounter: Payer: Self-pay | Admitting: Podiatry

## 2017-09-28 DIAGNOSIS — Z9889 Other specified postprocedural states: Secondary | ICD-10-CM

## 2017-09-28 DIAGNOSIS — L97422 Non-pressure chronic ulcer of left heel and midfoot with fat layer exposed: Secondary | ICD-10-CM

## 2017-09-28 MED ORDER — COLLAGENASE 250 UNIT/GM EX OINT: 1.0000 "application " | TOPICAL_OINTMENT | Freq: Every day | CUTANEOUS | 1 refills | Status: DC

## 2017-10-01 NOTE — Progress Notes (Signed)
   Subjective:  Patient presents today status post retrocalcaneal exostectomy left. DOS: 08/02/17.  Patient states that the wound to the posterior heel continues to drain.  Today her left foot is red with some swelling.  She is currently taking doxycycline.  She has been changing the dressings 3 times per day.  Past Medical History:  Diagnosis Date  . Breast cancer, right ([place]) 2011   Right Lumpectomy and Rad tx's.  Marland Kitchen Hypertension   . Hypothyroidism   . Sleep apnea    Objective/Physical Exam Neurovascular status intact.  Skin incisions appear to be well coapted to the medial and lateral incision sites but there remains an area of dehiscence to the central portion of the incision site measuring approximately 3.0cm along the incision. There does appear to be exposed deep suture with possible tendon exposed within the wound site.  Wound base and granulation tissue is pink with some overlying necrotic, fibrotic debris.  Mild warmth with edema and erythema noted to the posterior heel and leg. No significant malodor noted. Periwound integrity is intact.  Assessment: 1. s/p retrocalcaneal exostectomy left. DOS: 08/02/17 2. Localized area of dehiscence left posterior heel   Plan of Care:  1. Patient was evaluated. Cultures reviewed today.  2.  Continue doxycycline 100mg  BID x 2 additional weeks.  Today a refill for doxycycline was provided with ciprofloxacin and a prescription for Diflucan for prophylactic yeast infection. 3.  Medically necessary excisional debridement including deep subcutaneous tissue was performed using a tissue nipper. 4.  At this point the patient can be weightbearing in a wedge shoe with a small heel lift 5.  Recommend daily dressing changes including Betadine para wound with dry sterile dressing. 6.  Return to clinic in 1 week  *Patient is going to [facility] for her granddaughter's first birthday in 3 weeks  Edrick Kins, DPM [facility]  Dr. Edrick Kins, [address]                                        [place], [place] [address]                Office [phone]  Fax [phone]

## 2017-10-08 NOTE — Progress Notes (Signed)
   Subjective:  Patient presents today status post retrocalcaneal exostectomy left. DOS: 08/02/17.  Patient states that the wound is about the same.  She is continuing to take doxycycline and ciprofloxacin.  She states that she does have some swelling with intermittent throbbing comes and goes.  She has been weightbearing in a shoe with a slight heel lift as instructed.  She believes that the cam boot she has been wearing was perhaps irritating and rubbing the back of her heel.  Past Medical History:  Diagnosis Date  . Breast cancer, right ([place]) 2011   Right Lumpectomy and Rad tx's.  Marland Kitchen Hypertension   . Hypothyroidism   . Sleep apnea    Objective/Physical Exam Neurovascular status intact.  Skin incisions appear to be well coapted to the medial and lateral incision sites but there remains an area of dehiscence to the central portion of the incision site measuring approximately 2.7 cm along the incision.  Negative for any exposed bone or tendon at the moment.  Wound base does have some intermittent fibrotic tissue noted.  There is no additional exposed suture.  Minimal amount of drainage noted.  No significant malodor noted. Periwound integrity is intact.  Assessment: 1. s/p retrocalcaneal exostectomy left. DOS: 08/02/17 2. Localized area of dehiscence left posterior heel   Plan of Care:  1. Patient was evaluated.  Continue taking doxycycline and ciprofloxacin until the prescription is finished.  No additional antibiotic should be warranted at that time. 2.  Today a prescription for Santyl ointment was called in to be applied daily with dry sterile dressing 3.  Continue weightbearing with a shoe that has a small heel lift to alleviate pressure from the Achilles tendon and ulceration 4.  Return to clinic in 1 week  *Patient is going to [place] for her granddaughter's first birthday in 2 weeks  Edrick Kins, DPM [facility]  Dr. Edrick Kins, [address]                                         [place], [place] [address]                Office [phone]  Fax [phone]

## 2017-10-09 ENCOUNTER — Ambulatory Visit (INDEPENDENT_AMBULATORY_CARE_PROVIDER_SITE_OTHER): Payer: Medicare HMO | Admitting: Podiatry

## 2017-10-09 ENCOUNTER — Encounter: Payer: Self-pay | Admitting: Podiatry

## 2017-10-09 DIAGNOSIS — Z9889 Other specified postprocedural states: Secondary | ICD-10-CM

## 2017-10-09 DIAGNOSIS — L97422 Non-pressure chronic ulcer of left heel and midfoot with fat layer exposed: Secondary | ICD-10-CM

## 2017-10-09 NOTE — Progress Notes (Signed)
   Subjective:  Patient presents today status post retrocalcaneal exostectomy left. DOS: 08/02/17. She states the wound looks improved but is slightly red. She has been applying the Santyl ointment as directed. She reports swelling to bilateral feet that is worse by the end of the day. She also complains of a hammertoe of the third digit of the right foot. Patient is here for further evaluation and treatment.   Past Medical History:  Diagnosis Date  . Breast cancer, right ([place]) 2011   Right Lumpectomy and Rad tx's.  Marland Kitchen Hypertension   . Hypothyroidism   . Sleep apnea    Objective/Physical Exam Neurovascular status intact. Skin incisions appear to be well coapted to the medial and lateral incision sites but there remains an area of dehiscence to the central portion of the incision site measuring approximately 2.0 cm along the incision. Negative for any exposed bone or tendon at the moment. Wound base does have some intermittent fibrotic tissue noted. There is no additional exposed suture. Minimal amount of drainage noted. No significant malodor noted. Periwound integrity is intact.  Assessment: 1. s/p retrocalcaneal exostectomy left. DOS: 08/02/17 2. Localized area of dehiscence left posterior heel - improved   Plan of Care:  1. Patient was evaluated. 2. Continue using Santyl daily with a dry sterile dressing.  3. Recommended compression stockings daily.  4. Continue wearing shoes with a heel lift.  5. May benefit from [place] boot when she gets back from [place].  6. Return to clinic in 2 weeks.   *Patient is going to [facility] for her granddaughter's first birthday in 2 weeks.  Edrick Kins, DPM [facility]  Dr. Edrick Kins, [address]                                        [place], [place] [address]                Office [phone]  Fax [phone]

## 2017-10-23 ENCOUNTER — Ambulatory Visit (INDEPENDENT_AMBULATORY_CARE_PROVIDER_SITE_OTHER): Payer: Medicare HMO | Admitting: Podiatry

## 2017-10-23 ENCOUNTER — Encounter: Payer: Self-pay | Admitting: Podiatry

## 2017-10-23 DIAGNOSIS — L97422 Non-pressure chronic ulcer of left heel and midfoot with fat layer exposed: Secondary | ICD-10-CM | POA: Diagnosis not present

## 2017-10-23 DIAGNOSIS — Z9889 Other specified postprocedural states: Secondary | ICD-10-CM

## 2017-10-23 MED ORDER — DOXYCYCLINE HYCLATE 100 MG PO TABS: 100.0000 mg | ORAL_TABLET | Freq: Two times a day (BID) | ORAL | 0 refills | Status: DC

## 2017-10-23 MED ORDER — FLUCONAZOLE 150 MG PO TABS: 150.0000 mg | ORAL_TABLET | Freq: Once | ORAL | 3 refills | Status: AC

## 2017-10-26 ENCOUNTER — Encounter: Payer: Self-pay | Admitting: Podiatry

## 2017-10-26 ENCOUNTER — Ambulatory Visit (INDEPENDENT_AMBULATORY_CARE_PROVIDER_SITE_OTHER): Payer: Medicare HMO | Admitting: Podiatry

## 2017-10-26 DIAGNOSIS — L97422 Non-pressure chronic ulcer of left heel and midfoot with fat layer exposed: Secondary | ICD-10-CM

## 2017-10-26 DIAGNOSIS — Z9889 Other specified postprocedural states: Secondary | ICD-10-CM

## 2017-10-29 NOTE — Progress Notes (Signed)
   Subjective:  Patient presents today status post retrocalcaneal exostectomy left. DOS: 08/02/17. She reports greenish/yellow drainage of the wound through the unna boot. She states the swelling has improved. She has been taking Doxycycline as directed. There are no modifying factors noted. Patient is here for further evaluation and treatment.   Past Medical History:  Diagnosis Date  . Breast cancer, right ([place]) 2011   Right Lumpectomy and Rad tx's.  Marland Kitchen Hypertension   . Hypothyroidism   . Sleep apnea    Objective/Physical Exam Neurovascular status intact. Skin incisions appear to be well coapted to the medial and lateral incision sites but there remains an area of dehiscence to the central portion of the incision site measuring approximately 2.0 cm along the incision. Negative for any exposed bone or tendon at the moment. Wound base does have some intermittent fibrotic tissue noted. There is no additional exposed suture. Minimal amount of drainage noted. No significant malodor noted. Periwound integrity is intact.  Assessment: 1. s/p retrocalcaneal exostectomy left. DOS: 08/02/17 2. Localized area of dehiscence left posterior heel - improved   Plan of Care:  1. Patient was evaluated. 2. Lou[place]ta Par[place] boot changed today. Keep clean, dry and intact for one week.  3. Continue taking oral Doxycycline.  4. Return to clinic in one week for unna boot change.    Edrick Kins, DPM [facility]  Dr. Edrick Kins, [address]                                        [place], [place] [address]                Office [phone]  Fax [phone]

## 2017-11-02 ENCOUNTER — Encounter: Payer: Medicare HMO | Admitting: Podiatry

## 2017-11-04 NOTE — Progress Notes (Signed)
   Subjective:  Patient presents today status post retrocalcaneal exostectomy left. DOS: 08/02/17.  Patient recently returned from [facility] and she says that the wound is about the same.  There is been some minimal to slight improvement.  She has been applying the Santyl ointment as directed.  She is currently weightbearing in a shoe with a small heel lift.   Past Medical History:  Diagnosis Date  . Breast cancer, right ([place]) 2011   Right Lumpectomy and Rad tx's.  Marland Kitchen Hypertension   . Hypothyroidism   . Sleep apnea    Objective/Physical Exam Neurovascular status intact. Skin incisions appear to be well coapted to the medial and lateral incision sites but there remains an area of dehiscence to the central portion of the incision site measuring approximately 2.0 cm along the incision. Negative for any exposed bone or tendon at the moment. Wound base does have some intermittent fibrotic tissue noted. There is no additional exposed suture. Minimal amount of drainage noted. No significant malodor noted. Periwound integrity is intact.  Assessment: 1. s/p retrocalcaneal exostectomy left. DOS: 08/02/17 2. Localized area of dehiscence left posterior heel - improved   Plan of Care:  1. Patient was evaluated. 2.  Today a multilayer Unna boot compression wrap was applied to the surgical extremity.  Continue weightbearing in a shoe with a small heel lift. 3.  Prescription for doxycycline 100 mg #28 for prophylactic antibiotics, since the ulceration site will be covered for 1 week 4.  Return to clinic in 1 week for dressing change  Edrick Kins, DPM [facility]  Dr. Edrick Kins, [address]                                        [place], [place] [address]                Office [phone]  Fax [phone]

## 2017-11-06 ENCOUNTER — Encounter: Payer: Self-pay | Admitting: Podiatry

## 2017-11-06 ENCOUNTER — Telehealth: Payer: Self-pay | Admitting: *Deleted

## 2017-11-06 ENCOUNTER — Ambulatory Visit (INDEPENDENT_AMBULATORY_CARE_PROVIDER_SITE_OTHER): Payer: Medicare HMO | Admitting: Podiatry

## 2017-11-06 DIAGNOSIS — L97422 Non-pressure chronic ulcer of left heel and midfoot with fat layer exposed: Secondary | ICD-10-CM

## 2017-11-06 DIAGNOSIS — Z9889 Other specified postprocedural states: Secondary | ICD-10-CM

## 2017-11-06 MED ORDER — TRAMADOL HCL 50 MG PO TABS: 50.0000 mg | ORAL_TABLET | Freq: Three times a day (TID) | ORAL | 0 refills | Status: DC | PRN

## 2017-11-06 MED ORDER — DOXYCYCLINE HYCLATE 100 MG PO TABS: 100.0000 mg | ORAL_TABLET | Freq: Two times a day (BID) | ORAL | 0 refills | Status: DC

## 2017-11-06 NOTE — Patient Instructions (Signed)
Pre-Operative Instructions  Congratulations, you have decided to take an important step towards improving your quality of life.  You can be assured that the doctors and staff at [facility] will be with you every step of the way.  Here are some important things you should know:  1. Plan to be at the surgery center/hospital at least 1 (one) hour prior to your scheduled time, unless otherwise directed by the surgical center/hospital staff.  You must have a responsible adult accompany you, remain during the surgery and drive you home.  Make sure you have directions to the surgical center/hospital to ensure you arrive on time. 2. If you are having [facility] at [facility], you will need a copy of your medical history and physical form from your family physician within one month prior to the date of surgery. We will give you a form for your primary physician to complete.  3. We make every effort to accommodate the date you request for surgery.  However, there are times where surgery dates or times have to be moved.  We will contact you as soon as possible if a change in schedule is required.   4. No aspirin/ibuprofen for one week before surgery.  If you are on aspirin, any non-steroidal anti-inflammatory medications (Mobic, Aleve, Ibuprofen) should not be taken seven (7) days prior to your surgery.  You make take Tylenol for pain prior to surgery.  5. Medications - If you are taking daily heart and blood pressure medications, seizure, reflux, allergy, asthma, anxiety, pain or diabetes medications, make sure you notify the surgery center/hospital before the day of surgery so they can tell you which medications you should take or avoid the day of surgery. 6. No food or drink after midnight the night before surgery unless directed otherwise by surgical center/hospital staff. 7. No alcoholic beverages 24-hours prior to surgery.  No smoking 24-hours prior or 24-hours after  surgery. 8. Wear loose pants or shorts. They should be loose enough to fit over bandages, boots, and casts. 9. Don't wear slip-on shoes. Sneakers are preferred. 10. Bring your boot with you to the surgery center/hospital.  Also bring crutches or a walker if your physician has prescribed it for you.  If you do not have this equipment, it will be provided for you after surgery. 11. If you have not been contacted by the surgery center/hospital by the day before your surgery, call to confirm the date and time of your surgery. 12. Leave-time from work may vary depending on the type of surgery you have.  Appropriate arrangements should be made prior to surgery with your employer. 13. Prescriptions will be provided immediately following surgery by your doctor.  Fill these as soon as possible after surgery and take the medication as directed. Pain medications will not be refilled on weekends and must be approved by the doctor. 14. Remove nail polish on the operative foot and avoid getting pedicures prior to surgery. 15. Wash the night before surgery.  The night before surgery wash the foot and leg well with water and the antibacterial soap provided. Be sure to pay special attention to beneath the toenails and in between the toes.  Wash for at least three (3) minutes. Rinse thoroughly with water and dry well with a towel.  Perform this wash unless told not to do so by your physician.  Enclosed: 1 Ice pack (please put in freezer the night before surgery)   1 Hibiclens skin cleaner     Pre-op instructions  If you have any questions regarding the instructions, please do not hesitate to call our office.  [place]: [address], [place], [place] [address] -- [phone]  [place]: [address] [place], [place] [address] -- [phone]  [place]: [address]  [place], [place] [address] -- [phone]  [place]: [address], [address], [place], [place] [address] -- [phone]  Website:  [email or web address] 

## 2017-11-08 ENCOUNTER — Encounter: Payer: Self-pay | Admitting: Podiatry

## 2017-11-08 NOTE — Progress Notes (Signed)
   Subjective:  Patient presents today status post retrocalcaneal exostectomy left. DOS: 08/02/17. She states her condition is unchanged. She reports a moderate amount of associated swelling. She reports some continued drainage. She denies fever or chills. Patient is here for further evaluation and treatment.   Past Medical History:  Diagnosis Date  . Breast cancer, right ([place]) 2011   Right Lumpectomy and Rad tx's.  Marland Kitchen Hypertension   . Hypothyroidism   . Sleep apnea    Objective/Physical Exam Neurovascular status intact. Skin incisions appear to be well coapted to the medial and lateral incision sites but there remains an area of dehiscence to the central portion of the incision site measuring approximately 2.0 cm along the incision. Negative for any exposed bone or tendon at the moment. Wound base does have some intermittent fibrotic tissue noted. There is no additional exposed suture. Minimal amount of drainage noted. No significant malodor noted. Periwound integrity is intact.  Assessment: 1. s/p retrocalcaneal exostectomy left. DOS: 08/02/17 2. Localized area of dehiscence left posterior heel - improved   Plan of Care:  1. Patient was evaluated. 2. Today we discussed the conservative versus surgical management of the presenting pathology. The patient opts for surgical management. All possible complications and details of the procedure were explained. All patient questions were answered. No guarantees were expressed or implied. 3. Authorization for surgery was initiated today. Surgery will consist of revision repair of the left achilles. Possible primary closure of wound left. Possible application of wound vac left.  4. Continue using Santyl daily with a dry sterile dressing.  5. Refill prescription for Doxycycline provided to patient.  6. Prescription for Tramadol 50 mg #30 provided to patient.  7. Return to clinic in 10 days.     Edrick Kins, DPM [facility]  Dr.  Edrick Kins, [place]                                        [place], [place] [address]                Office [phone]  Fax [phone]

## 2017-11-09 NOTE — Telephone Encounter (Signed)
"  I'm calling to schedule my surgery with Dr. Amalia Hailey."  Dr. Rebekah Chesterfield next available is August 1.  "I can't wait that long.  I need this done as soon as possible.  I have a hole in the back of my leg."  I can see if I can get it scheduled on a different day other than his regular surgery date.  I'll try for June 5.  "That will be fine.  Please let me know."  I am calling to inform you of your surgery date.  We have you scheduled for 11/21/2017.  "I found out when I received a call from someone at the surgical center."

## 2017-11-16 ENCOUNTER — Encounter: Payer: Self-pay | Admitting: Podiatry

## 2017-11-16 ENCOUNTER — Telehealth: Payer: Self-pay | Admitting: *Deleted

## 2017-11-16 ENCOUNTER — Ambulatory Visit (INDEPENDENT_AMBULATORY_CARE_PROVIDER_SITE_OTHER): Payer: Medicare HMO | Admitting: Podiatry

## 2017-11-16 DIAGNOSIS — Z9889 Other specified postprocedural states: Secondary | ICD-10-CM

## 2017-11-16 DIAGNOSIS — L97422 Non-pressure chronic ulcer of left heel and midfoot with fat layer exposed: Secondary | ICD-10-CM

## 2017-11-16 NOTE — Telephone Encounter (Signed)
"  I got your faxed request for the wound vac.  I need more measurements from Dr. Amalia Hailey.  I need the width and depth of the wound."  You can call him on his mobile, he is in our [place] office today.  "Can you fax me the operative report?"  Yes, I will fax it to you.  "Will she be needing home health care?"  To my knowledge, she will not need home health care.  "Thank you so much, that's all I needed."  I faxed the operative report to Zenia Resides, [place] representative.

## 2017-11-19 ENCOUNTER — Telehealth: Payer: Self-pay | Admitting: *Deleted

## 2017-11-19 NOTE — Telephone Encounter (Signed)
"  This is Dawn with [employer].  I am calling to see if you know the surgery time for [facility].  We can't get the wound vac to her at home.  It has been approved.  We were going to take it to the surgery center if needed.  So I just needed to know what time her surgery is.  I know it's on Wednesday on the fifth.  If you don't mind, let me know once you figure that out if you don't know.

## 2017-11-20 ENCOUNTER — Telehealth: Payer: Self-pay | Admitting: *Deleted

## 2017-11-20 ENCOUNTER — Telehealth: Payer: Self-pay | Admitting: Podiatry

## 2017-11-20 LAB — WOUND CULTURE

## 2017-11-20 NOTE — Telephone Encounter (Signed)
Caren Griffins - [place] called to see if pt had any drainage, she is scheduled for surgery. I reviewed clinicals and 10/26/2017 states pt has minimal drainage. Caren Griffins states she will call Dr. Amalia Hailey for update status.

## 2017-11-20 NOTE — Telephone Encounter (Signed)
I left her a message that the patient's surgery will start at 12 noon at [facility].

## 2017-11-20 NOTE — Telephone Encounter (Signed)
This is Public affairs consultant from [employer]. I was calling to get the time of Karen [name]'s surgery for tomorrow so we can make sure we get the wound vac delivered to the surgical center. If you could call me back at [phone]. Thank you so much.

## 2017-11-20 NOTE — Telephone Encounter (Signed)
I called Dawn with [employer] back letting her know I was helping [name] to return some calls. I asked her the pt's name she was calling about as I had a hard time understanding it in the message. Dawn stated [name] had already given her a call back to let her know the surgery time on Karen [name]. I told her great and that I was just following up.

## 2017-11-21 ENCOUNTER — Other Ambulatory Visit: Payer: Self-pay | Admitting: *Deleted

## 2017-11-21 ENCOUNTER — Encounter: Payer: Self-pay | Admitting: Podiatry

## 2017-11-21 DIAGNOSIS — L97422 Non-pressure chronic ulcer of left heel and midfoot with fat layer exposed: Secondary | ICD-10-CM | POA: Diagnosis not present

## 2017-11-22 ENCOUNTER — Telehealth: Payer: Self-pay | Admitting: Podiatry

## 2017-11-22 NOTE — Progress Notes (Signed)
   Subjective:  Patient presents today status post retrocalcaneal exostectomy left. DOS: 08/02/17. She states her condition is unchanged. She reports a moderate amount of associated swelling.  She has been changing the dressings daily.  She is currently scheduled for surgical debridement of the ulceration on 11/21/2017.  She presents today for further treatment and evaluation.  Past Medical History:  Diagnosis Date  . Breast cancer, right ([place]) 2011   Right Lumpectomy and Rad tx's.  Marland Kitchen Hypertension   . Hypothyroidism   . Sleep apnea    Objective/Physical Exam Neurovascular status intact. Skin incisions appear to be well coapted to the medial and lateral incision sites but there remains an area of dehiscence to the central portion of the incision site measuring approximately 2.0 cm along the incision. Negative for any exposed bone or tendon at the moment. Wound base does have some intermittent fibrotic tissue noted. There is no additional exposed suture. Minimal amount of drainage noted. No significant malodor noted. Periwound integrity is intact.  Assessment: 1. s/p retrocalcaneal exostectomy left. DOS: 08/02/17 2. Localized area of dehiscence left posterior heel - improved   Plan of Care:  1. Patient was evaluated. 2.  Surgery scheduled for Wednesday, 11/21/2017 at which time she will undergo surgical debridement with possible primary closure.  If there is not the possibility for skin closure she will likely have negative pressure wound VAC therapy and good aggressive wound care. 3.  Today cultures were taken and sent to pathology for culture and sensitivity. 4.  Return to clinic 1 week postop   Edrick Kins, DPM [facility]  Dr. Edrick Kins, [place]                                        [place], [place] [address]                Office [phone]  Fax [phone]

## 2017-11-22 NOTE — Telephone Encounter (Signed)
I had surgery with Dr. Amalia Hailey yesterday. I'm calling about the Wound Vac pump and how to get someone out here to change it and everything. Please call me back at [phone]. Thank you.

## 2017-11-22 NOTE — Telephone Encounter (Signed)
I returned patient call and informed her that [facility] will be calling her sometime today to set up time for nurse visit.  I informed her that if she has not heard from them by end of day to call [name] to follow up.  She verbalized understanding

## 2017-11-23 ENCOUNTER — Telehealth: Payer: Self-pay | Admitting: *Deleted

## 2017-11-23 NOTE — Telephone Encounter (Signed)
"  Unfortunately, we are out of network with [employer].  I'm sorry we can't help.  I don't know who may be in-network with [employer]."  Thanks for checking.  I called and informed Karen [name].  I scheduled her for an appointment on Monday at the [facility] to see Karen [name] for a wound vac change.

## 2017-11-23 NOTE — Telephone Encounter (Signed)
"  I had surgery on Thursday and Dr. Amalia Hailey put on a wound vac. What am I to expect with that?"  Can you give me a little more detail?  "Has someone been arranged to come and change it or do I have to put up with over the weekend?"  I placed an order for [facility] to send out a nurse to change it.  "Do they do visits over the weekend?"  Yes, to my knowledge they do.  "Okay that is all I needed to know."  I called Gracy Racer at [facility].  She told me they are not in-network with [employer].  She said she would help me find someone.  I called and left a message with Rhys Martini of [facility] to call me back.    Tiffany called me and stated she would check and see if they are in-network with [facility].  She said they are with some plans.  She said she would call me back and let me know.  I called and informed Dr. Amalia Hailey of the issue that we are having finding a home health care agency in network with [facility].  He said it is fine if not because we can just have the patient come in twice a week and have it changed by our nurse, Ernestine Conrad.

## 2017-11-26 ENCOUNTER — Ambulatory Visit: Payer: Medicare HMO

## 2017-11-26 ENCOUNTER — Ambulatory Visit (INDEPENDENT_AMBULATORY_CARE_PROVIDER_SITE_OTHER): Payer: Medicare HMO

## 2017-11-26 ENCOUNTER — Telehealth: Payer: Self-pay | Admitting: *Deleted

## 2017-11-26 VITALS — BP 120/88 | HR 96 | Temp 98.0°F

## 2017-11-26 DIAGNOSIS — M810 Age-related osteoporosis without current pathological fracture: Secondary | ICD-10-CM | POA: Insufficient documentation

## 2017-11-26 DIAGNOSIS — M7662 Achilles tendinitis, left leg: Secondary | ICD-10-CM

## 2017-11-26 DIAGNOSIS — I10 Essential (primary) hypertension: Secondary | ICD-10-CM | POA: Insufficient documentation

## 2017-11-26 DIAGNOSIS — L97422 Non-pressure chronic ulcer of left heel and midfoot with fat layer exposed: Secondary | ICD-10-CM | POA: Diagnosis not present

## 2017-11-26 DIAGNOSIS — F32A Depression, unspecified: Secondary | ICD-10-CM | POA: Insufficient documentation

## 2017-11-26 DIAGNOSIS — C801 Malignant (primary) neoplasm, unspecified: Secondary | ICD-10-CM | POA: Insufficient documentation

## 2017-11-26 DIAGNOSIS — M6788 Other specified disorders of synovium and tendon, other site: Secondary | ICD-10-CM

## 2017-11-26 DIAGNOSIS — E785 Hyperlipidemia, unspecified: Secondary | ICD-10-CM | POA: Insufficient documentation

## 2017-11-26 DIAGNOSIS — F329 Major depressive disorder, single episode, unspecified: Secondary | ICD-10-CM | POA: Insufficient documentation

## 2017-11-26 DIAGNOSIS — N63 Unspecified lump in unspecified breast: Secondary | ICD-10-CM | POA: Insufficient documentation

## 2017-11-26 DIAGNOSIS — M7752 Other enthesopathy of left foot: Secondary | ICD-10-CM

## 2017-11-26 NOTE — Telephone Encounter (Signed)
-----   Message from Roney Jaffe, RN sent at 11/26/2017 11:33 AM EDT ----- Tivis Ringer, can you check on [facility] for wound vac changes, [name] already tried [facility] and they denied

## 2017-11-27 ENCOUNTER — Telehealth: Payer: Self-pay

## 2017-11-27 ENCOUNTER — Encounter: Payer: Medicare HMO | Admitting: Podiatry

## 2017-11-27 ENCOUNTER — Telehealth: Payer: Self-pay | Admitting: Podiatry

## 2017-11-27 NOTE — Telephone Encounter (Signed)
Faxed required form, clinicals, orders for wound vac change 3 x week and demographics to [place] [facility].

## 2017-11-27 NOTE — Telephone Encounter (Signed)
Patient called with questions about her wound vac. She states that there is not a lot of drainage coming from the wound, she does not see drainage in the tube. She did state that there is adequate suction and no leaks around the area. I advised her that a reduction in drainage is normal and that as long as there is adequate suction on the vac then there is not a need for concern. She is to call with any other questions or concerns and she has an appt for this Thursday to have the vac changed

## 2017-11-27 NOTE — Telephone Encounter (Signed)
Entered in error

## 2017-11-29 ENCOUNTER — Telehealth: Payer: Self-pay | Admitting: Podiatry

## 2017-11-29 ENCOUNTER — Ambulatory Visit: Payer: Medicare HMO

## 2017-11-29 DIAGNOSIS — L97422 Non-pressure chronic ulcer of left heel and midfoot with fat layer exposed: Secondary | ICD-10-CM

## 2017-11-29 NOTE — Telephone Encounter (Signed)
I'm a pt of Dr. Amalia [name] and I got a call yesterday that a nurse might come out but I have not heard anything. I have an appointment with Karen Conrad, RN this afternoon to change the bandage (wound vac). So I'm kind of concerned because there hasn't been a drop of fluid that has come out of the wound, its pretty dry. There is a hose and nothing has come into the canister. Also, it hurts me, so I don't know wether to wait for the home health nurse or to come in and see Karen [name] this afternoon. Please give me a call back at [phone]. Thank you.

## 2017-11-29 NOTE — Telephone Encounter (Addendum)
I informed pt, Karen Arab, RN would see her today afternoon and evaluate the wound vac and drainage, and I would contact [facility] to inform, that pt is aware they will call to establish her in there system. I told pt to look for a call from [place]. Pt states understanding.

## 2017-11-29 NOTE — Telephone Encounter (Signed)
Jackson Latino states due to staffing pt has been referred to [facility].

## 2017-11-29 NOTE — Telephone Encounter (Addendum)
[facility] states they do not have the [facility] referral for pt. I told Suanne Marker I had faxed to [phone] as requested by Dian Situ and had printed confirmation that it had been received. Suanne Marker state that may be [facility]'s fax, but to fax to their main office Nanine Means [phone], and she would take care of the pt's referral.

## 2017-11-30 DIAGNOSIS — M810 Age-related osteoporosis without current pathological fracture: Secondary | ICD-10-CM | POA: Diagnosis not present

## 2017-11-30 DIAGNOSIS — L97422 Non-pressure chronic ulcer of left heel and midfoot with fat layer exposed: Secondary | ICD-10-CM | POA: Diagnosis not present

## 2017-11-30 DIAGNOSIS — M1990 Unspecified osteoarthritis, unspecified site: Secondary | ICD-10-CM | POA: Diagnosis not present

## 2017-11-30 NOTE — Progress Notes (Signed)
Please see previous note.

## 2017-11-30 NOTE — Progress Notes (Signed)
Patient presents s/p revisional repair achilles tendon Lt foot, primary closure wound Lt DOS 11/21/17. She states that her foot hurts at times and the pain is burning and throbbing.   Removed wound vac located on the back of the heel, noted dehiscence of surgical wound measuring approx 2.25x1.5cm. No malodor, no tendon or bone exposed. Periwound skin is pink and healthy. Wound base appears to be granular, and did bleed when vac was removed. No s/s of infection. Patient is afebrile, denies fever, chills, n/v   Painted periwound with betadine and reapplied wound vac. Adequate suction was achieved. Wrapped foot in kerlix. She is to follow up on Thursday for a wound vac change, sooner if problems arise. We will be initiating HH to help with vac changes.

## 2017-12-03 NOTE — Progress Notes (Signed)
Patient presents s/p revisional repair achilles tendon Lt foot, primary closure wound Lt DOS 11/21/17. She states that her foot hurts at times and the pain is burning and throbbing.   Removed wound vac located on the back of the heel, noted dehiscence of surgical wound measuring approx 2.25x1.5cm. No malodor, no tendon or bone exposed. Periwound skin is pink and healthy. Wound base appears to be granular, and did bleed when vac was removed. No s/s of infection. Patient is afebrile, denies fever, chills, n/v     Painted periwound with betadine and reapplied wound vac. Adequate suction was achieved. Wrapped foot in kerlix. She is to follow up on Tuesday with Dr Amalia Hailey for a wound vac change, sooner if problems arise.

## 2017-12-04 ENCOUNTER — Encounter: Payer: Self-pay | Admitting: Podiatry

## 2017-12-04 ENCOUNTER — Ambulatory Visit (INDEPENDENT_AMBULATORY_CARE_PROVIDER_SITE_OTHER): Payer: Medicare HMO | Admitting: Podiatry

## 2017-12-04 DIAGNOSIS — M6788 Other specified disorders of synovium and tendon, other site: Secondary | ICD-10-CM

## 2017-12-04 DIAGNOSIS — M7662 Achilles tendinitis, left leg: Secondary | ICD-10-CM

## 2017-12-04 DIAGNOSIS — Z9889 Other specified postprocedural states: Secondary | ICD-10-CM

## 2017-12-07 NOTE — Progress Notes (Signed)
   Subjective:  Patient presents today status post revisional repair of the left achilles tendon with primary wound closure. DOS: 11/21/17. She states she is doing well. She is here to have the wound vac removed. Patient is here for further evaluation and treatment.    Past Medical History:  Diagnosis Date  . Breast cancer, right ([place]) 2011   Right Lumpectomy and Rad tx's.  Marland Kitchen Hypertension   . Hypothyroidism   . Sleep apnea       Objective/Physical Exam Wound #1 noted to the left heel measuring 1.5 x 2.5 x 0.3 cm.   To the above-noted ulceration, there is no eschar. There is a moderate amount of slough, fibrin and necrotic tissue. Granulation tissue and wound base is red. There is no malodor. There is a minimal amount of serosanginous drainage noted. Periwound integrity is intact.   Assessment: 1. Ulceration noted to the left heel secondary to wound dehiscence. DOS: 11/21/17   Plan of Care:  1. Patient was evaluated. 2. Medically necessary excisional debridement including subcutaneous tissue was performed using a tissue nipper and a chisel blade. Excisional debridement of all the necrotic nonviable tissue down to healthy bleeding viable tissue was performed with post-debridement measurements same as pre-. 3. The wound was cleansed and dry sterile dressing applied. 4. Continue using wound vac at home.  5. Request today for authorization to apply Epifix Skin Graft.  6. Return to clinic in one week.   Patient goes on vacation next week. Collagen and dry sterile dressing to be applied while on vacation.    Edrick Kins, DPM [facility]  Dr. Edrick Kins, [place]                                        [place], [place] [address]                Office [phone]  Fax [phone]

## 2017-12-11 ENCOUNTER — Ambulatory Visit (INDEPENDENT_AMBULATORY_CARE_PROVIDER_SITE_OTHER): Payer: Medicare HMO | Admitting: Podiatry

## 2017-12-11 ENCOUNTER — Encounter: Payer: Self-pay | Admitting: Podiatry

## 2017-12-11 DIAGNOSIS — L97422 Non-pressure chronic ulcer of left heel and midfoot with fat layer exposed: Secondary | ICD-10-CM

## 2017-12-13 NOTE — Progress Notes (Signed)
   Subjective:  Patient presents today status post revisional repair of the left achilles tendon with primary wound closure. DOS: 11/21/17. She states she was doing well with no complaints until last night. She reports a mild stinging sensation to the area since then. There are no modifying factors noted. Patient is here for further evaluation and treatment.    Past Medical History:  Diagnosis Date  . Breast cancer, right ([place]) 2011   Right Lumpectomy and Rad tx's.  Marland Kitchen Hypertension   . Hypothyroidism   . Sleep apnea       Objective/Physical Exam Wound #1 noted to the left heel measuring 0.9 x 1.0 x 0.3 cm.   To the above-noted ulceration, there is no eschar. There is a moderate amount of slough, fibrin and necrotic tissue. Granulation tissue and wound base is red. There is no malodor. There is a minimal amount of serosanginous drainage noted. Periwound integrity is intact.   Assessment: 1. Ulceration noted to the left heel secondary to wound dehiscence. DOS: 11/21/17   Plan of Care:  1. Patient was evaluated. 2. Medically necessary excisional debridement including subcutaneous tissue was performed using a tissue nipper and a chisel blade. Excisional debridement of all the necrotic nonviable tissue down to healthy bleeding viable tissue was performed with post-debridement measurements same as pre-. 3. The wound was cleansed and dry sterile dressing and collagen applied. 4. Hold wound vac for one week while patient goes to [place] for family reunion.  5. Resume wound vac upon return home. Dry sterile dressing and collagen daily while on vacation.  6. Authorization for EpiFix Skin Graft is pending.  7. Return to clinic in 10 days.    Edrick Kins, DPM [facility]  Dr. Edrick Kins, [place]                                        [place], [place] [address]                Office [phone]  Fax [phone]

## 2017-12-18 ENCOUNTER — Telehealth: Payer: Self-pay | Admitting: Podiatry

## 2017-12-18 NOTE — Telephone Encounter (Signed)
Faxed clinicals of 12/04/2017 to [facility].

## 2017-12-18 NOTE — Telephone Encounter (Signed)
This is [name] from [facility] for the wound vac on Karen [name]. I'm calling to check the updated wound measurement on her. I'm looking for the date range of 20 June - Present. Please also note the wound appearance, any eschar, or appearance of infection, or if there is any process of drainage. If you have time, please call us back at [phone] extension [phone] or you can send a clinical note on this pt to fax number [phone]. Thank you and you have a great day.

## 2017-12-28 ENCOUNTER — Ambulatory Visit (INDEPENDENT_AMBULATORY_CARE_PROVIDER_SITE_OTHER): Payer: Medicare HMO | Admitting: Podiatry

## 2017-12-28 DIAGNOSIS — L97422 Non-pressure chronic ulcer of left heel and midfoot with fat layer exposed: Secondary | ICD-10-CM

## 2017-12-30 NOTE — Progress Notes (Signed)
   Subjective:  Patient presents today status post revisional repair of the left achilles tendon with primary wound closure. DOS: 11/21/17. She states the wound has improved significantly. She denies any pain or new complaints at this time. Patient is here for further evaluation and treatment.    Past Medical History:  Diagnosis Date  . Breast cancer, right ([place]) 2011   Right Lumpectomy and Rad tx's.  Marland Kitchen Hypertension   . Hypothyroidism   . Sleep apnea       Objective/Physical Exam Wound #1 noted to the left heel measuring 0.8 x 0.9 x 0.2 cm.   To the above-noted ulceration, there is no eschar. There is a moderate amount of slough, fibrin and necrotic tissue. Granulation tissue and wound base is red. There is no malodor. There is a minimal amount of serosanginous drainage noted. Periwound integrity is intact.   Assessment: 1. Ulceration noted to the left heel secondary to wound dehiscence. DOS: 11/21/17   Plan of Care:  1. Patient was evaluated. 2. Medically necessary excisional debridement including subcutaneous tissue was performed using a tissue nipper and a chisel blade. Excisional debridement of all the necrotic nonviable tissue down to healthy bleeding viable tissue was performed with post-debridement measurements same as pre-. 3. The wound was cleansed and dry sterile dressing and collagen applied. 4. Continue using collagen and dry sterile dressing daily.  5. Continue to hold wound vac at the moment.  6. Return to clinic in 2 weeks.     Edrick Kins, DPM [facility]  Dr. Edrick Kins, [place]                                        [place],  [address]                Office [phone]  Fax [phone]

## 2018-01-15 ENCOUNTER — Ambulatory Visit (INDEPENDENT_AMBULATORY_CARE_PROVIDER_SITE_OTHER): Payer: Medicare HMO | Admitting: Podiatry

## 2018-01-15 ENCOUNTER — Encounter: Payer: Self-pay | Admitting: Podiatry

## 2018-01-15 DIAGNOSIS — L97422 Non-pressure chronic ulcer of left heel and midfoot with fat layer exposed: Secondary | ICD-10-CM

## 2018-01-20 NOTE — Progress Notes (Signed)
   Subjective:  Patient presents today status post revisional repair of the left achilles tendon with primary wound closure. DOS: 11/21/17. She states she is doing very well. She denies any pain or other complaints at this time. Patient is here for further evaluation and treatment.    Past Medical History:  Diagnosis Date  . Breast cancer, right ([place]) 2011   Right Lumpectomy and Rad tx's.  Marland Kitchen Hypertension   . Hypothyroidism   . Sleep apnea       Objective/Physical Exam Wound #1 noted to the left heel measuring 0.6 x 0.8 x 0.1 cm.   To the above-noted ulceration, there is no eschar. There is a moderate amount of slough, fibrin and necrotic tissue. Granulation tissue and wound base is red. There is no malodor. There is a minimal amount of serosanginous drainage noted. Periwound integrity is intact.   Assessment: 1. Ulceration noted to the left heel secondary to wound dehiscence. DOS: 11/21/17   Plan of Care:  1. Patient was evaluated. 2. Medically necessary excisional debridement including subcutaneous tissue was performed using a tissue nipper and a chisel blade. Excisional debridement of all the necrotic nonviable tissue down to healthy bleeding viable tissue was performed with post-debridement measurements same as pre-. 3. The wound was cleansed and dry sterile dressing and collagen applied. 4. Continue using collagen and dry sterile dressing daily.  5. Permanently discontinue using wound vac today.  6. Return to clinic in 3 weeks.     Edrick Kins, DPM [facility]  Dr. Edrick Kins, [place]                                        [place], [place] [address]                Office [phone]  Fax [phone]

## 2018-02-05 ENCOUNTER — Ambulatory Visit (INDEPENDENT_AMBULATORY_CARE_PROVIDER_SITE_OTHER): Payer: Medicare HMO | Admitting: Podiatry

## 2018-02-05 ENCOUNTER — Other Ambulatory Visit: Payer: Self-pay | Admitting: Podiatry

## 2018-02-05 ENCOUNTER — Ambulatory Visit (INDEPENDENT_AMBULATORY_CARE_PROVIDER_SITE_OTHER): Payer: Medicare HMO

## 2018-02-05 ENCOUNTER — Encounter: Payer: Self-pay | Admitting: Podiatry

## 2018-02-05 DIAGNOSIS — M2012 Hallux valgus (acquired), left foot: Secondary | ICD-10-CM | POA: Diagnosis not present

## 2018-02-05 DIAGNOSIS — M79672 Pain in left foot: Principal | ICD-10-CM

## 2018-02-05 DIAGNOSIS — M21611 Bunion of right foot: Secondary | ICD-10-CM

## 2018-02-05 DIAGNOSIS — M79671 Pain in right foot: Secondary | ICD-10-CM

## 2018-02-05 DIAGNOSIS — L97422 Non-pressure chronic ulcer of left heel and midfoot with fat layer exposed: Secondary | ICD-10-CM | POA: Diagnosis not present

## 2018-02-05 DIAGNOSIS — M21612 Bunion of left foot: Secondary | ICD-10-CM

## 2018-02-05 DIAGNOSIS — M2041 Other hammer toe(s) (acquired), right foot: Secondary | ICD-10-CM

## 2018-02-05 DIAGNOSIS — M2042 Other hammer toe(s) (acquired), left foot: Secondary | ICD-10-CM

## 2018-02-05 DIAGNOSIS — M2011 Hallux valgus (acquired), right foot: Secondary | ICD-10-CM

## 2018-02-12 NOTE — Progress Notes (Signed)
   Subjective:  Patient presents today status post revisional repair of the left achilles tendon with primary wound closure. DOS: 11/21/17. She states she is doing very well. She denies any pain or other complaints at this time. Patient is here for further evaluation and treatment. Patient also complains of painful symptomatic bilateral bunion deformities as well as hammertoes that is been ongoing for several years.  Patient has tried multiple conservative modalities but continues to experience pain associated with the deformities.  Patient states that once the ulceration to the posterior heel complete healing she would like to proceed with correction of bunion and hammertoe surgically.  Past Medical History:  Diagnosis Date  . Breast cancer, right ([place]) 2011   Right Lumpectomy and Rad tx's.  Marland Kitchen Hypertension   . Hypothyroidism   . Sleep apnea       Objective/Physical Exam Wound #1 noted to the left heel measuring 0.3 x 0.6 x 0.1 cm.   To the above-noted ulceration, there is no eschar. There is a moderate amount of slough, fibrin and necrotic tissue. Granulation tissue and wound base is red. There is no malodor. There is a minimal amount of serosanginous drainage noted. Periwound integrity is intact.    Radiographic exam: Increased intermetatarsal angle greater than 15 degrees noted to bilateral bunion deformities with hallux abductus angle greater than 30 degrees.  Hammertoe contracture noted to digits 2, 3 right foot as well as the third digits of the left foot.  Normal osseous mineralization.  No fractures or cortical disruptions noted.  Joints are otherwise preserved.  Assessment: 1. Ulceration noted to the left heel secondary to wound dehiscence. DOS: 11/21/17 2.  Bunion deformity bilateral 3.  Hammertoes 2, 3 right.  3 left.   Plan of Care:  1. Patient was evaluated. 2. Medically necessary excisional debridement including subcutaneous tissue was performed using a tissue nipper and a  chisel blade. Excisional debridement of all the necrotic nonviable tissue down to healthy bleeding viable tissue was performed with post-debridement measurements same as pre-. 3. The wound was cleansed and dry sterile dressing and collagen applied. 4. Continue using collagen and dry sterile dressing daily.  5.  Today we discussed conservative versus surgical management of the bunion deformity.  Return to clinic in 3 weeks to further discuss surgical correction and authorization for surgery.  Edrick Kins, DPM [facility]  Dr. Edrick Kins, [place]                                        [place], [place] [address]                Office [phone]  Fax [phone]

## 2018-02-23 ENCOUNTER — Other Ambulatory Visit: Payer: Self-pay | Admitting: Student in an Organized Health Care Education/Training Program

## 2018-02-26 ENCOUNTER — Encounter: Payer: Self-pay | Admitting: Podiatry

## 2018-02-26 ENCOUNTER — Ambulatory Visit (INDEPENDENT_AMBULATORY_CARE_PROVIDER_SITE_OTHER): Payer: Medicare HMO | Admitting: Podiatry

## 2018-02-26 DIAGNOSIS — M2041 Other hammer toe(s) (acquired), right foot: Secondary | ICD-10-CM | POA: Diagnosis not present

## 2018-02-26 DIAGNOSIS — M2042 Other hammer toe(s) (acquired), left foot: Secondary | ICD-10-CM | POA: Diagnosis not present

## 2018-02-26 DIAGNOSIS — M21611 Bunion of right foot: Secondary | ICD-10-CM

## 2018-02-26 DIAGNOSIS — M21612 Bunion of left foot: Secondary | ICD-10-CM | POA: Diagnosis not present

## 2018-02-26 NOTE — Patient Instructions (Signed)
Pre-Operative Instructions  Congratulations, you have decided to take an important step towards improving your quality of life.  You can be assured that the doctors and staff at [facility] will be with you every step of the way.  Here are some important things you should know:  1. Plan to be at the surgery center/hospital at least 1 (one) hour prior to your scheduled time, unless otherwise directed by the surgical center/hospital staff.  You must have a responsible adult accompany you, remain during the surgery and drive you home.  Make sure you have directions to the surgical center/hospital to ensure you arrive on time. 2. If you are having surgery at [facility] or [facility] hospitals, you will need a copy of your medical history and physical form from your family physician within one month prior to the date of surgery. We will give you a form for your primary physician to complete.  3. We make every effort to accommodate the date you request for surgery.  However, there are times where surgery dates or times have to be moved.  We will contact you as soon as possible if a change in schedule is required.   4. No aspirin/ibuprofen for one week before surgery.  If you are on aspirin, any non-steroidal anti-inflammatory medications (Mobic, Aleve, Ibuprofen) should not be taken seven (7) days prior to your surgery.  You make take Tylenol for pain prior to surgery.  5. Medications - If you are taking daily heart and blood pressure medications, seizure, reflux, allergy, asthma, anxiety, pain or diabetes medications, make sure you notify the surgery center/hospital before the day of surgery so they can tell you which medications you should take or avoid the day of surgery. 6. No food or drink after midnight the night before surgery unless directed otherwise by surgical center/hospital staff. 7. No alcoholic beverages 24-hours prior to surgery.  No smoking 24-hours prior or 24-hours after  surgery. 8. Wear loose pants or shorts. They should be loose enough to fit over bandages, boots, and casts. 9. Don't wear slip-on shoes. Sneakers are preferred. 10. Bring your boot with you to the surgery center/hospital.  Also bring crutches or a walker if your physician has prescribed it for you.  If you do not have this equipment, it will be provided for you after surgery. 11. If you have not been contacted by the surgery center/hospital by the day before your surgery, call to confirm the date and time of your surgery. 12. Leave-time from work may vary depending on the type of surgery you have.  Appropriate arrangements should be made prior to surgery with your employer. 13. Prescriptions will be provided immediately following surgery by your doctor.  Fill these as soon as possible after surgery and take the medication as directed. Pain medications will not be refilled on weekends and must be approved by the doctor. 14. Remove nail polish on the operative foot and avoid getting pedicures prior to surgery. 15. Wash the night before surgery.  The night before surgery wash the foot and leg well with water and the antibacterial soap provided. Be sure to pay special attention to beneath the toenails and in between the toes.  Wash for at least three (3) minutes. Rinse thoroughly with water and dry well with a towel.  Perform this wash unless told not to do so by your physician.  Enclosed: 1 Ice pack (please put in freezer the night before surgery)   1 Hibiclens skin cleaner     Pre-op instructions  If you have any questions regarding the instructions, please do not hesitate to call our office.  [place]: [address], [place], [place] [address] -- [phone]  [place]: [address] [place], [place] [address] -- [phone]  [place]: [address]  [place], [place] [address] -- [phone]  [place]: [address], [address], [place], [place] [address] -- [phone]  Website:  [email or web address] 

## 2018-02-27 NOTE — Progress Notes (Signed)
   Subjective:  Patient presents today status post revisional repair of the left achilles tendon with primary wound closure. DOS: 11/21/17. She states she is doing well. She is here to discuss surgery for her bunions and hammertoes. Patient is here for further evaluation and treatment.   Past Medical History:  Diagnosis Date  . Breast cancer, right ([place]) 2011   Right Lumpectomy and Rad tx's.  Marland Kitchen Hypertension   . Hypothyroidism   . Sleep apnea       Objective/Physical Exam General: The patient is alert and oriented x3 in no acute distress.  Dermatology: Wound noted to the left heel has healed. Complete re-epithelialization has occurred. No drainage noted. Skin is cool, dry and supple bilateral lower extremities. Negative for open lesions or macerations.  Vascular: Palpable pedal pulses bilaterally. No edema or erythema noted. Capillary refill within normal limits.  Neurological: Epicritic and protective threshold grossly intact bilaterally.   Musculoskeletal Exam: Clinical evidence of bunion deformity noted to the respective foot. There is a moderate pain on palpation range of motion of the first MPJ. Lateral deviation of the hallux noted consistent with hallux abductovalgus. Hammertoe contracture also noted on clinical exam to digits 2-4 of the right foot. Symptomatic pain on palpation and range of motion also noted to the metatarsal phalangeal joints of the respective hammertoe digits.    Assessment: 1. Ulceration noted to the left heel secondary to wound dehiscence. DOS: 11/21/17 - healed  2. Bunion deformity bilateral, right greater than left  3. Hammertoes 2-4 right     Plan of Care:  1. Patient was evaluated. 2. Today we discussed the conservative versus surgical management of the presenting pathology. The patient opts for surgical management. All possible complications and details of the procedure were explained. All patient questions were answered. No guarantees were expressed or  implied. 3. Authorization for surgery was initiated today. Surgery will consist of bunionectomy with metatarsal osteotomy right, PIPJ arthroplasty with MPJ capsulotomy digits 2-4 right.  4. Return to clinic one week post op.   Edrick Kins, DPM [facility]  Dr. Edrick Kins, [place]                                        [place], [place] [address]                Office [phone]  Fax [phone]

## 2018-03-01 DIAGNOSIS — R000 Tachycardia, unspecified: Secondary | ICD-10-CM | POA: Insufficient documentation

## 2018-03-06 ENCOUNTER — Telehealth: Payer: Self-pay | Admitting: *Deleted

## 2018-03-06 NOTE — Telephone Encounter (Signed)
"  I'm supposed to have surgery with Dr. Amalia Hailey.  I didn't know if I was to contact you or if you were to contact me."  Hame you signed your consent forms?  "Yes, I have."  You were supposed to contact me.  Do you have a date in mind?  "I'm scheduled to go out of town on September 26.  So, I can do it after October 1."  Dr. Amalia Hailey can do it on October 3 or 10.  "Schedule it for October 3, I want to go ahead and get this over with as soon as possible."  I'll get it scheduled.  Please go online and register.

## 2018-03-06 NOTE — Progress Notes (Signed)
DOS: 11-21-2017 Revisional repair achilles tendon Left; possible primary closure of wound left; possible application of wound vac Left heel  [place]

## 2018-03-21 ENCOUNTER — Other Ambulatory Visit: Payer: Self-pay | Admitting: Podiatry

## 2018-03-21 ENCOUNTER — Encounter: Payer: Self-pay | Admitting: Podiatry

## 2018-03-21 DIAGNOSIS — M2011 Hallux valgus (acquired), right foot: Secondary | ICD-10-CM

## 2018-03-21 DIAGNOSIS — M7751 Other enthesopathy of right foot: Secondary | ICD-10-CM

## 2018-03-21 DIAGNOSIS — M2041 Other hammer toe(s) (acquired), right foot: Secondary | ICD-10-CM

## 2018-03-21 MED ORDER — IBUPROFEN 600 MG PO TABS: 600.0000 mg | ORAL_TABLET | Freq: Three times a day (TID) | ORAL | 0 refills | Status: DC | PRN

## 2018-03-21 MED ORDER — OXYCODONE-ACETAMINOPHEN 5-325 MG PO TABS: 1.0000 | ORAL_TABLET | Freq: Four times a day (QID) | ORAL | 0 refills | Status: DC | PRN

## 2018-03-21 NOTE — Progress Notes (Signed)
Postop medication

## 2018-03-25 ENCOUNTER — Encounter: Payer: Self-pay | Admitting: Podiatry

## 2018-03-25 ENCOUNTER — Telehealth: Payer: Self-pay | Admitting: Podiatry

## 2018-03-25 ENCOUNTER — Ambulatory Visit (INDEPENDENT_AMBULATORY_CARE_PROVIDER_SITE_OTHER): Payer: Medicare HMO | Admitting: Podiatry

## 2018-03-25 VITALS — BP 131/81 | HR 82 | Temp 97.5°F

## 2018-03-25 DIAGNOSIS — T148XXA Other injury of unspecified body region, initial encounter: Secondary | ICD-10-CM

## 2018-03-25 DIAGNOSIS — Z09 Encounter for follow-up examination after completed treatment for conditions other than malignant neoplasm: Secondary | ICD-10-CM

## 2018-03-25 NOTE — Progress Notes (Signed)
This patient presents to the office stating that she had surgery last Thursday by Dr. Amalia Hailey  and her bandage has bled through.   She called the office this morning stating that she was having significant bleeding and she was scheduled this afternoon for an evaluation of her surgical site. She states she is having pain at the surgical site.  She says she is most concerned about this significant bleeding onto her bandage, right foot. He presents the office today wearing her cam walker  General Appearance  Alert, conversant and in no acute stress.  Vascular  Dorsalis pedis and posterior tibial  pulses are palpable  bilaterally.  Capillary return is within normal limits  bilaterally. Temperature is within normal limits  bilaterally.  Neurologic  Senn-Weinstein monofilament wire test within normal limits  bilaterally. Muscle power within normal limits bilaterally.  Nails Thick disfigured discolored nails with subungual debris  from hallux to fifth toes bilaterally. No evidence of bacterial infection or drainage bilaterally.  Orthopedic  Deferred due to bandage right foot.  Skin  Deferred to to bandage right foot.  Hematoma right foot  Post op visit.  ROV.  Examination of her bandage was performed and it was decided to remove the upper and outer layers of the bandage.  The layer of the bandage next to the skin was kept intact.  And she proceeded to re-bandage. The surgical site.  She is to keep her appointment with Dr. Amalia Hailey on Friday of this week for her first postoperative visit by the surgeon   Gardiner Barefoot DPM

## 2018-03-25 NOTE — Telephone Encounter (Signed)
I had surgery with Dr. Amalia Hailey on Thursday. When I woke up this morning it looks like there is quite a bit of fresh blood there and it looks like its bleeding and is coming through the bandage. I just wanted to let him know that. My phone number is [phone].

## 2018-03-25 NOTE — Telephone Encounter (Signed)
I spoke with patient, she reported that she has noticed a lot of bright red blood on bandage and is wet to touch started this morning.  She also states she is in more pain today and does not know why.  I have put her on Dr. Neomia Dear schedule today for eval and bandage change

## 2018-03-29 ENCOUNTER — Encounter: Payer: Self-pay | Admitting: Podiatry

## 2018-03-29 ENCOUNTER — Ambulatory Visit (INDEPENDENT_AMBULATORY_CARE_PROVIDER_SITE_OTHER): Payer: Medicare HMO | Admitting: Podiatry

## 2018-03-29 ENCOUNTER — Ambulatory Visit (INDEPENDENT_AMBULATORY_CARE_PROVIDER_SITE_OTHER): Payer: Medicare HMO

## 2018-03-29 VITALS — BP 118/81 | HR 103 | Resp 18

## 2018-03-29 DIAGNOSIS — M2041 Other hammer toe(s) (acquired), right foot: Secondary | ICD-10-CM

## 2018-03-29 DIAGNOSIS — M2011 Hallux valgus (acquired), right foot: Secondary | ICD-10-CM | POA: Diagnosis not present

## 2018-03-29 DIAGNOSIS — Z9889 Other specified postprocedural states: Secondary | ICD-10-CM

## 2018-03-29 MED ORDER — FLUCONAZOLE 150 MG PO TABS: 150.0000 mg | ORAL_TABLET | Freq: Once | ORAL | 0 refills | Status: AC

## 2018-03-29 MED ORDER — OXYCODONE-ACETAMINOPHEN 5-325 MG PO TABS: 1.0000 | ORAL_TABLET | Freq: Four times a day (QID) | ORAL | 0 refills | Status: DC | PRN

## 2018-04-02 NOTE — Progress Notes (Signed)
   Subjective:  Patient presents today status post right forefoot reconstruction. DOS: 03/21/18. She reports significant pain of the foot. She states her bandages are coming off and states she has been on the foot in moderate excess. Patient is here for further evaluation and treatment.    Past Medical History:  Diagnosis Date  . Breast cancer, right ([place]) 2011   Right Lumpectomy and Rad tx's.  Marland Kitchen Hypertension   . Hypothyroidism   . Sleep apnea       Objective/Physical Exam Neurovascular status intact.  Skin incisions appear to be well coapted with sutures and staples intact. No sign of infectious process noted. No dehiscence. No active bleeding noted. Moderate edema noted to the surgical extremity.  Radiographic Exam:  Orthopedic hardware and osteotomies sites appear to be stable with routine healing.  Assessment: 1. s/p right forefoot reconstruction. DOS: 03/21/18   Plan of Care:  1. Patient was evaluated. X-rays reviewed 2. Dressing changed. Keep clean, dry and intact for one week.  3. Continue weightbearing in CAM boot.  4. Refill prescription for Percocet 5/325 mg provided to patient.  5. Prescription for Diflucan for antibiotic/surgically induced yeast infection.  6. Return to clinic in one week.    Edrick Kins, DPM [facility]  Dr. Edrick Kins, [place]                                        [place], [place] [address]                Office [phone]  Fax [phone]

## 2018-04-05 ENCOUNTER — Ambulatory Visit (INDEPENDENT_AMBULATORY_CARE_PROVIDER_SITE_OTHER): Payer: Medicare HMO | Admitting: Podiatry

## 2018-04-05 ENCOUNTER — Encounter: Payer: Self-pay | Admitting: Podiatry

## 2018-04-05 DIAGNOSIS — Z9889 Other specified postprocedural states: Secondary | ICD-10-CM

## 2018-04-05 DIAGNOSIS — M2041 Other hammer toe(s) (acquired), right foot: Secondary | ICD-10-CM

## 2018-04-05 DIAGNOSIS — M2011 Hallux valgus (acquired), right foot: Secondary | ICD-10-CM

## 2018-04-05 MED ORDER — FLUCONAZOLE 150 MG PO TABS: 150.0000 mg | ORAL_TABLET | Freq: Once | ORAL | 0 refills | Status: AC

## 2018-04-05 MED ORDER — DOXYCYCLINE HYCLATE 100 MG PO TABS: 100.0000 mg | ORAL_TABLET | Freq: Two times a day (BID) | ORAL | 0 refills | Status: DC

## 2018-04-05 NOTE — Progress Notes (Signed)
   Subjective:  Patient presents today status post right forefoot reconstruction. DOS: 03/21/18. She states she is doing well overall. She notes continued moderate swelling. She is concerned the bandage has been slipping off. She has been using the CAM boot as directed without issue. Patient is here for further evaluation and treatment.    Past Medical History:  Diagnosis Date  . Breast cancer, right ([place]) 2011   Right Lumpectomy and Rad tx's.  Marland Kitchen Hypertension   . Hypothyroidism   . Sleep apnea       Objective/Physical Exam Neurovascular status intact. Heavy drainage noted from incision sites. Moderate edema noted to the surgical extremity.  Assessment: 1. s/p right forefoot reconstruction. DOS: 03/21/18   Plan of Care:  1. Patient was evaluated. 2. Dressing changed.   3. Continue weightbearing in CAM boot.  4. Prescription for Doxycycline provided to patient for prophylaxis.  5. Return to clinic in 10 days.     Edrick Kins, DPM [facility]  Dr. Edrick Kins, [place]                                        [place], [place] [address]                Office [phone]  Fax [phone]     .

## 2018-04-15 ENCOUNTER — Ambulatory Visit (INDEPENDENT_AMBULATORY_CARE_PROVIDER_SITE_OTHER): Payer: Medicare HMO | Admitting: Podiatry

## 2018-04-15 DIAGNOSIS — Z9889 Other specified postprocedural states: Secondary | ICD-10-CM

## 2018-04-15 DIAGNOSIS — M2041 Other hammer toe(s) (acquired), right foot: Secondary | ICD-10-CM

## 2018-04-15 DIAGNOSIS — M2011 Hallux valgus (acquired), right foot: Secondary | ICD-10-CM

## 2018-04-15 MED ORDER — GENTAMICIN SULFATE 0.1 % EX CREA: 1.0000 "application " | TOPICAL_CREAM | Freq: Two times a day (BID) | CUTANEOUS | 1 refills | Status: DC

## 2018-04-16 ENCOUNTER — Telehealth: Payer: Self-pay | Admitting: Podiatry

## 2018-04-16 ENCOUNTER — Ambulatory Visit: Payer: Medicare HMO

## 2018-04-16 DIAGNOSIS — M2011 Hallux valgus (acquired), right foot: Secondary | ICD-10-CM

## 2018-04-16 DIAGNOSIS — Z09 Encounter for follow-up examination after completed treatment for conditions other than malignant neoplasm: Secondary | ICD-10-CM

## 2018-04-16 NOTE — Telephone Encounter (Signed)
Dr. Amalia Hailey removed about 70 staples from my foot yesterday and I noticed there is still a staple in my foot. I'm going out of town tomorrow early morning. I wanted to know if the staple can stay in my foot until I see him again at the end of November or what I should do? I can be reached at [phone].

## 2018-04-16 NOTE — Progress Notes (Signed)
   Subjective:  Patient presents today status post right forefoot reconstruction. DOS: 03/21/18. She states she is doing well overall. She is concerned that the pins may be slipping out slightly. She has been wearing the CAM boot as directed. She denies modifying factors. Patient is here for further evaluation and treatment.    Past Medical History:  Diagnosis Date  . Breast cancer, right ([place]) 2011   Right Lumpectomy and Rad tx's.  Marland Kitchen Hypertension   . Hypothyroidism   . Sleep apnea       Objective/Physical Exam Neurovascular status intact. Heavy drainage noted from incision sites. Moderate edema noted to the surgical extremity.  Assessment: 1. s/p right forefoot reconstruction. DOS: 03/21/18   Plan of Care:  1. Patient was evaluated. 2. Percutaneous pins removed.  3. Staples removed.  4. Discontinue using CAM boot. Post op shoe dispensed.  5. Recommended wearing compression socks daily.  6. Prescription for gentamicin cream provided to patient to apply to incision sites daily.  7. Return to clinic in 4 weeks.     Edrick Kins, DPM [facility]  Dr. Edrick Kins, [place]                                        [place], [place] [address]                Office [phone]  Fax [phone]     .

## 2018-04-16 NOTE — Progress Notes (Signed)
Patient is here today status post right forefoot construction, D OS: 03/21/2018.  She states that she feels like there is a staple that is still left in her foot, and she would like it removed.  1 staple present at the medial incision, staples removed.  There was some minor dehiscence areas present distal part of second toe, and at the base of third and fourth toe, and the proximal hallux incision.  There was no erythema, no redness, small amount of serosanguineous drainage, but no other signs and symptoms of infection.  Discussed with patient how to redress her foot, and importance of changing bandage and applying gentamicin cream daily.  She is to continue weightbearing in cam boot, and continue taking her doxycycline.  Follow-up with any acute symptom changes.

## 2018-04-16 NOTE — Telephone Encounter (Signed)
I informed pt we would need to get her in to have the staple removed today and transferred pt to scheduler to get on the Nurses Schedule.

## 2018-05-14 ENCOUNTER — Ambulatory Visit (INDEPENDENT_AMBULATORY_CARE_PROVIDER_SITE_OTHER): Payer: Medicare HMO | Admitting: Podiatry

## 2018-05-14 ENCOUNTER — Encounter: Payer: Self-pay | Admitting: Podiatry

## 2018-05-14 DIAGNOSIS — M2041 Other hammer toe(s) (acquired), right foot: Secondary | ICD-10-CM

## 2018-05-14 DIAGNOSIS — Z09 Encounter for follow-up examination after completed treatment for conditions other than malignant neoplasm: Secondary | ICD-10-CM

## 2018-05-14 DIAGNOSIS — M2011 Hallux valgus (acquired), right foot: Secondary | ICD-10-CM

## 2018-05-15 NOTE — Progress Notes (Signed)
   Subjective:  Patient presents today status post right forefoot reconstruction. DOS: 03/21/18. She states the redness and swelling of the foot has improved. She reports some tenderness today. She states she accidentally bent her second toe while walking which caused significant pain but she states it is improving as well. There are no modifying factors noted. She has been using the post op shoe as directed. Patient is here for further evaluation and treatment.    Past Medical History:  Diagnosis Date  . Breast cancer, right ([place]) 2011   Right Lumpectomy and Rad tx's.  Marland Kitchen Hypertension   . Hypothyroidism   . Sleep apnea       Objective/Physical Exam Neurovascular status intact. Heavy drainage noted from incision sites has resolved. Moderate edema noted to the surgical extremity.  Assessment: 1. s/p right forefoot reconstruction. DOS: 03/21/18   Plan of Care:  1. Patient was evaluated. 2. Compression anklet dispensed.  3. Transition out of post op shoe into good sneakers.  4. Return to clinic in 4 weeks.   Going to [place] on 06/13/18.      Edrick Kins, DPM [facility]  Dr. Edrick Kins, [place] [address]                                        [place], [place] [address]                Office [phone]  Fax [phone]     .

## 2018-05-31 NOTE — Progress Notes (Signed)
DOS  03/21/2018  Bunionectomy with osteotomy of right foot. Hammertoe repair with capsulotomy second, third, and fourth digits right foot.

## 2018-06-07 ENCOUNTER — Ambulatory Visit (INDEPENDENT_AMBULATORY_CARE_PROVIDER_SITE_OTHER): Payer: Medicare HMO | Admitting: Podiatry

## 2018-06-07 ENCOUNTER — Encounter: Payer: Medicare HMO | Admitting: Podiatry

## 2018-06-07 ENCOUNTER — Encounter: Payer: Self-pay | Admitting: Podiatry

## 2018-06-07 ENCOUNTER — Ambulatory Visit (INDEPENDENT_AMBULATORY_CARE_PROVIDER_SITE_OTHER): Payer: Medicare HMO

## 2018-06-07 DIAGNOSIS — M84374A Stress fracture, right foot, initial encounter for fracture: Secondary | ICD-10-CM | POA: Diagnosis not present

## 2018-06-07 DIAGNOSIS — L600 Ingrowing nail: Secondary | ICD-10-CM | POA: Diagnosis not present

## 2018-06-07 DIAGNOSIS — Z9889 Other specified postprocedural states: Secondary | ICD-10-CM

## 2018-06-07 DIAGNOSIS — M2011 Hallux valgus (acquired), right foot: Secondary | ICD-10-CM

## 2018-06-15 NOTE — Progress Notes (Signed)
   Subjective:  Patient presents today status post right forefoot reconstruction. DOS: 03/21/18.  Patient states that she still continues to experience moderate amount of swelling.  The foot feels very tight.  Otherwise she denies any significant pain or tenderness to the surgical forefoot.  She says that there is some intermittent pain and tenderness to the dorsal aspect of the forefoot especially during periods of long standing. Patient has a new complaint today regarding an ingrown toenail to the right hallux.  This began about 3 weeks ago and is very tender to touch.  She has dealt with ingrown toenails off and on over the past several years.  She presents for further treatment evaluation    Past Medical History:  Diagnosis Date  . Breast cancer, right ([place]) 2011   Right Lumpectomy and Rad tx's.  Marland Kitchen Hypertension   . Hypothyroidism   . Sleep apnea       Objective/Physical Exam Neurovascular status intact. Heavy drainage noted from incision sites has resolved. Moderate edema noted to the surgical extremity with some moderate pain on palpation overlying the second metatarsal of the surgical forefoot.  Evidence of an ingrowing nail to the medial border right hallux with pain on palpation along that medial border of the toe.  Negative for any sign of infectious process or abscess.  Radiographic exam Orthopedic hardware intact.  Joint spaces in good alignment and are preserved.  There does appear to be a new finding of a stress fracture to the second metatarsal consistent with the patient's pain and chronic edema postoperatively.  There also appears to be a remaining standstill skin staple along the incision site of the forefoot.  Assessment: 1. s/p right forefoot reconstruction. DOS: 03/21/18 2.  Ingrown toenail medial border right hallux 3.  Stress fracture second metatarsal right foot with evidence of healing and callus formation   Plan of Care:  1. Patient was evaluated.  X-rays  reviewed today 2.  Today I discussed management of chronic ingrown toenails.  Patient would like to have the ingrown toenail permanently removed.  Toe was prepped in aseptic manner and 3 cc of 2% lidocaine plain were utilized in a hallux block fashion.  Medial border of the right hallux nail plate was removed with 3 x [id number] applications of phenol.  Dry sterile dressing was applied.  Postprocedural care instructions were also provided for the patient. 3.  1 cc of 1% lidocaine plain was infiltrated around the standstill skin staple site.  The staples completely embedded within the skin.  Staples identified and removed with antibiotic ointment a Band-Aid applied. 4.  As pertaining to the stress fracture, I do recommend returning to a surgical shoe with minimal weightbearing.  I explained the patient that this is likely the reason that she is getting the chronic edema with the pain and tenderness intermittently.  Recommend reducing activity and resuming the postoperative shoe. 5.  Return to clinic in 8 weeks for follow-up x-rays  Edrick Kins, DPM [facility]  Dr. Edrick Kins, [place]                                        [place], [place] [address]                Office [phone]  Fax [phone]     .

## 2018-06-27 ENCOUNTER — Other Ambulatory Visit: Payer: Self-pay

## 2018-06-27 MED ORDER — MELOXICAM 15 MG PO TABS: 15.0000 mg | ORAL_TABLET | Freq: Every day | ORAL | 1 refills | Status: DC

## 2018-06-27 NOTE — Telephone Encounter (Signed)
Pharmacy refill request for Meloxicam 15mg  90 day supply.  Per Dr. Amalia Hailey, ok to refill.   New script has been sent to [facility]

## 2018-07-30 ENCOUNTER — Ambulatory Visit (INDEPENDENT_AMBULATORY_CARE_PROVIDER_SITE_OTHER): Payer: Medicare Other

## 2018-07-30 ENCOUNTER — Ambulatory Visit (INDEPENDENT_AMBULATORY_CARE_PROVIDER_SITE_OTHER): Payer: Medicare Other | Admitting: Podiatry

## 2018-07-30 DIAGNOSIS — M2011 Hallux valgus (acquired), right foot: Secondary | ICD-10-CM

## 2018-07-30 DIAGNOSIS — M84374D Stress fracture, right foot, subsequent encounter for fracture with routine healing: Secondary | ICD-10-CM | POA: Diagnosis not present

## 2018-07-30 DIAGNOSIS — Z9889 Other specified postprocedural states: Secondary | ICD-10-CM

## 2018-08-02 ENCOUNTER — Encounter: Payer: Self-pay | Admitting: *Deleted

## 2018-08-04 NOTE — Progress Notes (Signed)
   Subjective:  Patient presents today status post right forefoot reconstruction. DOS: 03/21/18. She states she is improving. She reports a new corn forming on the right 4th toe that appeared 3-4 days ago. Walking and wearing shoes increases the pain. She has not done anything for treatment. Patient is here for further evaluation and treatment.    Past Medical History:  Diagnosis Date  . Acquired trigger finger of both middle fingers   . Arthritis   . Breast cancer, right ([place]) 2011   Right Lumpectomy and Rad tx's.  . Depression   . GERD (gastroesophageal reflux disease)   . Hyperlipidemia   . Hypertension   . Hypothyroidism   . Narcolepsy and cataplexy   . Neuropathy   . Osteoporosis, post-menopausal   . Sleep apnea       Objective/Physical Exam Neurovascular status intact. Heavy drainage noted from incision sites has resolved. Moderate edema noted to the surgical extremity with some moderate pain on palpation overlying the second metatarsal of the surgical forefoot.  Radiographic exam Orthopedic hardware intact.  Joint spaces in good alignment and are preserved. Stress fracture to the second metatarsal with routine callus formation and healing.    Assessment: 1. s/p right forefoot reconstruction. DOS: 03/21/18 2. Stress fracture second metatarsal right foot with evidence of healing and callus formation   Plan of Care:  1. Patient was evaluated.  X-rays reviewed today 2. Resume wearing good shoe gear.  3. Orders for physical therapy placed for right forefoot stiffness secondary to surgery.  4. Return to clinic in 6 weeks.   Going back to [place] March 1st.   Edrick Kins, DPM [facility]  Dr. Edrick Kins, [address]                                        [place], [place] [address]                Office [phone]  Fax [phone]     .

## 2018-08-05 ENCOUNTER — Other Ambulatory Visit: Payer: Self-pay

## 2018-08-05 ENCOUNTER — Encounter: Payer: Self-pay | Admitting: Student

## 2018-08-05 ENCOUNTER — Ambulatory Visit: Payer: Medicare Other | Admitting: Certified Registered"

## 2018-08-05 ENCOUNTER — Ambulatory Visit
Admission: RE | Admit: 2018-08-05 | Discharge: 2018-08-05 | Disposition: A | Discharge status: Home / Self Care | Payer: Medicare Other | Attending: Gastroenterology | Admitting: Gastroenterology

## 2018-08-05 ENCOUNTER — Encounter
Admission: RE | Disposition: A | Discharge status: Home / Self Care | Payer: Self-pay | Source: Home / Self Care | Attending: Gastroenterology

## 2018-08-05 DIAGNOSIS — Z79899 Other long term (current) drug therapy: Secondary | ICD-10-CM | POA: Insufficient documentation

## 2018-08-05 DIAGNOSIS — I10 Essential (primary) hypertension: Secondary | ICD-10-CM | POA: Insufficient documentation

## 2018-08-05 DIAGNOSIS — E785 Hyperlipidemia, unspecified: Secondary | ICD-10-CM | POA: Insufficient documentation

## 2018-08-05 DIAGNOSIS — F329 Major depressive disorder, single episode, unspecified: Secondary | ICD-10-CM | POA: Diagnosis not present

## 2018-08-05 DIAGNOSIS — M1990 Unspecified osteoarthritis, unspecified site: Secondary | ICD-10-CM | POA: Insufficient documentation

## 2018-08-05 DIAGNOSIS — M810 Age-related osteoporosis without current pathological fracture: Secondary | ICD-10-CM | POA: Insufficient documentation

## 2018-08-05 DIAGNOSIS — Z79890 Hormone replacement therapy (postmenopausal): Secondary | ICD-10-CM | POA: Diagnosis not present

## 2018-08-05 DIAGNOSIS — K219 Gastro-esophageal reflux disease without esophagitis: Secondary | ICD-10-CM | POA: Diagnosis not present

## 2018-08-05 DIAGNOSIS — K580 Irritable bowel syndrome with diarrhea: Secondary | ICD-10-CM | POA: Diagnosis not present

## 2018-08-05 DIAGNOSIS — K621 Rectal polyp: Secondary | ICD-10-CM | POA: Diagnosis not present

## 2018-08-05 DIAGNOSIS — Z1211 Encounter for screening for malignant neoplasm of colon: Secondary | ICD-10-CM | POA: Diagnosis present

## 2018-08-05 DIAGNOSIS — Z800 Family history of malignant neoplasm of digestive organs: Secondary | ICD-10-CM | POA: Diagnosis not present

## 2018-08-05 DIAGNOSIS — Z7901 Long term (current) use of anticoagulants: Secondary | ICD-10-CM | POA: Insufficient documentation

## 2018-08-05 DIAGNOSIS — Z853 Personal history of malignant neoplasm of breast: Secondary | ICD-10-CM | POA: Insufficient documentation

## 2018-08-05 DIAGNOSIS — K5730 Diverticulosis of large intestine without perforation or abscess without bleeding: Secondary | ICD-10-CM | POA: Diagnosis not present

## 2018-08-05 DIAGNOSIS — G2581 Restless legs syndrome: Secondary | ICD-10-CM | POA: Insufficient documentation

## 2018-08-05 DIAGNOSIS — G4730 Sleep apnea, unspecified: Secondary | ICD-10-CM | POA: Diagnosis not present

## 2018-08-05 DIAGNOSIS — G629 Polyneuropathy, unspecified: Secondary | ICD-10-CM | POA: Diagnosis not present

## 2018-08-05 DIAGNOSIS — Z791 Long term (current) use of non-steroidal anti-inflammatories (NSAID): Secondary | ICD-10-CM | POA: Insufficient documentation

## 2018-08-05 DIAGNOSIS — E039 Hypothyroidism, unspecified: Secondary | ICD-10-CM | POA: Insufficient documentation

## 2018-08-05 HISTORY — DX: Restless legs syndrome: G25.81

## 2018-08-05 HISTORY — DX: Narcolepsy with cataplexy: G47.411

## 2018-08-05 HISTORY — DX: Unspecified osteoarthritis, unspecified site: M19.90

## 2018-08-05 HISTORY — DX: Trigger finger, right middle finger: M65.331

## 2018-08-05 HISTORY — DX: Gastro-esophageal reflux disease without esophagitis: K21.9

## 2018-08-05 HISTORY — DX: Hyperlipidemia, unspecified: E78.5

## 2018-08-05 HISTORY — DX: Polyneuropathy, unspecified: G62.9

## 2018-08-05 HISTORY — DX: Major depressive disorder, single episode, unspecified: F32.9

## 2018-08-05 HISTORY — DX: Depression, unspecified: F32.A

## 2018-08-05 HISTORY — DX: Age-related osteoporosis without current pathological fracture: M81.0

## 2018-08-05 HISTORY — PX: COLONOSCOPY WITH PROPOFOL: SHX5780

## 2018-08-05 HISTORY — DX: Trigger finger, left middle finger: M65.332

## 2018-08-05 SURGERY — COLONOSCOPY WITH PROPOFOL
Anesthesia: General

## 2018-08-05 MED ORDER — GLYCOPYRROLATE 0.2 MG/ML IJ SOLN
INTRAMUSCULAR | Status: AC
  Filled 2018-08-05: qty 1

## 2018-08-05 MED ORDER — MIDAZOLAM HCL 2 MG/2ML IJ SOLN
INTRAMUSCULAR | Status: DC | PRN
  Administered 2018-08-05 (×2): 0.5 mg via INTRAVENOUS

## 2018-08-05 MED ORDER — MIDAZOLAM HCL 2 MG/2ML IJ SOLN
INTRAMUSCULAR | Status: AC
  Filled 2018-08-05: qty 2

## 2018-08-05 MED ORDER — PROPOFOL 500 MG/50ML IV EMUL
INTRAVENOUS | Status: DC | PRN
  Administered 2018-08-05: 100 ug/kg/min via INTRAVENOUS

## 2018-08-05 MED ORDER — PROPOFOL 10 MG/ML IV BOLUS
INTRAVENOUS | Status: DC | PRN
  Administered 2018-08-05 (×2): 30 mg via INTRAVENOUS
  Administered 2018-08-05: 50 mg via INTRAVENOUS
  Administered 2018-08-05: 30 mg via INTRAVENOUS

## 2018-08-05 MED ORDER — PROPOFOL 500 MG/50ML IV EMUL
INTRAVENOUS | Status: AC
  Filled 2018-08-05: qty 50

## 2018-08-05 MED ORDER — PROPOFOL 10 MG/ML IV BOLUS
INTRAVENOUS | Status: AC
  Filled 2018-08-05: qty 20

## 2018-08-05 MED ORDER — SODIUM CHLORIDE 0.9 % IV SOLN
INTRAVENOUS | Status: DC
  Administered 2018-08-05: 13:00:00 via INTRAVENOUS

## 2018-08-05 MED ORDER — FENTANYL CITRATE (PF) 100 MCG/2ML IJ SOLN
INTRAMUSCULAR | Status: DC | PRN
  Administered 2018-08-05 (×4): 25 ug via INTRAVENOUS

## 2018-08-05 MED ORDER — LIDOCAINE HCL (CARDIAC) PF 100 MG/5ML IV SOSY
PREFILLED_SYRINGE | INTRAVENOUS | Status: DC | PRN
  Administered 2018-08-05: 60 mg via INTRATRACHEAL

## 2018-08-05 MED ORDER — GLYCOPYRROLATE 0.2 MG/ML IJ SOLN
INTRAMUSCULAR | Status: DC | PRN
  Administered 2018-08-05: 0.2 mg via INTRAVENOUS

## 2018-08-05 MED ORDER — FENTANYL CITRATE (PF) 100 MCG/2ML IJ SOLN
INTRAMUSCULAR | Status: AC
  Filled 2018-08-05: qty 2

## 2018-08-05 NOTE — Anesthesia Post-op Follow-up Note (Signed)
Anesthesia QCDR form completed.        

## 2018-08-05 NOTE — Anesthesia Preprocedure Evaluation (Signed)
Anesthesia Evaluation  Patient identified by MRN, date of birth, ID band Patient awake    Reviewed: Allergy & Precautions, H&P , NPO status , Patient's Chart, lab work & pertinent test results  History of Anesthesia Complications Negative for: history of anesthetic complications  Airway Mallampati: III  TM Distance: <3 FB Neck ROM: limited    Dental  (+) Chipped   Pulmonary neg pulmonary ROS, neg shortness of breath, sleep apnea ,           Cardiovascular Exercise Tolerance: Good hypertension, (-) angina(-) Past MI and (-) DOE      Neuro/Psych  Headaches, PSYCHIATRIC DISORDERS    GI/Hepatic Neg liver ROS, GERD  Medicated and Controlled,  Endo/Other  Hypothyroidism   Renal/GU negative Renal ROS  negative genitourinary   Musculoskeletal  (+) Arthritis ,   Abdominal   Peds  Hematology negative hematology ROS (+)   Anesthesia Other Findings Past Medical History: No date: Acquired trigger finger of both middle fingers No date: Arthritis 2011: Breast cancer, right ([place])     Comment:  Right Lumpectomy and Rad tx's. No date: Depression No date: GERD (gastroesophageal reflux disease) No date: Hyperlipidemia No date: Hypertension No date: Hypothyroidism No date: Narcolepsy and cataplexy No date: Neuropathy No date: Osteoporosis, post-menopausal No date: RLS (restless legs syndrome) No date: Sleep apnea  Past Surgical History: No date: ABDOMINAL HYSTERECTOMY No date: APPENDECTOMY No date: BREAST SURGERY 11/25/2014: CARDIAC CATHETERIZATION; N/A     Comment:  Procedure: Left Heart Cath;  Surgeon: Teodoro Spray,               MD;  Location: [facility];  Service:               Cardiovascular;  Laterality: N/A; No date: CHOLECYSTECTOMY No date: MASTECTOMY; Right     Comment:  partial/lumpectomy  BMI    Body Mass Index:  33.97 kg/m      Reproductive/Obstetrics negative OB ROS                              Anesthesia Physical Anesthesia Plan  ASA: III  Anesthesia Plan: General   Post-op Pain Management:    Induction: Intravenous  PONV Risk Score and Plan: Propofol infusion and TIVA  Airway Management Planned: Natural Airway and Nasal Cannula  Additional Equipment:   Intra-op Plan:   Post-operative Plan:   Informed Consent: I have reviewed the patients History and Physical, chart, labs and discussed the procedure including the risks, benefits and alternatives for the proposed anesthesia with the patient or authorized representative who has indicated his/her understanding and acceptance.     Dental Advisory Given  Plan Discussed with: Anesthesiologist, CRNA and Surgeon  Anesthesia Plan Comments: (Patient consented for risks of anesthesia including but not limited to:  - adverse reactions to medications - risk of intubation if required - damage to teeth, lips or other oral mucosa - sore throat or hoarseness - Damage to heart, brain, lungs or loss of life  Patient voiced understanding.)        Anesthesia Quick Evaluation

## 2018-08-05 NOTE — H&P (Signed)
Outpatient short stay form Pre-procedure 08/05/2018 12:57 PM Lollie Sails MD  Primary Physician: Dr. Gershon Crane  Reason for visit: Colonoscopy  History of present illness:    Patient is a 68 year old female presenting today for colonoscopy in regards her family history of colon cancer primary relative, mother.  Patient also has a history of IBS D and has problems with intermittent diarrhea.  She has not connected a particular food to it though there is possibility of lactose intolerance from some of the questions I asked her.   No current facility-administered medications for this encounter.   Facility-Administered Medications Prior to Admission  Medication Dose Route Frequency Provider Last Rate Last Dose  . betamethasone acetate-betamethasone sodium phosphate (CELESTONE) injection 3 mg  3 mg Intramuscular Once Daylene Katayama M, DPM      . betamethasone acetate-betamethasone sodium phosphate (CELESTONE) injection 3 mg  3 mg Intramuscular Once Edrick Kins, DPM       Medications Prior to Admission  Medication Sig Dispense Refill Last Dose  . albuterol (PROVENTIL HFA;VENTOLIN HFA) 108 (90 Base) MCG/ACT inhaler Inhale 2 puffs into the lungs every 6 (six) hours as needed for wheezing or shortness of breath.   08/05/2018 at 1000  . gabapentin (NEURONTIN) 800 MG tablet Take 800 mg by mouth 2 (two) times daily.  2 08/04/2018 at 2230  . levothyroxine (SYNTHROID, LEVOTHROID) 75 MCG tablet    08/05/2018 at 0500  . lisinopril (PRINIVIL,ZESTRIL) 20 MG tablet Take 20 mg by mouth daily.  3 08/05/2018 at 0500  . omeprazole (PRILOSEC OTC) 20 MG tablet Take by mouth.   08/04/2018 at Unknown time  . pramipexole (MIRAPEX) 1 MG tablet Take 1 mg by mouth 3 (three) times daily.   08/05/2018 at 1100  . Vitamin D, Ergocalciferol, (DRISDOL) 50000 units CAPS capsule Take by mouth.   08/04/2018 at Unknown time  . apixaban (ELIQUIS) 5 MG TABS tablet Take 5 mg by mouth 2 (two) times daily.    Not Taking at  Unknown time  . ARIPiprazole (ABILIFY) 5 MG tablet Take 5 mg by mouth daily.   Not Taking at Unknown time  . atorvastatin (LIPITOR) 20 MG tablet Take 20 mg by mouth daily.  11 Not Taking at Unknown time  . B Complex Vitamins (VITAMIN B-COMPLEX) TABS Take by mouth.   Not Taking at Unknown time  . buPROPion (WELLBUTRIN XL) 150 MG 24 hr tablet Take 150 mg by mouth daily.  11 Not Taking at Unknown time  . ciprofloxacin (CIPRO) 500 MG tablet Take 1 tablet (500 mg total) by mouth 2 (two) times daily. (Patient not taking: Reported on 08/05/2018) 20 tablet 0 Not Taking at Unknown time  . collagenase (SANTYL) ointment Apply 1 application topically daily. 30 g 1 Taking  . Desvenlafaxine Succinate ER 25 MG TB24 TAKE 1 TABLET BY MOUTH DAILY FOR 14 DAYS THEN INCREASE TO 2 TABLETS BY MOUTH DAILY.  1 Not Taking at Unknown time  . diclofenac (VOLTAREN) 75 MG EC tablet Take 1 tablet (75 mg total) by mouth 2 (two) times daily. (Patient not taking: Reported on 08/05/2018) 60 tablet 0 Not Taking at Unknown time  . doxycycline (VIBRA-TABS) 100 MG tablet Take 1 tablet (100 mg total) by mouth 2 (two) times daily. (Patient not taking: Reported on 08/05/2018) 20 tablet 0 Not Taking at Unknown time  . DULoxetine (CYMBALTA) 60 MG capsule Take 1 capsule (60 mg total) by mouth daily. (Patient not taking: Reported on 08/05/2018) 30 capsule 4 Not  Taking at Unknown time  . gabapentin (NEURONTIN) 800 MG tablet Take 1 tablet (800 mg total) by mouth 2 (two) times daily. 60 tablet 4 Taking  . gentamicin cream (GARAMYCIN) 0.1 % Apply 1 application topically 2 (two) times daily. 30 g 1 Taking  . HYDROcodone-acetaminophen (NORCO/VICODIN) 5-325 MG tablet Take 1 tablet by mouth every 6 (six) hours as needed for moderate pain. (Patient not taking: Reported on 08/05/2018) 30 tablet 0 Completed Course at Unknown time  . hydrOXYzine (ATARAX/VISTARIL) 50 MG tablet TAKE 2 TO 3 TABLETS BY MOUTH AT BEDTIME  1 Not Taking at Unknown time  . hyoscyamine  (LEVBID) 0.375 MG 12 hr tablet Take by mouth.   Not Taking  . ibuprofen (ADVIL,MOTRIN) 600 MG tablet Take 1 tablet (600 mg total) by mouth every 8 (eight) hours as needed. (Patient not taking: Reported on 08/05/2018) 60 tablet 0 Not Taking at Unknown time  . levothyroxine (SYNTHROID, LEVOTHROID) 75 MCG tablet TAKE 1 TABLET EVERY DAY ON AN EMPTY STOMACH WITH WATER 30-60 MINUTES BEFORE BREAKFAST   Taking  . meloxicam (MOBIC) 15 MG tablet Take 15 mg by mouth daily.  0 Not Taking at Unknown time  . meloxicam (MOBIC) 15 MG tablet Take 1 tablet (15 mg total) by mouth daily. (Patient not taking: Reported on 08/05/2018) 90 tablet 1 Not Taking at Unknown time  . mupirocin ointment (BACTROBAN) 2 % Place 1 application into the nose 2 (two) times daily. 22 g 0 Taking  . NONFORMULARY OR COMPOUNDED ITEM See pharmacy note 120 each 2 Taking  . Omega-3 Fatty Acids (FISH OIL) 1000 MG CAPS Take 1,000 mg by mouth daily.   Not Taking at Unknown time  . oxyCODONE-acetaminophen (PERCOCET) 5-325 MG tablet Take 1 tablet by mouth every 6 (six) hours as needed for severe pain. (Patient not taking: Reported on 08/05/2018) 30 tablet 0 Not Taking at Unknown time  . Pramipexole Dihydrochloride 1.5 MG TB24 Take by mouth.   Taking  . pregabalin (LYRICA) 100 MG capsule Take by mouth.   Not Taking at Unknown time  . QUEtiapine (SEROQUEL) 100 MG tablet Take by mouth.   Not Taking at Unknown time  . spironolactone (ALDACTONE) 50 MG tablet Take 50 mg by mouth daily.  98 Not Taking at Unknown time  . tiZANidine (ZANAFLEX) 2 MG tablet Take 1 tablet (2 mg total) by mouth 2 (two) times daily as needed for muscle spasms. (Patient not taking: Reported on 08/05/2018) 60 tablet 4 Not Taking at Unknown time  . traMADol (ULTRAM) 50 MG tablet Take 1 tablet (50 mg total) by mouth every 8 (eight) hours as needed. (Patient not taking: Reported on 08/05/2018) 30 tablet 0 Not Taking at Unknown time     No Known Allergies   Past Medical History:   Diagnosis Date  . Acquired trigger finger of both middle fingers   . Arthritis   . Breast cancer, right ([place]) 2011   Right Lumpectomy and Rad tx's.  . Depression   . GERD (gastroesophageal reflux disease)   . Hyperlipidemia   . Hypertension   . Hypothyroidism   . Narcolepsy and cataplexy   . Neuropathy   . Osteoporosis, post-menopausal   . RLS (restless legs syndrome)   . Sleep apnea     Review of systems:      Physical Exam    Heart and lungs: Rhythm without rub or gallop, lungs are bilaterally clear.    HEENT: Normocephalic atraumatic eyes are anicteric    Other:  Pertinant exam for procedure: Soft nontender nondistended bowel sounds positive normoactive    Planned proceedures: Colonoscopy and indicated procedures. I have discussed the risks benefits and complications of procedures to include not limited to bleeding, infection, perforation and the risk of sedation and the patient wishes to proceed.    Lollie Sails, MD Gastroenterology 08/05/2018  12:57 PM

## 2018-08-05 NOTE — Transfer of Care (Signed)
Immediate Anesthesia Transfer of Care Note  Patient: Karen [name]  Procedure(s) Performed: COLONOSCOPY WITH PROPOFOL (N/A )  Patient Location: Endoscopy Unit  Anesthesia Type:General  Level of Consciousness: drowsy  Airway & Oxygen Therapy: Patient Spontanous Breathing and Patient connected to nasal cannula oxygen  Post-op Assessment: Report given to RN and Post -op Vital signs reviewed and stable  Post vital signs: stable  Last Vitals:  Vitals Value Taken Time  BP 132/70 08/05/2018  2:09 PM  Temp    Pulse 95 08/05/2018  2:10 PM  Resp 20 08/05/2018  2:10 PM  SpO2 100 % 08/05/2018  2:10 PM  Vitals shown include unvalidated device data.  Last Pain:  Vitals:   08/05/18 1409  TempSrc: (P) Tympanic  PainSc:          Complications: No apparent anesthesia complications

## 2018-08-05 NOTE — Op Note (Addendum)
[facility] Gastroenterology Patient Name: Karen [name] Procedure Date: 08/05/2018 12:52 PM MRN: [id number] Account #: [id number] Date of Birth: June 06, 1951 Admit Type: Outpatient Age: 68 Room: [facility] ENDO ROOM 1 Gender: Female Note Status: Finalized Procedure:            Colonoscopy Indications:          Family history of colon cancer in a first-degree                        relative Providers:            Lollie Sails, MD Medicines:            Monitored Anesthesia Care Complications:        No immediate complications. Procedure:            Pre-Anesthesia Assessment:                       - ASA Grade Assessment: III - A patient with severe                        systemic disease.                       After obtaining informed consent, the colonoscope was                        passed under direct vision. Throughout the procedure,                        the patient's blood pressure, pulse, and oxygen                        saturations were monitored continuously. The                        Colonoscope was introduced through the anus and                        advanced to the the cecum, identified by appendiceal                        orifice and ileocecal valve. The colonoscopy was                        performed with moderate difficulty due to significant                        looping. Successful completion of the procedure was                        aided by changing the patient to a supine position,                        changing the patient to a prone position and using                        manual pressure. The patient tolerated the procedure                        well. The quality of the bowel preparation was fair.  Findings:      Multiple medium-mouthed diverticula were found in the sigmoid colon,       descending colon and distal descending colon.      A 2 mm polyp was found in the rectum. The polyp was sessile. The polyp       was removed  with a cold biopsy forceps. Resection and retrieval were       complete.      Biopsies for histology were taken with a cold forceps from the right       colon and left colon for evaluation of microscopic colitis.      The retroflexed view of the distal rectum and anal verge was normal and       showed no anal or rectal abnormalities.      Skin tags were found on perianal exam.      The exam was otherwise without abnormality. Impression:           - Preparation of the colon was fair.                       - Diverticulosis in the sigmoid colon, in the                        descending colon and in the distal descending colon.                       - One 2 mm polyp in the rectum, removed with a cold                        biopsy forceps. Resected and retrieved.                       - The distal rectum and anal verge are normal on                        retroflexion view.                       - Perianal skin tags found on perianal exam.                       - The examination was otherwise normal.                       - Biopsies were taken with a cold forceps from the                        right colon and left colon for evaluation of                        microscopic colitis. Recommendation:       - Discharge patient to home. Procedure Code(s):    --- Professional ---                       [phone], Colonoscopy, flexible; with biopsy, single or                        multiple Diagnosis Code(s):    --- Professional ---  K62.1, Rectal polyp                       K64.4, Residual hemorrhoidal skin tags                       Z80.0, Family history of malignant neoplasm of                        digestive organs                       K57.30, Diverticulosis of large intestine without                        perforation or abscess without bleeding CPT copyright 2018 American Medical Association. All rights reserved. The codes documented in this report are preliminary and upon  coder review may  be revised to meet current compliance requirements. Lollie Sails, MD 08/05/2018 2:05:39 PM This report has been signed electronically. Number of Addenda: 0 Note Initiated On: 08/05/2018 12:52 PM Scope Withdrawal Time: 0 hours 17 minutes 3 seconds  Total Procedure Duration: 0 hours 38 minutes 45 seconds       [facility]

## 2018-08-06 ENCOUNTER — Encounter: Payer: Self-pay | Admitting: Gastroenterology

## 2018-08-06 NOTE — Anesthesia Postprocedure Evaluation (Signed)
Anesthesia Post Note  Patient: Karen [name]  Procedure(s) Performed: COLONOSCOPY WITH PROPOFOL (N/A )  Patient location during evaluation: Endoscopy Anesthesia Type: General Level of consciousness: awake and alert Pain management: pain level controlled Vital Signs Assessment: post-procedure vital signs reviewed and stable Respiratory status: spontaneous breathing, nonlabored ventilation, respiratory function stable and patient connected to nasal cannula oxygen Cardiovascular status: blood pressure returned to baseline and stable Postop Assessment: no apparent nausea or vomiting Anesthetic complications: no     Last Vitals:  Vitals:   08/05/18 1419 08/05/18 1429  BP: (!) 142/85 (!) 157/129  Pulse: 98 96  Resp: (!) 21 11  Temp:    SpO2: 100% 100%    Last Pain:  Vitals:   08/06/18 0744  TempSrc:   PainSc: 0-No pain                 Precious Haws Piscitello

## 2018-08-07 LAB — SURGICAL PATHOLOGY

## 2018-09-10 ENCOUNTER — Ambulatory Visit (INDEPENDENT_AMBULATORY_CARE_PROVIDER_SITE_OTHER): Payer: Medicare Other | Admitting: Podiatry

## 2018-09-10 ENCOUNTER — Encounter: Payer: Self-pay | Admitting: Podiatry

## 2018-09-10 ENCOUNTER — Other Ambulatory Visit: Payer: Self-pay

## 2018-09-10 DIAGNOSIS — L600 Ingrowing nail: Secondary | ICD-10-CM | POA: Diagnosis not present

## 2018-09-10 DIAGNOSIS — L989 Disorder of the skin and subcutaneous tissue, unspecified: Secondary | ICD-10-CM

## 2018-09-10 MED ORDER — GENTAMICIN SULFATE 0.1 % EX CREA: 1.0000 "application " | TOPICAL_CREAM | Freq: Two times a day (BID) | CUTANEOUS | 1 refills | Status: DC

## 2018-09-10 NOTE — Patient Instructions (Signed)

## 2018-09-11 NOTE — Progress Notes (Signed)
   Subjective: Patient presents today for evaluation of pain to the lateral border of the right third toe that began three weeks ago. Patient is concerned for possible ingrown nail. She also notes a painful callus lesion noted to the lateral fourth toe that has been present for the past few months. She reports the pain from both of these area increases with walking. She has not done anything for treatment. Patient presents today for further treatment and evaluation.  Past Medical History:  Diagnosis Date  . Acquired trigger finger of both middle fingers   . Arthritis   . Breast cancer, right ([place]) 2011   Right Lumpectomy and Rad tx's.  . Depression   . GERD (gastroesophageal reflux disease)   . Hyperlipidemia   . Hypertension   . Hypothyroidism   . Narcolepsy and cataplexy   . Neuropathy   . Osteoporosis, post-menopausal   . RLS (restless legs syndrome)   . Sleep apnea     Objective:  General: Well developed, nourished, in no acute distress, alert and oriented x3   Dermatology: Skin is warm, dry and supple bilateral. Lateral border of the right third toe appears to be erythematous with evidence of an ingrowing nail. Pain on palpation noted to the border of the nail fold. Callus noted to the 4th interdigital webspace. The remaining nails appear unremarkable at this time. There are no open sores, lesions.  Vascular: Dorsalis Pedis artery and Posterior Tibial artery pedal pulses palpable. No lower extremity edema noted.   Neruologic: Grossly intact via light touch bilateral.  Musculoskeletal: Muscular strength within normal limits in all groups bilateral. Normal range of motion noted to all pedal and ankle joints.   Assesement: #1 Paronychia with ingrowing nail right lateral third toe #2 Incurvated nail #3 Callus right fourth toe  Plan of Care:  1. Patient evaluated.  2. Discussed treatment alternatives and plan of care. Explained nail avulsion procedure and post procedure course  to patient. 3. Patient opted for permanent partial nail avulsion of the lateral border of the right third toe.  4. Prior to procedure, local anesthesia infiltration utilized using 3 ml of a 50:50 mixture of 2% plain lidocaine and 0.5% plain marcaine in a normal hallux block fashion and a betadine prep performed.  5. Partial permanent nail avulsion with chemical matrixectomy performed using [id number] applications of phenol followed by alcohol flush.  6. Light dressing applied. 7. Excisional debridement of keratotic lesion using a chisel blade was performed without incident. Light dressing applied.  8. Prescription for Gentamicin cream provided to patient to use daily with a bandage.  9. Return to clinic as needed.   Edrick Kins, DPM [facility]  Dr. Edrick Kins, [address]                                        [place], [place] [address]                Office [phone]  Fax [phone]

## 2018-11-05 ENCOUNTER — Ambulatory Visit: Payer: Medicare Other | Attending: Neurology | Admitting: Physical Therapy

## 2018-11-05 ENCOUNTER — Other Ambulatory Visit: Payer: Self-pay

## 2018-11-05 DIAGNOSIS — R262 Difficulty in walking, not elsewhere classified: Secondary | ICD-10-CM | POA: Insufficient documentation

## 2018-11-05 DIAGNOSIS — M25552 Pain in left hip: Secondary | ICD-10-CM | POA: Insufficient documentation

## 2018-11-05 DIAGNOSIS — R2681 Unsteadiness on feet: Secondary | ICD-10-CM | POA: Insufficient documentation

## 2018-11-05 DIAGNOSIS — G8929 Other chronic pain: Secondary | ICD-10-CM | POA: Diagnosis present

## 2018-11-05 DIAGNOSIS — M6281 Muscle weakness (generalized): Secondary | ICD-10-CM

## 2018-11-05 NOTE — Addendum Note (Signed)
Addended by: Louie Casa on: 11/05/2018 10:46 AM   Modules accepted: Orders

## 2018-11-05 NOTE — Therapy (Addendum)
[facility] [address] [place], [place], [address] Phone: [phone]   Fax:  [phone]  Physical Therapy Evaluation  Patient Details  Name: Karen [name] MRN: [id number] Date of Birth: 11-27-1950 Referring Provider (PT): Jennings Books   Encounter Date: 11/05/2018  PT End of Session - 11/05/18 0946    Visit Number  1    Number of Visits  10    Date for PT Re-Evaluation  12/03/18    Authorization Type  1/10 (Medicare)    PT Start Time  0830    PT Stop Time  0928    PT Time Calculation (min)  58 min    Equipment Utilized During Treatment  Gait belt    Activity Tolerance  Patient tolerated treatment well;Patient limited by pain    Behavior During Therapy  [facility] for tasks assessed/performed       Past Medical History:  Diagnosis Date  . Acquired trigger finger of both middle fingers   . Arthritis   . Breast cancer, right ([place]) 2011   Right Lumpectomy and Rad tx's.  . Depression   . GERD (gastroesophageal reflux disease)   . Hyperlipidemia   . Hypertension   . Hypothyroidism   . Narcolepsy and cataplexy   . Neuropathy   . Osteoporosis, post-menopausal   . RLS (restless legs syndrome)   . Sleep apnea     Past Surgical History:  Procedure Laterality Date  . ABDOMINAL HYSTERECTOMY    . APPENDECTOMY    . BREAST SURGERY    . CARDIAC CATHETERIZATION N/A 11/25/2014   Procedure: Left Heart Cath;  Surgeon: Teodoro Spray, MD;  Location: [place] [facility];  Service: Cardiovascular;  Laterality: N/A;  . CHOLECYSTECTOMY    . COLONOSCOPY WITH PROPOFOL N/A 08/05/2018   Procedure: COLONOSCOPY WITH PROPOFOL;  Surgeon: Lollie Sails, MD;  Location: [facility] ENDOSCOPY;  Service: Endoscopy;  Laterality: N/A;  . MASTECTOMY Right    partial/lumpectomy    There were no vitals filed for this visit.   Subjective Assessment - 11/05/18 0843    Subjective  Patient reports worsening peripheral neuropathy that is presenting as  worsening balance. Patient denies falls. Patient has increased numbness in feet which is made more uncomfortable by RLS. Main concern is loss of balance. Patient notices that she needs increased time to adjust to postural changes. Patient also c/o of stiff joints (hips > knees). Patient is having numbness and tingling in hands and feet. L hip pain described as "feeling out of socket." Patient has interrupted sleep. Denies all DNA.     Pertinent History  Increased severity over last 2 weeks    Limitations  Sitting;Standing    How long can you sit comfortably?  10 min    How long can you stand comfortably?  60 min with intermittent support    How long can you walk comfortably?  30 min (limited more by L hip pain and deconditioning)    Diagnostic tests  nerve conduction > 3 years ago    Patient Stated Goals  improve balance    Currently in Pain?  Yes    Pain Score  5     Pain Location  Foot    Pain Orientation  Left;Right    Pain Descriptors / Indicators  Tingling;Numbness;Burning    Pain Type  Chronic pain    Pain Onset  More than a month ago    Pain Frequency  Constant    Aggravating Factors  sitting; bending    Pain Relieving Factors  laying flat; reclining;     Effect of Pain on Daily Activities  limited activity; any sitting activities    Multiple Pain Sites  Yes      SUBJECTIVE Recent changes in overall health/medication: Noted in chart Directional pattern for falls: None noted; patient to collect data and report back at next visit. Prior history of physical therapy for balance: None Red flags (, personal history of cancer, )   OBJECTIVE  MUSCULOSKELETAL: Tremor: Absent Bulk: Normal Tone: Normal, no clonus  Posture Forward head, rounded and elevated shoulders. Patient has mildly increased lordosis. Patient shifts R in sitting and standing postures.  Gait Trendelenburg gait L >R; increased supination bilaterally. Overall gait speed noticeably decreased with unsteadiness  noted.   Strength R/L 5/3+ Hip flexion 4/4 Hip abduction (sitting) 3+/3+ Hip adduction 5/4 Knee extension 5/5 Knee flexion 5/5 Ankle Plantarflexion (sitting) 5/5 Ankle Dorsiflexion  PROM (aberrant findings only) < 60 degrees L hip flexion pain free (supine) < 15 degrees L hip IR pain free (supine) < 30 degress L hip ER pain free (supine)  NEUROLOGICAL:  Mental Status Patient is oriented to person, place and time.  Recent memory is intact.  Remote memory is intact.  Attention span and concentration are intact.  Expressive speech is intact.  Patient's fund of knowledge is within normal limits for educational level.  Cranial Nerves Visual acuity and visual fields are intact  Extraocular muscles are intact  Facial sensation is intact bilaterally  Facial strength is intact bilaterally  Hearing is normal as tested by gross conversation Normal phonation  Shoulder shrug strength is intact   Sensation Grossly intact to light touch bilateral UEs/LEs as determined by testing dermatomes C2-T2/L2-S2 respectively Proprioception and hot/cold testing deferred on this date   FUNCTIONAL OUTCOME MEASURES   Results Comments  BERG 41/56 Fall risk, in need of intervention  5TSTS 17 seconds, BUE on thighs Fall risk, in need of intervention     POSTURAL CONTROL TESTS   Clinical Test of Sensory Interaction for Balance    (CTSIB):  CONDITION TIME STRATEGY SWAY  Eyes open, firm surface 30 seconds ankle   Eyes closed, firm surface 30 seconds ankle Right/posterior  Eyes open, foam surface 23 seconds ankle Right/posterior  Eyes closed, foam surface  16 seconds ankle Right/posterior    TREATMENT Manual Intervention: Patient educated on the principles of manual technique and potential benefits. L hip long-axis distraction, 3 x 30 sec on, 60 sec off. Patient response: reports decreased pain, reports greater ease of movement   ASSESSMENT Patient is a 68 year old presenting to clinic  with chief complaints of worsening balance, increased L hip pain, and peripheral neuropathy in BLE. Upon examination, patient demonstrates deficits in strength, balance and L hip ROM as evidenced by 5 Times STS of 17 sec, Berg Balance of 41/56, (+) FABER B, pain free L hip flexion < 60 degrees. Patient's current presentation indicates ~80% risk of falls. Patient's progress may be limited due to chronicity of problem and comorbidities including OA and restless leg syndrome; however, patient's motivation is advantageous. Patient was able to achieve mild L hip pain relief during today's evaluation and responded positively to manual interventions. Patient will benefit from continued skilled therapeutic intervention to address deficits in strength, balance, mobility in order to decrease risk of falls, increase function, and improve overall QOL.   Objective measurements completed on examination: See above findings.    PT Education - 11/05/18  1039    Education Details  Prognosis, balance strategies, sleep hygiene    Person(s) Educated  Patient    Methods  Explanation;Demonstration;Handout    Comprehension  Need further instruction;Verbalized understanding       PT Short Term Goals - 11/05/18 1013      PT SHORT TERM GOAL #1   Title  Pt will be independent with HEP in order to improve strength and balance in order to decrease fall risk and improve function at home and work.    Baseline  Not initiated    Time  2    Period  Weeks    Status  New    Target Date  11/19/18        PT Long Term Goals - 11/05/18 1014      PT LONG TERM GOAL #1   Title  Patient will improve BERG by at least 3 points in order to demonstrate clinically significant improvement in balance.      Baseline  IE: 41/56    Time  4    Period  Weeks    Status  New    Target Date  12/03/18      PT LONG TERM GOAL #2   Title  Patient will decrease 5TSTS by at least 3 seconds in order to demonstrate clinically significant  improvement in LE strength and decrease risk of falls.    Baseline  IE: 17 s, BUE on thighs    Time  4    Period  Weeks    Status  New    Target Date  12/03/18      PT LONG TERM GOAL #3   Title  Patient will report intermittent L hip pain to be 3/10 or less on NPRS to demonstrate clinically significant improvement in pain and improve overall function.     Baseline  IE: constant 5/10    Time  4    Period  Weeks    Status  New    Target Date  12/03/18             Plan - 11/05/18 0948    Clinical Impression Statement  Patient is a 68 year old presenting to clinic with chief complaints of worsening balance, increased L hip pain, and peripheral neuropathy in BLE. Upon examination, patient demonstrates deficits in strength, balance and L hip ROM as evidenced by 5 Times STS of 17 sec, Berg Balance of 41/56, (+) FABER B, pain free L hip flexion < 60 degrees. Patient's current presentation indicates ~80% risk of falls. Patient's progress may be limited due to chronicity of problem and comorbidities including OA and restless leg syndrome; however, patient's motivation is advantageous. Patient was able to achieve mild L hip pain relief during today's evaluation and responded positively to manual interventions. Patient will benefit from continued skilled therapeutic intervention to address deficits in strength, balance, mobility in order to decrease risk of falls, increase function, and improve overall QOL.    Personal Factors and Comorbidities  Comorbidity 3+;Past/Current Experience;Fitness;Behavior Pattern;Age    Comorbidities  restless leg syndrome; lymphedema; hyperlipidemia; HTN; OA    Examination-Activity Limitations  Bed Mobility;Lift;Bend;Sleep;Sit;Transfers;Reach Overhead;Stairs    Examination-Participation Restrictions  Cleaning;Laundry;Yard Work;Other;Community Activity;Driving    Stability/Clinical Decision Making  Evolving/Moderate complexity    Clinical Decision Making  Moderate     Rehab Potential  Fair    PT Frequency  2x / week    PT Duration  4 weeks    PT Treatment/Interventions  ADLs/Self Care  Home Management;Moist Heat;Stair training;Gait training;Functional mobility training;Neuromuscular re-education;Balance training;Therapeutic exercise;Therapeutic activities;Patient/family education;Manual techniques;Taping;Dry needling;Cryotherapy    PT Next Visit Plan  orthostatic vitals next visit; L hip distraction; B glute strengthening; balance strategies    PT Home Exercise Plan  Sleep hygiene and LOB data collection    Consulted and Agree with Plan of Care  Patient       Patient will benefit from skilled therapeutic intervention in order to improve the following deficits and impairments:  Abnormal gait, Impaired sensation, Improper body mechanics, Pain, Decreased mobility, Postural dysfunction, Decreased activity tolerance, Decreased endurance, Decreased range of motion, Decreased strength, Decreased balance, Difficulty walking, Increased edema, Obesity  Visit Diagnosis: Muscle weakness (generalized)  Difficulty in walking, not elsewhere classified  Chronic left hip pain  Unsteadiness on feet     Problem List Patient Active Problem List   Diagnosis Date Noted  . Tachycardia 03/01/2018  . Benign breast lumps 11/26/2017  . Cancer ([employer]) 11/26/2017  . Depression 11/26/2017  . Hyperlipidemia 11/26/2017  . Hypertension 11/26/2017  . Osteoporosis, post-menopausal 11/26/2017  . Arthritis 03/26/2017  . Neuropathy 03/26/2017  . Leg pain, bilateral 03/26/2017  . Achilles tendinosis 03/26/2017  . RLS (restless legs syndrome) 03/26/2017  . Chronic deep vein thrombosis (DVT) of proximal vein of lower extremity ([place]) 03/26/2017  . Other insomnia 01/02/2017  . Chills without fever 12/26/2016  . Chronic pain of left heel 12/26/2016  . Increased band cell count 12/26/2016  . Vitamin D deficiency 11/14/2016  . Weight gain with edema 11/14/2016  . Other headache  syndrome 11/07/2016  . Hyponatremia 10/21/2016  . Prediabetes 08/08/2016  . Irritable bowel syndrome with constipation and diarrhea 01/20/2015  . Obesity, morbid ([place]) 01/20/2015  . Adult BMI 35.0-35.9 kg/sq m 12/24/2014  . Obstructive sleep apnea syndrome 12/03/2014  . Multiple joint pain 07/22/2014  . BRCA negative 01/20/2014  . Malignant neoplasm of right female breast [facility]) 12/15/2013   Myles Gip PT, DPT [phone] 11/05/2018, 10:43 AM  [facility] [address]. [place], [place], [address] Phone: [phone]   Fax:  [phone]  Name: Karen [name] MRN: [id number] Date of Birth: 07-14-1950

## 2018-11-12 ENCOUNTER — Other Ambulatory Visit: Payer: Self-pay

## 2018-11-12 ENCOUNTER — Encounter: Payer: Self-pay | Admitting: Physical Therapy

## 2018-11-12 ENCOUNTER — Ambulatory Visit: Payer: Medicare Other | Admitting: Physical Therapy

## 2018-11-12 DIAGNOSIS — R2681 Unsteadiness on feet: Secondary | ICD-10-CM

## 2018-11-12 DIAGNOSIS — M6281 Muscle weakness (generalized): Secondary | ICD-10-CM

## 2018-11-12 DIAGNOSIS — R262 Difficulty in walking, not elsewhere classified: Secondary | ICD-10-CM

## 2018-11-12 DIAGNOSIS — G8929 Other chronic pain: Secondary | ICD-10-CM

## 2018-11-12 NOTE — Therapy (Signed)
[facility] [address] [place], [place], [address] Phone: [phone]   Fax:  [phone]  Physical Therapy Treatment  Patient Details  Name: Karen [name] MRN: [id number] Date of Birth: 1951/05/30 Referring Provider (PT): Jennings Books   Encounter Date: 11/12/2018  PT End of Session - 11/12/18 0830    Visit Number  2    Number of Visits  10    Date for PT Re-Evaluation  12/03/18    Authorization Type  1/10 (Medicare)    PT Start Time  0830    PT Stop Time  0910    PT Time Calculation (min)  40 min    Equipment Utilized During Treatment  Gait belt    Activity Tolerance  Patient tolerated treatment well;Patient limited by pain    Behavior During Therapy  [facility] for tasks assessed/performed       Past Medical History:  Diagnosis Date  . Acquired trigger finger of both middle fingers   . Arthritis   . Breast cancer, right ([place]) 2011   Right Lumpectomy and Rad tx's.  . Depression   . GERD (gastroesophageal reflux disease)   . Hyperlipidemia   . Hypertension   . Hypothyroidism   . Narcolepsy and cataplexy   . Neuropathy   . Osteoporosis, post-menopausal   . RLS (restless legs syndrome)   . Sleep apnea     Past Surgical History:  Procedure Laterality Date  . ABDOMINAL HYSTERECTOMY    . APPENDECTOMY    . BREAST SURGERY    . CARDIAC CATHETERIZATION N/A 11/25/2014   Procedure: Left Heart Cath;  Surgeon: Teodoro Spray, MD;  Location: [place] CV LAB;  Service: Cardiovascular;  Laterality: N/A;  . CHOLECYSTECTOMY    . COLONOSCOPY WITH PROPOFOL N/A 08/05/2018   Procedure: COLONOSCOPY WITH PROPOFOL;  Surgeon: Lollie Sails, MD;  Location: [facility] ENDOSCOPY;  Service: Endoscopy;  Laterality: N/A;  . MASTECTOMY Right    partial/lumpectomy    There were no vitals filed for this visit.  Subjective Assessment - 11/12/18 0832    Subjective  Patient states that she has started walking every morning which she feels is  "good for" her. Patient feels her increased edema is contributing to her LOB. Patient went to PCP last Wednesday and was prescribed an additional BP medication. Patient states that she had a lot relieved pain after hip distraction with a 3/10 for ~48 hours after session.    Pertinent History  Increased severity over last 2 weeks    Limitations  Sitting;Standing    How long can you sit comfortably?  10 min    How long can you stand comfortably?  60 min with intermittent support    How long can you walk comfortably?  30 min (limited more by L hip pain and deconditioning)    Diagnostic tests  nerve conduction > 3 years ago    Patient Stated Goals  improve balance    Currently in Pain?  Yes    Pain Score  5     Pain Location  Hip    Pain Orientation  Left    Pain Descriptors / Indicators  Discomfort;Pressure    Pain Type  Chronic pain    Pain Onset  More than a month ago    Multiple Pain Sites  Yes    Pain Score  10    Pain Location  Foot    Pain Orientation  Right;Left    Pain Descriptors /  Indicators  Tingling;Pins and needles    Pain Type  Chronic pain;Neuropathic pain       TREATMENT  Pre-treatment assessment: Orthostatic vitals measured to rule out cardiac involvement with balance deficits with positional changes: Sitting: 139/84. Standing 141/91.   Manual Therapy: L hip, long-axis distraction 6x30 sec on: 1 min off utilizing oscillatory approach to improve removal of inflammatory markers.   Neuromuscular Re-education: Supine posterior pelvic tilts (improved body awareness and activation of gluteals for hip balance strategy), 3 sec hold x15 Supine hip adduction pillow squeeze (improved body awareness and activation of glute med for improved pelvic posture) with eccentric return, x15 Supine hip aBduction RTB (improved body awareness and activation of glute med for improved pelvic posture), 3 sec hold x15  // bars, airex:   Normal BOS, 90 sec static, no UE. VCs for active posture  and decreased hyperextension at knees.   Normal BOS, x30 heel/toe weight shift for improved ankle strategy   Patient educated throughout session on appropriate technique and form using multi-modal cueing, HEP, and activity modification. Patient articulated understanding and returned demonstration.  Patient Response to interventions: Patient reporting decreased irritation at L hip and improved awareness of maladaptive balance strategies.  ASSESSMENT Patient presents to clinic with excellent motivation to participate in therapy. Patient demonstrates deficits in strength, balance, and body mechanics as evidenced by limited hip clearance during glute bridge preparation, hyperextension at knees and hips in standing for stability strategy, and increased unsteadiness with postural changes. Patient assessed for orthostatic hypotension prior to initiation of active interventions and found to be negative for orthostatic hypotension. Patient able to achieve improved body awareness during today's session and responded positively to neuromuscular re-education interventions. Patient will benefit from continued skilled therapeutic intervention to address remaining deficits in strength, balance, and body mechanics in order to decrease risk of falls, increase function, and improve overall QOL.      PT Short Term Goals - 11/05/18 1013      PT SHORT TERM GOAL #1   Title  Pt will be independent with HEP in order to improve strength and balance in order to decrease fall risk and improve function at home and work.    Baseline  Not initiated    Time  2    Period  Weeks    Status  New    Target Date  11/19/18        PT Long Term Goals - 11/05/18 1014      PT LONG TERM GOAL #1   Title  Patient will improve BERG by at least 3 points in order to demonstrate clinically significant improvement in balance.      Baseline  IE: 41/56    Time  4    Period  Weeks    Status  New    Target Date  12/03/18      PT  LONG TERM GOAL #2   Title  Patient will decrease 5TSTS by at least 3 seconds in order to demonstrate clinically significant improvement in LE strength and decrease risk of falls.    Baseline  IE: 17 s, BUE on thighs    Time  4    Period  Weeks    Status  New    Target Date  12/03/18      PT LONG TERM GOAL #3   Title  Patient will report intermittent L hip pain to be 3/10 or less on NPRS to demonstrate clinically significant improvement in pain and improve overall function.  Baseline  IE: constant 5/10    Time  4    Period  Weeks    Status  New    Target Date  12/03/18            Plan - 11/12/18 0934    Clinical Impression Statement  Patient presents to clinic with excellent motivation to participate in therapy. Patient demonstrates deficits in strength, balance, and body mechanics as evidenced by limited hip clearance during glute bridge preparation, hyperextension at knees and hips in standing for stability strategy, and increased unsteadiness with postural changes. Patient assessed for orthostatic hypotension prior to initiation of active interventions and found to be negative for orthostatic hypotension. Patient able to achieve improved body awareness during today's session and responded positively to neuromuscular re-education interventions. Patient will benefit from continued skilled therapeutic intervention to address remaining deficits in strength, balance, and body mechanics in order to decrease risk of falls, increase function, and improve overall QOL.    Personal Factors and Comorbidities  Comorbidity 3+;Past/Current Experience;Fitness;Behavior Pattern;Age    Comorbidities  restless leg syndrome; lymphedema; hyperlipidemia; HTN; OA    Examination-Activity Limitations  Bed Mobility;Lift;Bend;Sleep;Sit;Transfers;Reach Overhead;Stairs    Examination-Participation Restrictions  Cleaning;Laundry;Yard Work;Other;Community Activity;Driving    Stability/Clinical Decision Making   Evolving/Moderate complexity    Rehab Potential  Fair    PT Frequency  2x / week    PT Duration  4 weeks    PT Treatment/Interventions  ADLs/Self Care Home Management;Moist Heat;Stair training;Gait training;Functional mobility training;Neuromuscular re-education;Balance training;Therapeutic exercise;Therapeutic activities;Patient/family education;Manual techniques;Taping;Dry needling;Cryotherapy    PT Next Visit Plan  L hip distraction; B glute strengthening, balance    PT Home Exercise Plan  Access Code: [email or web address]    Consulted and Agree with Plan of Care  Patient       Patient will benefit from skilled therapeutic intervention in order to improve the following deficits and impairments:  Abnormal gait, Impaired sensation, Improper body mechanics, Pain, Decreased mobility, Postural dysfunction, Decreased activity tolerance, Decreased endurance, Decreased range of motion, Decreased strength, Decreased balance, Difficulty walking, Increased edema, Obesity  Visit Diagnosis: Muscle weakness (generalized)  Difficulty in walking, not elsewhere classified  Chronic left hip pain  Unsteadiness on feet     Problem List Patient Active Problem List   Diagnosis Date Noted  . Tachycardia 03/01/2018  . Benign breast lumps 11/26/2017  . Cancer ([place]) 11/26/2017  . Depression 11/26/2017  . Hyperlipidemia 11/26/2017  . Hypertension 11/26/2017  . Osteoporosis, post-menopausal 11/26/2017  . Arthritis 03/26/2017  . Neuropathy 03/26/2017  . Leg pain, bilateral 03/26/2017  . Achilles tendinosis 03/26/2017  . RLS (restless legs syndrome) 03/26/2017  . Chronic deep vein thrombosis (DVT) of proximal vein of lower extremity ([place]) 03/26/2017  . Other insomnia 01/02/2017  . Chills without fever 12/26/2016  . Chronic pain of left heel 12/26/2016  . Increased band cell count 12/26/2016  . Vitamin D deficiency 11/14/2016  . Weight gain with edema 11/14/2016  . Other headache syndrome 11/07/2016  .  Hyponatremia 10/21/2016  . Prediabetes 08/08/2016  . Irritable bowel syndrome with constipation and diarrhea 01/20/2015  . Obesity, morbid ([place]) 01/20/2015  . Adult BMI 35.0-35.9 kg/sq m 12/24/2014  . Obstructive sleep apnea syndrome 12/03/2014  . Multiple joint pain 07/22/2014  . BRCA negative 01/20/2014  . Malignant neoplasm of right female breast [facility]) 12/15/2013   Myles Gip PT, DPT [phone] 11/12/2018, 9:53 AM  [place] [address]. [place], [place], [address] Phone: [phone]  Fax:  [phone]  Name: EILEEN KANGAS MRN: [id number] Date of Birth: 1950/09/04

## 2018-11-12 NOTE — Patient Instructions (Signed)
Access Code: [email or web address]  URL: [email or web address]  Date: 11/12/2018  Prepared by: Myles Gip   Exercises Supine Posterior Pelvic Tilt - 15 reps - 2 sets - 3 hold - 1x daily - 7x weekly Supine Hip Adduction Isometric with Ball - 15 reps - 2 sets - 3 hold - 1x daily - 7x weekly Hooklying Clamshell with Resistance - 15 reps - 2 sets - 3 hold - 1x daily - 7x weekly

## 2018-11-14 ENCOUNTER — Ambulatory Visit: Payer: Medicare Other | Admitting: Physical Therapy

## 2018-11-14 ENCOUNTER — Other Ambulatory Visit: Payer: Self-pay

## 2018-11-14 ENCOUNTER — Encounter: Payer: Self-pay | Admitting: Physical Therapy

## 2018-11-14 DIAGNOSIS — R2681 Unsteadiness on feet: Secondary | ICD-10-CM | POA: Diagnosis not present

## 2018-11-14 DIAGNOSIS — M6281 Muscle weakness (generalized): Secondary | ICD-10-CM

## 2018-11-14 DIAGNOSIS — M25552 Pain in left hip: Secondary | ICD-10-CM

## 2018-11-14 DIAGNOSIS — G8929 Other chronic pain: Secondary | ICD-10-CM

## 2018-11-14 DIAGNOSIS — R262 Difficulty in walking, not elsewhere classified: Secondary | ICD-10-CM

## 2018-11-14 NOTE — Therapy (Signed)
[facility] [address] [place], [place], [address] Phone: [phone]   Fax:  [phone]  Physical Therapy Treatment  Patient Details  Name: Karen [name] MRN: [id number] Date of Birth: 18-Oct-1950 Referring Provider (PT): Jennings Books   Encounter Date: 11/14/2018  PT End of Session - 11/14/18 1434    Visit Number  3    Number of Visits  10    Date for PT Re-Evaluation  12/03/18    Authorization Type  3/10 (Medicare)    PT Start Time  1400    PT Stop Time  1434    PT Time Calculation (min)  34 min    Equipment Utilized During Treatment  Gait belt    Activity Tolerance  Patient limited by pain    Behavior During Therapy  [facility] for tasks assessed/performed       Past Medical History:  Diagnosis Date  . Acquired trigger finger of both middle fingers   . Arthritis   . Breast cancer, right ([place]) 2011   Right Lumpectomy and Rad tx's.  . Depression   . GERD (gastroesophageal reflux disease)   . Hyperlipidemia   . Hypertension   . Hypothyroidism   . Narcolepsy and cataplexy   . Neuropathy   . Osteoporosis, post-menopausal   . RLS (restless legs syndrome)   . Sleep apnea     Past Surgical History:  Procedure Laterality Date  . ABDOMINAL HYSTERECTOMY    . APPENDECTOMY    . BREAST SURGERY    . CARDIAC CATHETERIZATION N/A 11/25/2014   Procedure: Left Heart Cath;  Surgeon: Teodoro Spray, MD;  Location: [place] [facility];  Service: Cardiovascular;  Laterality: N/A;  . CHOLECYSTECTOMY    . COLONOSCOPY WITH PROPOFOL N/A 08/05/2018   Procedure: COLONOSCOPY WITH PROPOFOL;  Surgeon: Lollie Sails, MD;  Location: [facility] ENDOSCOPY;  Service: Endoscopy;  Laterality: N/A;  . MASTECTOMY Right    partial/lumpectomy    There were no vitals filed for this visit.  Subjective Assessment - 11/14/18 1400    Subjective  Patient states that she has had a lot of increased swelling since her last visit with R>L. Patient states that  the swelling has been causing a lot of discomfort. Patient has an appointment for lymphatic massage tomorrow morning. Patient states that her hip did not hurt after the last session.    Pertinent History  Increased severity over last 2 weeks    Limitations  Sitting;Standing    How long can you sit comfortably?  10 min    How long can you stand comfortably?  60 min with intermittent support    How long can you walk comfortably?  30 min (limited more by L hip pain and deconditioning)    Diagnostic tests  nerve conduction > 3 years ago    Patient Stated Goals  improve balance    Currently in Pain?  Yes    Pain Score  3     Pain Location  Hip    Pain Orientation  Left    Pain Descriptors / Indicators  Discomfort;Pressure    Pain Type  Chronic pain    Pain Onset  More than a month ago    Pain Frequency  Constant    Multiple Pain Sites  Yes    Pain Score  9    Pain Location  Foot    Pain Orientation  Right;Left    Pain Descriptors / Indicators  Tingling;Pins and needles;Tightness  Pain Type  Neuropathic pain    Pain Onset  More than a month ago       TREATMENT  Pre-treatment assessment: Lymphedema circumference (proximal to malleoli): L 9.75 in; R 10.375 in.  Neuromuscular Re-education: Supine diaphragmatic breathing for pain modulation and gentle lymphatic stimulation Supine body scan/gentle mobility for pain modulation and gentle lymphatic stimulation  Therapeutic Exercise: Nu Step, L4, BUE/BLE for improved blood flow and stimulation of the lymphatic system to minimize pain and/or edema, x10 min  Patient educated throughout session on appropriate technique and form using multi-modal cueing, HEP, and activity modification. Patient articulated understanding and returned demonstration.  Patient Response to interventions: Patient denies any increased pain and expressed interest in learning more pain modulation techniques.  ASSESSMENT Patient presents to clinic with decreased  motivation to participate in therapy 2/2 to 9/10 pain in B feet related to increased lymphedema. Patient demonstrates deficits in gait, balance, activity tolerance, and mobility. Patient able to achieve coordinate diaphragmatic breath during today's session and responded positively to pain modulation interventions and education. Patient will benefit from continued skilled therapeutic intervention to address remaining deficits in gait, balance, activity tolerance, and mobility in order to decrease risk of falls, increase function, and improve overall QOL.     PT Short Term Goals - 11/05/18 1013      PT SHORT TERM GOAL #1   Title  Pt will be independent with HEP in order to improve strength and balance in order to decrease fall risk and improve function at home and work.    Baseline  Not initiated    Time  2    Period  Weeks    Status  New    Target Date  11/19/18        PT Long Term Goals - 11/05/18 1014      PT LONG TERM GOAL #1   Title  Patient will improve BERG by at least 3 points in order to demonstrate clinically significant improvement in balance.      Baseline  IE: 41/56    Time  4    Period  Weeks    Status  New    Target Date  12/03/18      PT LONG TERM GOAL #2   Title  Patient will decrease 5TSTS by at least 3 seconds in order to demonstrate clinically significant improvement in LE strength and decrease risk of falls.    Baseline  IE: 17 s, BUE on thighs    Time  4    Period  Weeks    Status  New    Target Date  12/03/18      PT LONG TERM GOAL #3   Title  Patient will report intermittent L hip pain to be 3/10 or less on NPRS to demonstrate clinically significant improvement in pain and improve overall function.     Baseline  IE: constant 5/10    Time  4    Period  Weeks    Status  New    Target Date  12/03/18            Plan - 11/14/18 1615    Clinical Impression Statement  Patient presents to clinic with decreased motivation to participate in therapy 2/2  to 9/10 pain in B feet related to increased lymphedema. Patient demonstrates deficits in gait, balance, activity tolerance, and mobility. Patient able to achieve coordinate diaphragmatic breath during today's session and responded positively to pain modulation interventions and education. Patient will benefit  from continued skilled therapeutic intervention to address remaining deficits in gait, balance, activity tolerance, and mobility in order to decrease risk of falls, increase function, and improve overall QOL.    Personal Factors and Comorbidities  Comorbidity 3+;Past/Current Experience;Fitness;Behavior Pattern;Age    Comorbidities  restless leg syndrome; lymphedema; hyperlipidemia; HTN; OA    Examination-Activity Limitations  Bed Mobility;Lift;Bend;Sleep;Sit;Transfers;Reach Overhead;Stairs    Examination-Participation Restrictions  Cleaning;Laundry;Yard Work;Other;Community Activity;Driving    Stability/Clinical Decision Making  Evolving/Moderate complexity    Rehab Potential  Fair    PT Frequency  2x / week    PT Duration  4 weeks    PT Treatment/Interventions  ADLs/Self Care Home Management;Moist Heat;Stair training;Gait training;Functional mobility training;Neuromuscular re-education;Balance training;Therapeutic exercise;Therapeutic activities;Patient/family education;Manual techniques;Taping;Dry needling;Cryotherapy    PT Next Visit Plan  L hip distraction; B glute strengthening, balance    PT Home Exercise Plan  Access Code: [email or web address]    Consulted and Agree with Plan of Care  Patient       Patient will benefit from skilled therapeutic intervention in order to improve the following deficits and impairments:  Abnormal gait, Impaired sensation, Improper body mechanics, Pain, Decreased mobility, Postural dysfunction, Decreased activity tolerance, Decreased endurance, Decreased range of motion, Decreased strength, Decreased balance, Difficulty walking, Increased edema, Obesity  Visit  Diagnosis: Muscle weakness (generalized)  Difficulty in walking, not elsewhere classified  Chronic left hip pain  Unsteadiness on feet     Problem List Patient Active Problem List   Diagnosis Date Noted  . Tachycardia 03/01/2018  . Benign breast lumps 11/26/2017  . Cancer ([place]) 11/26/2017  . Depression 11/26/2017  . Hyperlipidemia 11/26/2017  . Hypertension 11/26/2017  . Osteoporosis, post-menopausal 11/26/2017  . Arthritis 03/26/2017  . Neuropathy 03/26/2017  . Leg pain, bilateral 03/26/2017  . Achilles tendinosis 03/26/2017  . RLS (restless legs syndrome) 03/26/2017  . Chronic deep vein thrombosis (DVT) of proximal vein of lower extremity ([place]) 03/26/2017  . Other insomnia 01/02/2017  . Chills without fever 12/26/2016  . Chronic pain of left heel 12/26/2016  . Increased band cell count 12/26/2016  . Vitamin D deficiency 11/14/2016  . Weight gain with edema 11/14/2016  . Other headache syndrome 11/07/2016  . Hyponatremia 10/21/2016  . Prediabetes 08/08/2016  . Irritable bowel syndrome with constipation and diarrhea 01/20/2015  . Obesity, morbid ([place]) 01/20/2015  . Adult BMI 35.0-35.9 kg/sq m 12/24/2014  . Obstructive sleep apnea syndrome 12/03/2014  . Multiple joint pain 07/22/2014  . BRCA negative 01/20/2014  . Malignant neoplasm of right female breast [facility]) 12/15/2013   Myles Gip PT, DPT [phone] 11/14/2018, 4:33 PM  [facility] [address] [place], [place], [address] Phone: [phone]   Fax:  [phone]  Name: Karen [name] MRN: [id number] Date of Birth: February 27, 1951

## 2018-11-18 ENCOUNTER — Ambulatory Visit: Payer: Medicare Other | Admitting: Physical Therapy

## 2018-11-19 ENCOUNTER — Encounter: Payer: Self-pay | Admitting: Physical Therapy

## 2018-11-19 ENCOUNTER — Ambulatory Visit: Payer: Medicare Other | Attending: Neurology | Admitting: Physical Therapy

## 2018-11-19 ENCOUNTER — Other Ambulatory Visit: Payer: Self-pay

## 2018-11-19 DIAGNOSIS — R262 Difficulty in walking, not elsewhere classified: Secondary | ICD-10-CM

## 2018-11-19 DIAGNOSIS — M25552 Pain in left hip: Secondary | ICD-10-CM | POA: Insufficient documentation

## 2018-11-19 DIAGNOSIS — G8929 Other chronic pain: Secondary | ICD-10-CM | POA: Diagnosis present

## 2018-11-19 DIAGNOSIS — M6281 Muscle weakness (generalized): Secondary | ICD-10-CM | POA: Diagnosis present

## 2018-11-19 DIAGNOSIS — R2681 Unsteadiness on feet: Secondary | ICD-10-CM

## 2018-11-19 NOTE — Therapy (Signed)
[facility] [address] [place], [place], [address] Phone: [phone]   Fax:  [phone]  Physical Therapy Treatment  Patient Details  Name: Karen [name] MRN: [id number] Date of Birth: 12/08/1950 Referring Provider (PT): Jennings Books   Encounter Date: 11/19/2018  PT End of Session - 11/19/18 1146    Visit Number  4    Number of Visits  10    Date for PT Re-Evaluation  12/03/18    Authorization Type  4/10 (Medicare)    PT Start Time  1105    PT Stop Time  1146    PT Time Calculation (min)  41 min    Equipment Utilized During Treatment  Gait belt    Activity Tolerance  Patient tolerated treatment well    Behavior During Therapy  [facility] for tasks assessed/performed       Past Medical History:  Diagnosis Date  . Acquired trigger finger of both middle fingers   . Arthritis   . Breast cancer, right ([place]) 2011   Right Lumpectomy and Rad tx's.  . Depression   . GERD (gastroesophageal reflux disease)   . Hyperlipidemia   . Hypertension   . Hypothyroidism   . Narcolepsy and cataplexy   . Neuropathy   . Osteoporosis, post-menopausal   . RLS (restless legs syndrome)   . Sleep apnea     Past Surgical History:  Procedure Laterality Date  . ABDOMINAL HYSTERECTOMY    . APPENDECTOMY    . BREAST SURGERY    . CARDIAC CATHETERIZATION N/A 11/25/2014   Procedure: Left Heart Cath;  Surgeon: Teodoro Spray, MD;  Location: [place] [facility];  Service: Cardiovascular;  Laterality: N/A;  . CHOLECYSTECTOMY    . COLONOSCOPY WITH PROPOFOL N/A 08/05/2018   Procedure: COLONOSCOPY WITH PROPOFOL;  Surgeon: Lollie Sails, MD;  Location: [facility] ENDOSCOPY;  Service: Endoscopy;  Laterality: N/A;  . MASTECTOMY Right    partial/lumpectomy    There were no vitals filed for this visit.  Subjective Assessment - 11/19/18 1107    Subjective  Patient reports that she had no increased pain after the last session. Patient saw her lymphatic  massage therapist and has been using her compression socks and device. Patient notes that she has had increased wheezing  with exertion which she discussed with her PCP. PCP referred her to cardiology which she will see on Tuesday, 11/26/2018. Numbness and tingling with walking as well overall heaviness from increased lymphedema.     Pertinent History  Increased severity over last 2 weeks    Limitations  Sitting;Standing    How long can you sit comfortably?  10 min    How long can you stand comfortably?  60 min with intermittent support    How long can you walk comfortably?  30 min (limited more by L hip pain and deconditioning)    Diagnostic tests  nerve conduction > 3 years ago    Patient Stated Goals  improve balance    Currently in Pain?  Yes    Pain Score  3     Pain Location  Leg    Pain Orientation  Left    Pain Descriptors / Indicators  Numbness;Pins and needles    Pain Type  Chronic pain    Pain Onset  More than a month ago    Pain Onset  More than a month ago      TREATMENT  Neuromuscular Re-education: Narrow BOS, unsupported standing, x30 sec.  Narrow BOS, unsupported standing with multidirectional perturbations x30 sec Tandem stance, 2x30 sec, BLE, intermittent support for improved ankle strategy Airex balance beam, lateral weight shifts x20 for balance recovery Airex balance beam, knee bends x10 for balance recovery BOSU (flat side up), wide BOS standing with SUE support, with posterior perturbations x2 min (~20x perturbations) to promote ankle strategy   Patient educated throughout session on appropriate technique and form using multi-modal cueing, HEP, and activity modification. Patient articulated understanding and returned demonstration.  Patient Response to interventions: Patient expressed confidence in ability to practice strategies.  ASSESSMENT Patient presents to clinic with excellent motivation to participate in therapy. Patient demonstrates deficits in balance  as evidenced by increased reliance on external support and hyperextension of LE joints for stability. Patient able to achieve ankle strategy during today's session and responded positively to neuromuscular re-ed interventions. Patient will benefit from continued skilled therapeutic intervention to address remaining deficits in balance in order to decrease risk of falls, increase function, and improve overall QOL.    PT Short Term Goals - 11/05/18 1013      PT SHORT TERM GOAL #1   Title  Pt will be independent with HEP in order to improve strength and balance in order to decrease fall risk and improve function at home and work.    Baseline  Not initiated    Time  2    Period  Weeks    Status  New    Target Date  11/19/18        PT Long Term Goals - 11/05/18 1014      PT LONG TERM GOAL #1   Title  Patient will improve BERG by at least 3 points in order to demonstrate clinically significant improvement in balance.      Baseline  IE: 41/56    Time  4    Period  Weeks    Status  New    Target Date  12/03/18      PT LONG TERM GOAL #2   Title  Patient will decrease 5TSTS by at least 3 seconds in order to demonstrate clinically significant improvement in LE strength and decrease risk of falls.    Baseline  IE: 17 s, BUE on thighs    Time  4    Period  Weeks    Status  New    Target Date  12/03/18      PT LONG TERM GOAL #3   Title  Patient will report intermittent L hip pain to be 3/10 or less on NPRS to demonstrate clinically significant improvement in pain and improve overall function.     Baseline  IE: constant 5/10    Time  4    Period  Weeks    Status  New    Target Date  12/03/18            Plan - 11/19/18 1217    Clinical Impression Statement  Patient presents to clinic with excellent motivation to participate in therapy. Patient demonstrates deficits in balance as evidenced by increased reliance on external support and hyperextension of LE joints for stability.  Patient able to achieve ankle strategy during today's session and responded positively to neuromuscular re-ed interventions. Patient will benefit from continued skilled therapeutic intervention to address remaining deficits in balance in order to decrease risk of falls, increase function, and improve overall QOL.    Personal Factors and Comorbidities  Comorbidity 3+;Past/Current Experience;Fitness;Behavior Pattern;Age    Comorbidities  restless leg syndrome;  lymphedema; hyperlipidemia; HTN; OA    Examination-Activity Limitations  Bed Mobility;Lift;Bend;Sleep;Sit;Transfers;Reach Overhead;Stairs    Examination-Participation Restrictions  Cleaning;Laundry;Yard Work;Other;Community Activity;Driving    Stability/Clinical Decision Making  Evolving/Moderate complexity    Rehab Potential  Fair    PT Frequency  2x / week    PT Duration  4 weeks    PT Treatment/Interventions  ADLs/Self Care Home Management;Moist Heat;Stair training;Gait training;Functional mobility training;Neuromuscular re-education;Balance training;Therapeutic exercise;Therapeutic activities;Patient/family education;Manual techniques;Taping;Dry needling;Cryotherapy    PT Home Exercise Plan  Access Code: [email or web address]    Consulted and Agree with Plan of Care  Patient       Patient will benefit from skilled therapeutic intervention in order to improve the following deficits and impairments:  Abnormal gait, Impaired sensation, Improper body mechanics, Pain, Decreased mobility, Postural dysfunction, Decreased activity tolerance, Decreased endurance, Decreased range of motion, Decreased strength, Decreased balance, Difficulty walking, Increased edema, Obesity  Visit Diagnosis: Muscle weakness (generalized)  Difficulty in walking, not elsewhere classified  Chronic left hip pain  Unsteadiness on feet     Problem List Patient Active Problem List   Diagnosis Date Noted  . Tachycardia 03/01/2018  . Benign breast lumps 11/26/2017  . Cancer  ([place]) 11/26/2017  . Depression 11/26/2017  . Hyperlipidemia 11/26/2017  . Hypertension 11/26/2017  . Osteoporosis, post-menopausal 11/26/2017  . Arthritis 03/26/2017  . Neuropathy 03/26/2017  . Leg pain, bilateral 03/26/2017  . Achilles tendinosis 03/26/2017  . RLS (restless legs syndrome) 03/26/2017  . Chronic deep vein thrombosis (DVT) of proximal vein of lower extremity ([place]) 03/26/2017  . Other insomnia 01/02/2017  . Chills without fever 12/26/2016  . Chronic pain of left heel 12/26/2016  . Increased band cell count 12/26/2016  . Vitamin D deficiency 11/14/2016  . Weight gain with edema 11/14/2016  . Other headache syndrome 11/07/2016  . Hyponatremia 10/21/2016  . Prediabetes 08/08/2016  . Irritable bowel syndrome with constipation and diarrhea 01/20/2015  . Obesity, morbid ([place]) 01/20/2015  . Adult BMI 35.0-35.9 kg/sq m 12/24/2014  . Obstructive sleep apnea syndrome 12/03/2014  . Multiple joint pain 07/22/2014  . BRCA negative 01/20/2014  . Malignant neoplasm of right female breast [facility]) 12/15/2013   Myles Gip PT, DPT [phone] 11/19/2018, 12:26 PM  [facility] [address] [place], [place], [address] Phone: [phone]   Fax:  [phone]  Name: Karen [name] MRN: [id number] Date of Birth: 1951/03/04

## 2018-11-20 ENCOUNTER — Encounter: Payer: Medicare Other | Admitting: Physical Therapy

## 2018-11-21 ENCOUNTER — Encounter: Payer: Self-pay | Admitting: Physical Therapy

## 2018-11-21 ENCOUNTER — Other Ambulatory Visit: Payer: Self-pay

## 2018-11-21 ENCOUNTER — Ambulatory Visit: Payer: Medicare Other | Admitting: Physical Therapy

## 2018-11-21 DIAGNOSIS — G8929 Other chronic pain: Secondary | ICD-10-CM

## 2018-11-21 DIAGNOSIS — R2681 Unsteadiness on feet: Secondary | ICD-10-CM

## 2018-11-21 DIAGNOSIS — R262 Difficulty in walking, not elsewhere classified: Secondary | ICD-10-CM

## 2018-11-21 DIAGNOSIS — M6281 Muscle weakness (generalized): Secondary | ICD-10-CM | POA: Diagnosis not present

## 2018-11-21 NOTE — Therapy (Signed)
[facility] [address]. [place], [place], [address] Phone: [phone]   Fax:  [phone]  Physical Therapy Treatment  Patient Details  Name: Karen [name] MRN: [id number] Date of Birth: [birth year] Referring Provider (PT): Jennings Books   Encounter Date: 11/21/2018  PT End of Session - 11/21/18 0804    Visit Number  5    Number of Visits  10    Date for PT Re-Evaluation  12/03/18    Authorization Type  5/10 (Medicare)    PT Start Time  0759    PT Stop Time  0840    PT Time Calculation (min)  41 min    Equipment Utilized During Treatment  Gait belt    Activity Tolerance  Patient tolerated treatment well    Behavior During Therapy  [facility] for tasks assessed/performed       Past Medical History:  Diagnosis Date  . Acquired trigger finger of both middle fingers   . Arthritis   . Breast cancer, right ([place]) 2011   Right Lumpectomy and Rad tx's.  . Depression   . GERD (gastroesophageal reflux disease)   . Hyperlipidemia   . Hypertension   . Hypothyroidism   . Narcolepsy and cataplexy   . Neuropathy   . Osteoporosis, post-menopausal   . RLS (restless legs syndrome)   . Sleep apnea     Past Surgical History:  Procedure Laterality Date  . ABDOMINAL HYSTERECTOMY    . APPENDECTOMY    . BREAST SURGERY    . CARDIAC CATHETERIZATION N/A 11/25/2014   Procedure: Left Heart Cath;  Surgeon: Teodoro Spray, MD;  Location: [place] [facility];  Service: Cardiovascular;  Laterality: N/A;  . CHOLECYSTECTOMY    . COLONOSCOPY WITH PROPOFOL N/A 08/05/2018   Procedure: COLONOSCOPY WITH PROPOFOL;  Surgeon: Lollie Sails, MD;  Location: [facility];  Service: Endoscopy;  Laterality: N/A;  . MASTECTOMY Right    partial/lumpectomy    There were no vitals filed for this visit.  Subjective Assessment - 11/21/18 0801    Subjective  Patient reports that she walked this morning. Patient denies any increase in pain. Patient has been  exploring her balance and has noticed that when she walks she looks down.     Pertinent History  Increased severity over last 2 weeks    Limitations  Sitting;Standing    How long can you sit comfortably?  10 min    How long can you stand comfortably?  60 min with intermittent support    How long can you walk comfortably?  30 min (limited more by L hip pain and deconditioning)    Diagnostic tests  nerve conduction > 3 years ago    Patient Stated Goals  improve balance    Currently in Pain?  Yes    Pain Score  4     Pain Location  Leg    Pain Orientation  Left;Right    Pain Descriptors / Indicators  Tightness    Pain Type  Chronic pain;Neuropathic pain    Pain Onset  More than a month ago    Pain Onset  More than a month ago       TREATMENT  Neuromuscular Re-education: Gastroc stretch for improved elasticity and ability to react to dynamic balance demands 4x15 sec B Soleus stretch for improved elasticity and ability to react to dynamic balance demands 4x15 sec B  Patient instructed in advanced balance exercise  Gait outside in hallway: Forward  walking with head turns up/down, side/side 6x80 feet each Forward walking with head turns and identification of visual stimulus 4x80 Forward walking, fast/slow speed change with abrupt stops 2x80 feet  STS with emphasis placed on gaze stability and balance recovery with positional changes, x10, BUE support  Patient educated throughout session on appropriate technique and form using multi-modal cueing, HEP, and activity modification. Patient articulated understanding and returned demonstration. Patient educated on balance strategies and benefits of AD (walking stick/ grocery cart) on days when input from feet is diminished.  Patient Response to interventions: Patient able to articulate balance strategies in logical fashion and determine appropriate response in context dependent scenarios  ASSESSMENT Patient presents to clinic with excellent  motivation to participate in therapy. Patient demonstrates deficits in balance (static and dynamic) as evidenced by 6 LOB with self-recovery during dynamic gait. Patient able to achieve understanding of balance systems and appropriate strategies and compensatory patterns during today's session and responded positively to active and educational interventions. Patient will benefit from continued skilled therapeutic intervention to address remaining deficits in balance in order to decrease risk of falls, increase function, and improve overall QOL.     PT Short Term Goals - 11/05/18 1013      PT SHORT TERM GOAL #1   Title  Pt will be independent with HEP in order to improve strength and balance in order to decrease fall risk and improve function at home and work.    Baseline  Not initiated    Time  2    Period  Weeks    Status  New    Target Date  11/19/18        PT Long Term Goals - 11/05/18 1014      PT LONG TERM GOAL #1   Title  Patient will improve BERG by at least 3 points in order to demonstrate clinically significant improvement in balance.      Baseline  IE: 41/56    Time  4    Period  Weeks    Status  New    Target Date  12/03/18      PT LONG TERM GOAL #2   Title  Patient will decrease 5TSTS by at least 3 seconds in order to demonstrate clinically significant improvement in LE strength and decrease risk of falls.    Baseline  IE: 17 s, BUE on thighs    Time  4    Period  Weeks    Status  New    Target Date  12/03/18      PT LONG TERM GOAL #3   Title  Patient will report intermittent L hip pain to be 3/10 or less on NPRS to demonstrate clinically significant improvement in pain and improve overall function.     Baseline  IE: constant 5/10    Time  4    Period  Weeks    Status  New    Target Date  12/03/18            Plan - 11/21/18 0847    Clinical Impression Statement  Patient presents to clinic with excellent motivation to participate in therapy. Patient  demonstrates deficits in balance (static and dynamic) as evidenced by 6 LOB with self-recovery during dynamic gait. Patient able to achieve understanding of balance systems and appropriate strategies and compensatory patterns during today's session and responded positively to active and educational interventions. Patient will benefit from continued skilled therapeutic intervention to address remaining deficits in balance in order  to decrease risk of falls, increase function, and improve overall QOL.    Personal Factors and Comorbidities  Comorbidity 3+;Past/Current Experience;Fitness;Behavior Pattern;Age    Comorbidities  restless leg syndrome; lymphedema; hyperlipidemia; HTN; OA    Examination-Activity Limitations  Bed Mobility;Lift;Bend;Sleep;Sit;Transfers;Reach Overhead;Stairs    Examination-Participation Restrictions  Cleaning;Laundry;Yard Work;Other;Community Activity;Driving    Stability/Clinical Decision Making  Evolving/Moderate complexity    Rehab Potential  Fair    PT Frequency  2x / week    PT Duration  4 weeks    PT Treatment/Interventions  ADLs/Self Care Home Management;Moist Heat;Stair training;Gait training;Functional mobility training;Neuromuscular re-education;Balance training;Therapeutic exercise;Therapeutic activities;Patient/family education;Manual techniques;Taping;Dry needling;Cryotherapy    PT Next Visit Plan  L hip distraction; B glute strengthening, balance    PT Home Exercise Plan  Access Code: [email or web address]    Consulted and Agree with Plan of Care  Patient       Patient will benefit from skilled therapeutic intervention in order to improve the following deficits and impairments:  Abnormal gait, Impaired sensation, Improper body mechanics, Pain, Decreased mobility, Postural dysfunction, Decreased activity tolerance, Decreased endurance, Decreased range of motion, Decreased strength, Decreased balance, Difficulty walking, Increased edema, Obesity  Visit Diagnosis: Muscle  weakness (generalized)  Difficulty in walking, not elsewhere classified  Chronic left hip pain  Unsteadiness on feet     Problem List Patient Active Problem List   Diagnosis Date Noted  . Tachycardia 03/01/2018  . Benign breast lumps 11/26/2017  . Cancer ([place]) 11/26/2017  . Depression 11/26/2017  . Hyperlipidemia 11/26/2017  . Hypertension 11/26/2017  . Osteoporosis, post-menopausal 11/26/2017  . Arthritis 03/26/2017  . Neuropathy 03/26/2017  . Leg pain, bilateral 03/26/2017  . Achilles tendinosis 03/26/2017  . RLS (restless legs syndrome) 03/26/2017  . Chronic deep vein thrombosis (DVT) of proximal vein of lower extremity ([place]) 03/26/2017  . Other insomnia 01/02/2017  . Chills without fever 12/26/2016  . Chronic pain of left heel 12/26/2016  . Increased band cell count 12/26/2016  . Vitamin D deficiency 11/14/2016  . Weight gain with edema 11/14/2016  . Other headache syndrome 11/07/2016  . Hyponatremia 10/21/2016  . Prediabetes 08/08/2016  . Irritable bowel syndrome with constipation and diarrhea 01/20/2015  . Obesity, morbid ([place]) 01/20/2015  . Adult BMI 35.0-35.9 kg/sq m 12/24/2014  . Obstructive sleep apnea syndrome 12/03/2014  . Multiple joint pain 07/22/2014  . BRCA negative 01/20/2014  . Malignant neoplasm of right female breast [facility]) 12/15/2013   Myles Gip PT, DPT [phone] 11/21/2018, 8:57 AM  [facility] [address]. [place], [place], [address] Phone: [phone]   Fax:  [phone]  Name: Karen [name] MRN: [id number] Date of Birth: 1951/04/24

## 2018-11-25 ENCOUNTER — Ambulatory Visit: Payer: Medicare Other | Admitting: Physical Therapy

## 2018-11-25 ENCOUNTER — Encounter: Payer: Self-pay | Admitting: Physical Therapy

## 2018-11-25 ENCOUNTER — Other Ambulatory Visit: Payer: Self-pay

## 2018-11-25 DIAGNOSIS — M6281 Muscle weakness (generalized): Secondary | ICD-10-CM

## 2018-11-25 DIAGNOSIS — R2681 Unsteadiness on feet: Secondary | ICD-10-CM

## 2018-11-25 DIAGNOSIS — R262 Difficulty in walking, not elsewhere classified: Secondary | ICD-10-CM

## 2018-11-25 DIAGNOSIS — G8929 Other chronic pain: Secondary | ICD-10-CM

## 2018-11-25 DIAGNOSIS — M25552 Pain in left hip: Secondary | ICD-10-CM

## 2018-11-25 NOTE — Therapy (Signed)
[facility] [address] [place], [place], [address] Phone: [phone]   Fax:  [phone]  Physical Therapy Treatment  Patient Details  Name: Karen [name] MRN: [id number] Date of Birth: Feb 19, 1951 Referring Provider (PT): Jennings Books   Encounter Date: 11/25/2018  PT End of Session - 11/25/18 0953    Visit Number  6    Number of Visits  10    Date for PT Re-Evaluation  12/03/18    Authorization Type  6/10 (Medicare)    PT Start Time  0802    PT Stop Time  0843    PT Time Calculation (min)  41 min    Equipment Utilized During Treatment  Gait belt    Activity Tolerance  Patient tolerated treatment well    Behavior During Therapy  [facility] for tasks assessed/performed       Past Medical History:  Diagnosis Date  . Acquired trigger finger of both middle fingers   . Arthritis   . Breast cancer, right ([place]) 2011   Right Lumpectomy and Rad tx's.  . Depression   . GERD (gastroesophageal reflux disease)   . Hyperlipidemia   . Hypertension   . Hypothyroidism   . Narcolepsy and cataplexy   . Neuropathy   . Osteoporosis, post-menopausal   . RLS (restless legs syndrome)   . Sleep apnea     Past Surgical History:  Procedure Laterality Date  . ABDOMINAL HYSTERECTOMY    . APPENDECTOMY    . BREAST SURGERY    . CARDIAC CATHETERIZATION N/A 11/25/2014   Procedure: Left Heart Cath;  Surgeon: Teodoro Spray, MD;  Location: [place] [facility];  Service: Cardiovascular;  Laterality: N/A;  . CHOLECYSTECTOMY    . COLONOSCOPY WITH PROPOFOL N/A 08/05/2018   Procedure: COLONOSCOPY WITH PROPOFOL;  Surgeon: Lollie Sails, MD;  Location: [facility] END[facility]COPY;  Service: Endoscopy;  Laterality: N/A;  . MASTECTOMY Right    partial/lumpectomy    There were no vitals filed for this visit.  Subjective Assessment - 11/25/18 0803    Subjective  Patient states there are no significant concerns or changes since the last visit. No falls. Patient  states that she has experimented using the walking stick during her morning walks and feels more balanced when she uses it.    Pertinent History  Increased severity over last 2 weeks    Limitations  Sitting;Standing    How long can you sit comfortably?  10 min    How long can you stand comfortably?  60 min with intermittent support    How long can you walk comfortably?  30 min (limited more by L hip pain and deconditioning)    Diagnostic tests  nerve conduction > 3 years ago    Patient Stated Goals  improve balance    Pain Score  6     Pain Location  Leg    Pain Orientation  Right;Left    Pain Descriptors / Indicators  Pins and needles;Numbness    Pain Type  Chronic pain;Neuropathic pain    Pain Onset  More than a month ago    Pain Onset  More than a month ago       TREATMENT  Pre-treatment assessment: 6/10 pain/discomfort in lower legs  Neuromuscular Re-education: Gastroc stretch for improved elasticity and ability to react to dynamic balance demands 4x30 sec B Soleus stretch for improved elasticity and ability to react to dynamic balance demands 4x30 sec B  Patient instructed in  advanced balance exercise // bars: Toe taps to 4" step x10 SUE support, x20 no UE support 4" step negotiation/up and overs x8 with faded UE support, faded step-to pattern for improved curb negotiation Airex balance beam side steps for improved ankle stability, hip strengthening, and balance strategy application x4 laps Seated hip abduction, RTB, x30 for improved hip strength and reaction ability  Post-treatment assessment: 4/10 pain/discomfort in lower legs  Patient educated throughout session on appropriate technique and form using multi-modal cueing, HEP, and activity modification. Patient articulated understanding and returned demonstration.  Patient Response to interventions: Patient reporting association between decreased symptoms and increased activity.   ASSESSMENT Patient presents to clinic  with excellent motivation to participate in therapy. Patient demonstrates deficits in balance, strength, activity tolerance, and posture as evidenced by increased reliance on UE support for maintaining dynamic balance over obstacles and on compliant surfaces. Patient able to achieve unsupported navigation of obstacles with decreased speed during today's session and responded positively to neuromuscular re-education interventions. Patient will benefit from continued skilled therapeutic intervention to address remaining deficits in balance, strength, activity tolerance, and posture in order to decrease risk of falls, increase function, and improve overall QOL.    PT Short Term Goals - 11/05/18 1013      PT SHORT TERM GOAL #1   Title  Pt will be independent with HEP in order to improve strength and balance in order to decrease fall risk and improve function at home and work.    Baseline  Not initiated    Time  2    Period  Weeks    Status  New    Target Date  11/19/18        PT Long Term Goals - 11/05/18 1014      PT LONG TERM GOAL #1   Title  Patient will improve BERG by at least 3 points in order to demonstrate clinically significant improvement in balance.      Baseline  IE: 41/56    Time  4    Period  Weeks    Status  New    Target Date  12/03/18      PT LONG TERM GOAL #2   Title  Patient will decrease 5TSTS by at least 3 seconds in order to demonstrate clinically significant improvement in LE strength and decrease risk of falls.    Baseline  IE: 17 s, BUE on thighs    Time  4    Period  Weeks    Status  New    Target Date  12/03/18      PT LONG TERM GOAL #3   Title  Patient will report intermittent L hip pain to be 3/10 or less on NPRS to demonstrate clinically significant improvement in pain and improve overall function.     Baseline  IE: constant 5/10    Time  4    Period  Weeks    Status  New    Target Date  12/03/18            Plan - 11/25/18 0953    Clinical  Impression Statement  Patient presents to clinic with excellent motivation to participate in therapy. Patient demonstrates deficits in balance, strength, activity tolerance, and posture as evidenced by increased reliance on UE support for maintaining dynamic balance over obstacles and on compliant surfaces. Patient able to achieve unsupported navigation of obstacles with decreased speed during today's session and responded positively to neuromuscular re-education interventions. Patient will benefit from  continued skilled therapeutic intervention to address remaining deficits in balance, strength, activity tolerance, and posture in order to decrease risk of falls, increase function, and improve overall QOL.    Personal Factors and Comorbidities  Comorbidity 3+;Past/Current Experience;Fitness;Behavior Pattern;Age    Comorbidities  restless leg syndrome; lymphedema; hyperlipidemia; HTN; OA    Examination-Activity Limitations  Bed Mobility;Lift;Bend;Sleep;Sit;Transfers;Reach Overhead;Stairs    Examination-Participation Restrictions  Cleaning;Laundry;Yard Work;Other;Community Activity;Driving    Stability/Clinical Decision Making  Evolving/Moderate complexity    Rehab Potential  Fair    PT Frequency  2x / week    PT Duration  4 weeks    PT Treatment/Interventions  ADLs/Self Care Home Management;Moist Heat;Stair training;Gait training;Functional mobility training;Neuromuscular re-education;Balance training;Therapeutic exercise;Therapeutic activities;Patient/family education;Manual techniques;Taping;Dry needling;Cryotherapy    PT Next Visit Plan  L hip distraction; B glute strengthening, balance    PT Home Exercise Plan  Access Code: [email or web address]    Consulted and Agree with Plan of Care  Patient       Patient will benefit from skilled therapeutic intervention in order to improve the following deficits and impairments:  Abnormal gait, Impaired sensation, Improper body mechanics, Pain, Decreased mobility,  Postural dysfunction, Decreased activity tolerance, Decreased endurance, Decreased range of motion, Decreased strength, Decreased balance, Difficulty walking, Increased edema, Obesity  Visit Diagnosis: Muscle weakness (generalized)  Difficulty in walking, not elsewhere classified  Chronic left hip pain  Unsteadiness on feet     Problem List Patient Active Problem List   Diagnosis Date Noted  . Tachycardia 03/01/2018  . Benign breast lumps 11/26/2017  . Cancer ([place]) 11/26/2017  . Depression 11/26/2017  . Hyperlipidemia 11/26/2017  . Hypertension 11/26/2017  . Osteoporosis, post-menopausal 11/26/2017  . Arthritis 03/26/2017  . Neuropathy 03/26/2017  . Leg pain, bilateral 03/26/2017  . Achilles tendinosis 03/26/2017  . RLS (restless legs syndrome) 03/26/2017  . Chronic deep vein thrombosis (DVT) of proximal vein of lower extremity ([place]) 03/26/2017  . Other insomnia 01/02/2017  . Chills without fever 12/26/2016  . Chronic pain of left heel 12/26/2016  . Increased band cell count 12/26/2016  . Vitamin D deficiency 11/14/2016  . Weight gain with edema 11/14/2016  . Other headache syndrome 11/07/2016  . Hyponatremia 10/21/2016  . Prediabetes 08/08/2016  . Irritable bowel syndrome with constipation and diarrhea 01/20/2015  . Obesity, morbid ([place]) 01/20/2015  . Adult BMI 35.0-35.9 kg/sq m 12/24/2014  . Obstructive sleep apnea syndrome 12/03/2014  . Multiple joint pain 07/22/2014  . BRCA negative 01/20/2014  . Malignant neoplasm of right female breast [facility]) 12/15/2013   Myles Gip PT, DPT [phone] 11/25/2018, 10:10 AM  [facility] [address] [place], [place], [address] Phone: [phone]   Fax:  [phone]  Name: Karen [name] MRN: [id number] Date of Birth: September 14, 1950

## 2018-11-26 ENCOUNTER — Ambulatory Visit
Admission: RE | Admit: 2018-11-26 | Discharge: 2018-11-26 | Disposition: A | Discharge status: Home / Self Care | Payer: Medicare Other | Source: Ambulatory Visit | Attending: Cardiology | Admitting: Cardiology

## 2018-11-26 ENCOUNTER — Other Ambulatory Visit: Payer: Self-pay

## 2018-11-26 ENCOUNTER — Other Ambulatory Visit: Payer: Self-pay | Admitting: Cardiology

## 2018-11-26 DIAGNOSIS — R600 Localized edema: Secondary | ICD-10-CM | POA: Insufficient documentation

## 2018-11-27 ENCOUNTER — Ambulatory Visit: Payer: Medicare Other | Admitting: Physical Therapy

## 2018-11-27 DIAGNOSIS — G8929 Other chronic pain: Secondary | ICD-10-CM

## 2018-11-27 DIAGNOSIS — R2681 Unsteadiness on feet: Secondary | ICD-10-CM

## 2018-11-27 DIAGNOSIS — R262 Difficulty in walking, not elsewhere classified: Secondary | ICD-10-CM

## 2018-11-27 DIAGNOSIS — M25552 Pain in left hip: Secondary | ICD-10-CM

## 2018-11-27 DIAGNOSIS — M6281 Muscle weakness (generalized): Secondary | ICD-10-CM | POA: Diagnosis not present

## 2018-11-27 NOTE — Therapy (Signed)
[facility] [address] [place], [place], [address] Phone: [phone]   Fax:  [phone]  Physical Therapy Treatment  Patient Details  Name: Karen [name] MRN: [id number] Date of Birth: [birth year] Referring Provider (PT): Jennings Books   Encounter Date: 11/27/2018  PT End of Session - 11/27/18 0815    Visit Number  7    Number of Visits  10    Date for PT Re-Evaluation  12/03/18    Authorization Type  7/10 (Medicare)    PT Start Time  0800    PT Stop Time  0833    PT Time Calculation (min)  33 min    Equipment Utilized During Treatment  Gait belt    Activity Tolerance  Patient tolerated treatment well;Other (comment)   Patient emotionally exhausted by medical appointments and changes to care this week.    Behavior During Therapy  [facility] for tasks assessed/performed       Past Medical History:  Diagnosis Date  . Acquired trigger finger of both middle fingers   . Arthritis   . Breast cancer, right ([place]) 2011   Right Lumpectomy and Rad tx's.  . Depression   . GERD (gastroesophageal reflux disease)   . Hyperlipidemia   . Hypertension   . Hypothyroidism   . Narcolepsy and cataplexy   . Neuropathy   . Osteoporosis, post-menopausal   . RLS (restless legs syndrome)   . Sleep apnea     Past Surgical History:  Procedure Laterality Date  . ABDOMINAL HYSTERECTOMY    . APPENDECTOMY    . BREAST SURGERY    . CARDIAC CATHETERIZATION N/A 11/25/2014   Procedure: Left Heart Cath;  Surgeon: Teodoro Spray, MD;  Location: [place] [facility];  Service: Cardiovascular;  Laterality: N/A;  . CHOLECYSTECTOMY    . COLONOSCOPY WITH PROPOFOL N/A 08/05/2018   Procedure: COLONOSCOPY WITH PROPOFOL;  Surgeon: Lollie Sails, MD;  Location: [facility];  Service: Endoscopy;  Laterality: N/A;  . MASTECTOMY Right    partial/lumpectomy    There were no vitals filed for this visit.  Subjective Assessment - 11/27/18 0810    Subjective   Patient had appointment with cardiology yesterday and had [place] to rule out DVT. Results are negative. BP medicattion was adjusted to decrease leg swelling. Diuretic was prescirbed to help clear fluid (not long term intervention). Echocardiagram was scheduled for next week for further information. Follow up with [facility] in July. Patient expressing a lot of overwhelm from medical appointments and information. Patient requesting time to vent and process.    Pertinent History  Increased severity over last 2 weeks    Limitations  Sitting;Standing    How long can you sit comfortably?  10 min    How long can you stand comfortably?  60 min with intermittent support    How long can you walk comfortably?  30 min (limited more by L hip pain and deconditioning)    Diagnostic tests  nerve conduction > 3 years ago    Patient Stated Goals  improve balance    Currently in Pain?  Yes    Pain Score  4     Pain Location  Leg    Pain Orientation  Right;Left    Pain Type  Neuropathic pain;Chronic pain    Pain Onset  More than a month ago    Pain Onset  More than a month ago       TREATMENT  Self-Care/Home Management :  Patient's HEP reviewed. Patient education and collaboration on manageable HEP and ways to incorporate into daily routines for improved adherence. Patient counseled on options for maintaining activity in the presence of fluctuating temperatures/humidity.     ASSESSMENT Patient presents to clinic with excellent motivation to participate in therapy despite increased overwhelm/emotional exhaustion from increased medical appointments/procedures this week and next. Patient able to collaborate and plan for incorporating HEP into daily routine during today's session and responded positively to educational and self-care interventions. Patient will benefit from continued skilled therapeutic intervention to address remaining deficits in balance, strength, mobility in order to decrease risk of falls,  increase function, and improve overall QOL.       PT Short Term Goals - 11/05/18 1013      PT SHORT TERM GOAL #1   Title  Pt will be independent with HEP in order to improve strength and balance in order to decrease fall risk and improve function at home and work.    Baseline  Not initiated    Time  2    Period  Weeks    Status  New    Target Date  11/19/18        PT Long Term Goals - 11/05/18 1014      PT LONG TERM GOAL #1   Title  Patient will improve BERG by at least 3 points in order to demonstrate clinically significant improvement in balance.      Baseline  IE: 41/56    Time  4    Period  Weeks    Status  New    Target Date  12/03/18      PT LONG TERM GOAL #2   Title  Patient will decrease 5TSTS by at least 3 seconds in order to demonstrate clinically significant improvement in LE strength and decrease risk of falls.    Baseline  IE: 17 s, BUE on thighs    Time  4    Period  Weeks    Status  New    Target Date  12/03/18      PT LONG TERM GOAL #3   Title  Patient will report intermittent L hip pain to be 3/10 or less on NPRS to demonstrate clinically significant improvement in pain and improve overall function.     Baseline  IE: constant 5/10    Time  4    Period  Weeks    Status  New    Target Date  12/03/18            Plan - 11/27/18 6222    Clinical Impression Statement  Patient presents to clinic with excellent motivation to participate in therapy despite increased overwhelm/emotional exhaustion from increased medical appointments/procedures this week and next. Patient able to collaborate and plan for incorporating HEP into daily routine during today's session and responded positively to educational and self-care interventions. Patient will benefit from continued skilled therapeutic intervention to address remaining deficits in balance, strength, mobility in order to decrease risk of falls, increase function, and improve overall QOL.    Personal Factors  and Comorbidities  Comorbidity 3+;Past/Current Experience;Fitness;Behavior Pattern;Age    Comorbidities  restless leg syndrome; lymphedema; hyperlipidemia; HTN; OA    Examination-Activity Limitations  Bed Mobility;Lift;Bend;Sleep;Sit;Transfers;Reach Overhead;Stairs    Examination-Participation Restrictions  Cleaning;Laundry;Yard Work;Other;Community Activity;Driving    Stability/Clinical Decision Making  Evolving/Moderate complexity    Rehab Potential  Fair    PT Frequency  2x / week    PT Duration  4 weeks  PT Treatment/Interventions  ADLs/Self Care Home Management;Moist Heat;Stair training;Gait training;Functional mobility training;Neuromuscular re-education;Balance training;Therapeutic exercise;Therapeutic activities;Patient/family education;Manual techniques;Taping;Dry needling;Cryotherapy    PT Next Visit Plan  L hip distraction; B glute strengthening, balance    PT Home Exercise Plan  Access Code: [email or web address]    Consulted and Agree with Plan of Care  Patient       Patient will benefit from skilled therapeutic intervention in order to improve the following deficits and impairments:  Abnormal gait, Impaired sensation, Improper body mechanics, Pain, Decreased mobility, Postural dysfunction, Decreased activity tolerance, Decreased endurance, Decreased range of motion, Decreased strength, Decreased balance, Difficulty walking, Increased edema, Obesity  Visit Diagnosis: Muscle weakness (generalized)  Difficulty in walking, not elsewhere classified  Chronic left hip pain  Unsteadiness on feet     Problem List Patient Active Problem List   Diagnosis Date Noted  . Tachycardia 03/01/2018  . Benign breast lumps 11/26/2017  . Cancer ([place]) 11/26/2017  . Depression 11/26/2017  . Hyperlipidemia 11/26/2017  . Hypertension 11/26/2017  . Osteoporosis, post-menopausal 11/26/2017  . Arthritis 03/26/2017  . Neuropathy 03/26/2017  . Leg pain, bilateral 03/26/2017  . Achilles tendinosis  03/26/2017  . RLS (restless legs syndrome) 03/26/2017  . Chronic deep vein thrombosis (DVT) of proximal vein of lower extremity ([place]) 03/26/2017  . Other insomnia 01/02/2017  . Chills without fever 12/26/2016  . Chronic pain of left heel 12/26/2016  . Increased band cell count 12/26/2016  . Vitamin D deficiency 11/14/2016  . Weight gain with edema 11/14/2016  . Other headache syndrome 11/07/2016  . Hyponatremia 10/21/2016  . Prediabetes 08/08/2016  . Irritable bowel syndrome with constipation and diarrhea 01/20/2015  . Obesity, morbid ([place]) 01/20/2015  . Adult BMI 35.0-35.9 kg/sq m 12/24/2014  . Obstructive sleep apnea syndrome 12/03/2014  . Multiple joint pain 07/22/2014  . BRCA negative 01/20/2014  . Malignant neoplasm of right female breast [facility]) 12/15/2013   Myles Gip PT, DPT [phone] 11/27/2018, 8:45 AM  [facility] [address] [place], [place], [address] Phone: [phone]   Fax:  [phone]  Name: Karen [name] MRN: [id number] Date of Birth: 04/28/1951

## 2018-12-02 ENCOUNTER — Ambulatory Visit: Payer: Medicare Other | Admitting: Physical Therapy

## 2018-12-04 ENCOUNTER — Encounter: Payer: Medicare Other | Admitting: Physical Therapy

## 2018-12-09 ENCOUNTER — Ambulatory Visit: Payer: Medicare Other | Admitting: Physical Therapy

## 2018-12-09 ENCOUNTER — Encounter: Payer: Self-pay | Admitting: Physical Therapy

## 2018-12-09 ENCOUNTER — Other Ambulatory Visit: Payer: Self-pay

## 2018-12-09 ENCOUNTER — Other Ambulatory Visit: Payer: Self-pay | Admitting: Neurology

## 2018-12-09 DIAGNOSIS — M25552 Pain in left hip: Secondary | ICD-10-CM

## 2018-12-09 DIAGNOSIS — M6281 Muscle weakness (generalized): Secondary | ICD-10-CM

## 2018-12-09 DIAGNOSIS — G8929 Other chronic pain: Secondary | ICD-10-CM

## 2018-12-09 DIAGNOSIS — R200 Anesthesia of skin: Secondary | ICD-10-CM

## 2018-12-09 DIAGNOSIS — R202 Paresthesia of skin: Secondary | ICD-10-CM

## 2018-12-09 DIAGNOSIS — R262 Difficulty in walking, not elsewhere classified: Secondary | ICD-10-CM

## 2018-12-09 DIAGNOSIS — R2681 Unsteadiness on feet: Secondary | ICD-10-CM

## 2018-12-09 NOTE — Therapy (Signed)
[facility] [address]. [place], [place], [address] Phone: [phone]   Fax:  [phone]  Physical Therapy Treatment  Patient Details  Name: Karen [name] MRN: [id number] Date of Birth: 06-Feb-1951 Referring Provider (PT): Jennings Books   Encounter Date: 12/09/2018  PT End of Session - 12/09/18 0800    Visit Number  8    Number of Visits  10    Date for PT Re-Evaluation  12/03/18    Authorization Type  8/10 (Medicare)    PT Start Time  0800    PT Stop Time  0855    PT Time Calculation (min)  55 min    Equipment Utilized During Treatment  Gait belt    Activity Tolerance  Patient tolerated treatment well    Behavior During Therapy  [facility] for tasks assessed/performed       Past Medical History:  Diagnosis Date  . Acquired trigger finger of both middle fingers   . Arthritis   . Breast cancer, right ([place]) 2011   Right Lumpectomy and Rad tx's.  . Depression   . GERD (gastroesophageal reflux disease)   . Hyperlipidemia   . Hypertension   . Hypothyroidism   . Narcolepsy and cataplexy   . Neuropathy   . Osteoporosis, post-menopausal   . RLS (restless legs syndrome)   . Sleep apnea     Past Surgical History:  Procedure Laterality Date  . ABDOMINAL HYSTERECTOMY    . APPENDECTOMY    . BREAST SURGERY    . CARDIAC CATHETERIZATION N/A 11/25/2014   Procedure: Left Heart Cath;  Surgeon: Teodoro Spray, MD;  Location: [place] [facility];  Service: Cardiovascular;  Laterality: N/A;  . CHOLECYSTECTOMY    . COLONOSCOPY WITH PROPOFOL N/A 08/05/2018   Procedure: COLONOSCOPY WITH PROPOFOL;  Surgeon: Lollie Sails, MD;  Location: [facility] ENDOSCOPY;  Service: Endoscopy;  Laterality: N/A;  . MASTECTOMY Right    partial/lumpectomy    There were no vitals filed for this visit.  Subjective Assessment - 12/09/18 0803    Subjective  Patient had an echocardiogram last week which came back normal. She had a follow up with her  neurologist and has been referred for MRI to check for spinal stenosis, a pain specialist, and was prescribed deloxytine. Patient reports only being able to sleep 2 hrs before waking from lower leg pain.    Pertinent History  Increased severity over last 2 weeks    Limitations  Sitting;Standing    How long can you sit comfortably?  10 min    How long can you stand comfortably?  60 min with intermittent support    How long can you walk comfortably?  30 min (limited more by L hip pain and deconditioning)    Diagnostic tests  nerve conduction > 3 years ago    Patient Stated Goals  improve balance    Currently in Pain?  Yes    Pain Score  7     Pain Location  Leg    Pain Orientation  Right;Left    Pain Descriptors / Indicators  Burning;Tingling;Tightness    Pain Type  Chronic pain;Neuropathic pain    Pain Onset  More than a month ago    Pain Onset  More than a month ago       Goals Reassessed:  [facility] PT Assessment - 12/09/18 0001      Standardized Balance Assessment   Five times sit to stand comments   15.3  sec      Berg Balance Test   Sit to Stand  Able to stand without using hands and stabilize independently    Standing Unsupported  Able to stand safely 2 minutes    Sitting with Back Unsupported but Feet Supported on Floor or Stool  Able to sit safely and securely 2 minutes    Stand to Sit  Sits safely with minimal use of hands    Transfers  Able to transfer safely, minor use of hands    Standing Unsupported with Eyes Closed  Able to stand 10 seconds safely    Standing Unsupported with Feet Together  Able to place feet together independently and stand 1 minute safely    From Standing, Reach Forward with Outstretched Arm  Can reach confidently >25 cm (10")    From Standing Position, Pick up Object from Floor  Able to pick up shoe safely and easily    From Standing Position, Turn to Look Behind Over each Shoulder  Looks behind from both sides and weight shifts well    Turn 360 Degrees   Able to turn 360 degrees safely one side only in 4 seconds or less    Standing Unsupported, Alternately Place Feet on Step/Stool  Able to stand independently and complete 8 steps >20 seconds    Standing Unsupported, One Foot in Front  Able to place foot tandem independently and hold 30 seconds    Standing on One Leg  Able to lift leg independently and hold 5-10 seconds    Total Score  53        TREATMENT  Pre-treatment assessment: L iliac crest higher than R during gait observation with increased TTP at L ASIS (10/10). Leg length discrepancy: R 89 cm, L 93 cm  Manual Therapy: L hip distraction, 2 x 3 min oscillations  L hip flexor MET with passive lengthening 10 posterior pelvic tilts x 5 sec: 20 sec end range hip flexor stretch  Neuromuscular Re-education: Reassessed goals and education on difference btwn neuropathy and radiculopathy as well as spinal stenosis.  Shoe lift provided for R shoe; education on graded activity with shoe lift. Gait in hallway with shoe lift to assess balance and posture. Supine hip flexor stretch 4 x 30 sec  Post-treatment assessment: L ASIS TTP decreased  Patient educated throughout session on appropriate technique and form using multi-modal cueing, HEP, and activity modification. Patient articulated understanding and returned demonstration.   ASSESSMENT Patient presents to clinic with excellent motivation to participate in therapy. Patient demonstrates deficits in dynamic balance, strength, neural tension, and posture as evidenced by increased reliance on UE support for maintaining dynamic balance with SLS, use of arms for STS, (+) L SLR and (+) Well- Leg Test on L (indicative of L sided radiculopathy), and increased LLE limb length (93 cm vs. 89 cm on R). Patient able to achieve decreased L anterior hip pain during today's session and responded positively to neuromuscular re-education and manual interventions. Patient has made marked improvements in balance  as demonstrated by a 11 point gain on Berg Balance (53/56). Patient's condition has the potential to improve further in response to therapy. Maximum improvement is yet to be obtained. The anticipated improvement is attainable and reasonable in a generally predictable time. Patient will benefit from continued skilled therapeutic intervention to address remaining deficits in balance, strength, activity tolerance, and posture in order to decrease risk of falls, increase function, and improve overall QOL.    PT Short Term Goals -  12/09/18 1333      PT SHORT TERM GOAL #1   Title  Pt will be independent with HEP in order to improve strength and balance in order to decrease fall risk and improve function at home and work.    Baseline  Not initiated    Time  2    Period  Weeks    Status  Achieved    Target Date  11/19/18        PT Long Term Goals - 12/09/18 3825      PT LONG TERM GOAL #1   Title  Patient will improve BERG by at least 3 points in order to demonstrate clinically significant improvement in balance.      Baseline  IE: 41/56 12/09/2018: 53/56    Time  4    Period  Weeks    Status  --      PT LONG TERM GOAL #2   Title  Patient will decrease 5TSTS by at least 3 seconds in order to demonstrate clinically significant improvement in LE strength and decrease risk of falls.    Baseline  IE: 17 s, BUE on thighs; 12/09/2018: 15.3 sec, intermittent UE support on thighs    Time  4    Period  Weeks    Status  Partially Met    Target Date  01/06/19      PT LONG TERM GOAL #3   Title  Patient will report intermittent L hip pain to be 3/10 or less on NPRS to demonstrate clinically significant improvement in pain and improve overall function.     Baseline  IE: constant 5/10; 12/09/2018: 5/10    Time  4    Period  Weeks    Status  On-going    Target Date  01/06/19      PT LONG TERM GOAL #4   Title  Patient will demonstrate improved L hip ROM and decreased nerual tension as evidenced by L  SLR > 55 degrees before symptom onset for improved quality of sleep.    Baseline  12/09/2018: 44 degrees    Time  4    Period  Weeks    Status  New    Target Date  01/06/19            Plan - 12/09/18 0855    Clinical Impression Statement  Patient presents to clinic with excellent motivation to participate in therapy. Patient demonstrates deficits in dynamic balance, strength, neural tension, and posture as evidenced by increased reliance on UE support for maintaining dynamic balance with SLS, use of arms for STS, (+) L SLR and (+) Well- Leg Test on L (indicative of L sided radiculopathy), and increased LLE limb length (93 cm vs. 89 cm on R). Patient able to achieve decreased L anterior hip pain during today's session and responded positively to neuromuscular re-education and manual interventions. Patient has made marked improvements in balance as demonstrated by a 11 point gain on Berg Balance (53/56). Patient's condition has the potential to improve further in response to therapy. Maximum improvement is yet to be obtained. The anticipated improvement is attainable and reasonable in a generally predictable time. Patient will benefit from continued skilled therapeutic intervention to address remaining deficits in balance, strength, activity tolerance, and posture in order to decrease risk of falls, increase function, and improve overall QOL.    Personal Factors and Comorbidities  Comorbidity 3+;Past/Current Experience;Fitness;Behavior Pattern;Age    Comorbidities  restless leg syndrome; lymphedema; hyperlipidemia; HTN; OA  Examination-Activity Limitations  Bed Mobility;Lift;Bend;Sleep;Sit;Transfers;Reach Overhead;Stairs    Examination-Participation Restrictions  Cleaning;Laundry;Yard Work;Other;Community Activity;Driving    Stability/Clinical Decision Making  Evolving/Moderate complexity    Rehab Potential  Fair    PT Frequency  2x / week    PT Duration  4 weeks    PT Treatment/Interventions   ADLs/Self Care Home Management;Moist Heat;Stair training;Gait training;Functional mobility training;Neuromuscular re-education;Balance training;Therapeutic exercise;Therapeutic activities;Patient/family education;Manual techniques;Taping;Dry needling;Cryotherapy    PT Next Visit Plan  L hip distraction; B glute strengthening, balance    PT Home Exercise Plan  L hip flexor stretch, gentle posterior pelvic tilts    Consulted and Agree with Plan of Care  Patient       Patient will benefit from skilled therapeutic intervention in order to improve the following deficits and impairments:  Abnormal gait, Impaired sensation, Improper body mechanics, Pain, Decreased mobility, Postural dysfunction, Decreased activity tolerance, Decreased endurance, Decreased range of motion, Decreased strength, Decreased balance, Difficulty walking, Increased edema, Obesity, Increased muscle spasms, Impaired tone, Impaired flexibility  Visit Diagnosis: 1. Difficulty in walking, not elsewhere classified   2. Muscle weakness (generalized)   3. Chronic left hip pain   4. Unsteadiness on feet        Problem List Patient Active Problem List   Diagnosis Date Noted  . Tachycardia 03/01/2018  . Benign breast lumps 11/26/2017  . Cancer ([place]) 11/26/2017  . Depression 11/26/2017  . Hyperlipidemia 11/26/2017  . Hypertension 11/26/2017  . Osteoporosis, post-menopausal 11/26/2017  . Arthritis 03/26/2017  . Neuropathy 03/26/2017  . Leg pain, bilateral 03/26/2017  . Achilles tendinosis 03/26/2017  . RLS (restless legs syndrome) 03/26/2017  . Chronic deep vein thrombosis (DVT) of proximal vein of lower extremity ([place]) 03/26/2017  . Other insomnia 01/02/2017  . Chills without fever 12/26/2016  . Chronic pain of left heel 12/26/2016  . Increased band cell count 12/26/2016  . Vitamin D deficiency 11/14/2016  . Weight gain with edema 11/14/2016  . Other headache syndrome 11/07/2016  . Hyponatremia 10/21/2016  .  Prediabetes 08/08/2016  . Irritable bowel syndrome with constipation and diarrhea 01/20/2015  . Obesity, morbid ([place]) 01/20/2015  . Adult BMI 35.0-35.9 kg/sq m 12/24/2014  . Obstructive sleep apnea syndrome 12/03/2014  . Multiple joint pain 07/22/2014  . BRCA negative 01/20/2014  . Malignant neoplasm of right female breast [facility]) 12/15/2013   Myles Gip PT, DPT [phone] 12/09/2018, 1:55 PM  [facility] [address] [place], [place], [address] Phone: [phone]   Fax:  [phone]  Name: Karen [name] MRN: [id number] Date of Birth: 1950-10-30

## 2018-12-11 ENCOUNTER — Other Ambulatory Visit: Payer: Self-pay

## 2018-12-11 ENCOUNTER — Ambulatory Visit: Payer: Medicare Other | Admitting: Physical Therapy

## 2018-12-11 ENCOUNTER — Encounter: Payer: Self-pay | Admitting: Physical Therapy

## 2018-12-11 DIAGNOSIS — M6281 Muscle weakness (generalized): Secondary | ICD-10-CM

## 2018-12-11 DIAGNOSIS — G8929 Other chronic pain: Secondary | ICD-10-CM

## 2018-12-11 DIAGNOSIS — R2681 Unsteadiness on feet: Secondary | ICD-10-CM

## 2018-12-11 DIAGNOSIS — R262 Difficulty in walking, not elsewhere classified: Secondary | ICD-10-CM

## 2018-12-11 NOTE — Therapy (Signed)
[place] [address] [place], [place], [address] Phone: [phone]   Fax:  [phone]  Physical Therapy Treatment  Patient Details  Name: Karen [name] MRN: [id number] Date of Birth: 23-Jan-1951 Referring Provider (PT): Jennings Books   Encounter Date: 12/11/2018  PT End of Session - 12/11/18 1000    Visit Number  9    Number of Visits  18    Date for PT Re-Evaluation  01/07/19    Authorization Type  9/10 (Medicare)    PT Start Time  0758    PT Stop Time  0840    PT Time Calculation (min)  42 min    Equipment Utilized During Treatment  Gait belt    Activity Tolerance  Patient tolerated treatment well;Patient limited by pain    Behavior During Therapy  [facility] for tasks assessed/performed       Past Medical History:  Diagnosis Date  . Acquired trigger finger of both middle fingers   . Arthritis   . Breast cancer, right ([place]) 2011   Right Lumpectomy and Rad tx's.  . Depression   . GERD (gastroesophageal reflux disease)   . Hyperlipidemia   . Hypertension   . Hypothyroidism   . Narcolepsy and cataplexy   . Neuropathy   . Osteoporosis, post-menopausal   . RLS (restless legs syndrome)   . Sleep apnea     Past Surgical History:  Procedure Laterality Date  . ABDOMINAL HYSTERECTOMY    . APPENDECTOMY    . BREAST SURGERY    . CARDIAC CATHETERIZATION N/A 11/25/2014   Procedure: Left Heart Cath;  Surgeon: Teodoro Spray, MD;  Location: [facility];  Service: Cardiovascular;  Laterality: N/A;  . CHOLECYSTECTOMY    . COLONOSCOPY WITH PROPOFOL N/A 08/05/2018   Procedure: COLONOSCOPY WITH PROPOFOL;  Surgeon: Lollie Sails, MD;  Location: [facility] ENDOSCOPY;  Service: Endoscopy;  Laterality: N/A;  . MASTECTOMY Right    partial/lumpectomy    There were no vitals filed for this visit.  Subjective Assessment - 12/11/18 0801    Subjective  Patient states that she is fair today. She is having more swelling today. She  notes more ankle pain which she attributes to the heel lift. Patient reports her hip pain is a little better; the numbness and tingling has also decreased.    Pertinent History  Increased severity over last 2 weeks    Limitations  Sitting;Standing    How long can you sit comfortably?  10 min    How long can you stand comfortably?  60 min with intermittent support    How long can you walk comfortably?  30 min (limited more by L hip pain and deconditioning)    Diagnostic tests  nerve conduction > 3 years ago    Patient Stated Goals  improve balance    Currently in Pain?  Yes    Pain Score  6     Pain Location  Calf    Pain Orientation  Right;Left    Pain Radiating Towards  feet    Pain Onset  More than a month ago    Multiple Pain Sites  Yes    Pain Score  4    Pain Location  Hip    Pain Onset  More than a month ago      TREATMENT  Manual Therapy: L hip distraction, 2 x 3 min oscillations   Neuromuscular Re-education: Hooklying pelvic tilts x10 Sciatic nerve glides x5  LLE, X10 RLE Sciatic nerve glides seated x5 each leg Education on progression of activity with heel lift and using a gentle approach to decrease risk of nerve irritation.  Review of HEP.    Patient educated throughout session on appropriate technique and form using multi-modal cueing, HEP, and activity modification. Patient articulated understanding and returned demonstration.   ASSESSMENT Patient presents to clinic to participate in therapy. Patient demonstrates deficits in dynamic balance, strength, neural tension, and posture. Patient able to achieve decreased L sided radicular symptoms during today's session and responded positively to neuromuscular re-education and manual interventions. Patient will benefit from continued skilled therapeutic intervention to address remaining deficits in balance, strength, activity tolerance, and posture in order to decrease risk of falls, increase function, and improve overall  QOL.      PT Short Term Goals - 12/09/18 1333      PT SHORT TERM GOAL #1   Title  Pt will be independent with HEP in order to improve strength and balance in order to decrease fall risk and improve function at home and work.    Baseline  Not initiated    Time  2    Period  Weeks    Status  Achieved    Target Date  11/19/18        PT Long Term Goals - 12/09/18 3662      PT LONG TERM GOAL #1   Title  Patient will improve BERG by at least 3 points in order to demonstrate clinically significant improvement in balance.      Baseline  IE: 41/56 12/09/2018: 53/56    Time  4    Period  Weeks    Status  --      PT LONG TERM GOAL #2   Title  Patient will decrease 5TSTS by at least 3 seconds in order to demonstrate clinically significant improvement in LE strength and decrease risk of falls.    Baseline  IE: 17 s, BUE on thighs; 12/09/2018: 15.3 sec, intermittent UE support on thighs    Time  4    Period  Weeks    Status  Partially Met    Target Date  01/06/19      PT LONG TERM GOAL #3   Title  Patient will report intermittent L hip pain to be 3/10 or less on NPRS to demonstrate clinically significant improvement in pain and improve overall function.     Baseline  IE: constant 5/10; 12/09/2018: 5/10    Time  4    Period  Weeks    Status  On-going    Target Date  01/06/19      PT LONG TERM GOAL #4   Title  Patient will demonstrate improved L hip ROM and decreased nerual tension as evidenced by L SLR > 55 degrees before symptom onset for improved quality of sleep.    Baseline  12/09/2018: 44 degrees    Time  4    Period  Weeks    Status  New    Target Date  01/06/19            Plan - 12/11/18 1616    Clinical Impression Statement  Patient presents to clinic to participate in therapy. Patient demonstrates deficits in dynamic balance, strength, neural tension, and posture. Patient able to achieve decreased L sided radicular symptoms during today's session and responded  positively to neuromuscular re-education and manual interventions. Patient will benefit from continued skilled therapeutic intervention to address remaining deficits  in balance, strength, activity tolerance, and posture in order to decrease risk of falls, increase function, and improve overall QOL.    Personal Factors and Comorbidities  Comorbidity 3+;Past/Current Experience;Fitness;Behavior Pattern;Age    Comorbidities  restless leg syndrome; lymphedema; hyperlipidemia; HTN; OA    Examination-Activity Limitations  Bed Mobility;Lift;Bend;Sleep;Sit;Transfers;Reach Overhead;Stairs    Examination-Participation Restrictions  Cleaning;Laundry;Yard Work;Other;Community Activity;Driving    Stability/Clinical Decision Making  Evolving/Moderate complexity    Rehab Potential  Fair    PT Frequency  2x / week    PT Duration  4 weeks    PT Treatment/Interventions  ADLs/Self Care Home Management;Moist Heat;Stair training;Gait training;Functional mobility training;Neuromuscular re-education;Balance training;Therapeutic exercise;Therapeutic activities;Patient/family education;Manual techniques;Taping;Dry needling;Cryotherapy    PT Next Visit Plan  L hip distraction; B glute strengthening, balance    PT Home Exercise Plan  L hip flexor stretch, gentle posterior pelvic tilts    Consulted and Agree with Plan of Care  Patient       Patient will benefit from skilled therapeutic intervention in order to improve the following deficits and impairments:  Abnormal gait, Impaired sensation, Improper body mechanics, Pain, Decreased mobility, Postural dysfunction, Decreased activity tolerance, Decreased endurance, Decreased range of motion, Decreased strength, Decreased balance, Difficulty walking, Increased edema, Obesity, Increased muscle spasms, Impaired tone, Impaired flexibility  Visit Diagnosis: 1. Difficulty in walking, not elsewhere classified   2. Muscle weakness (generalized)   3. Chronic left hip pain   4.  Unsteadiness on feet        Problem List Patient Active Problem List   Diagnosis Date Noted  . Tachycardia 03/01/2018  . Benign breast lumps 11/26/2017  . Cancer ([place]) 11/26/2017  . Depression 11/26/2017  . Hyperlipidemia 11/26/2017  . Hypertension 11/26/2017  . Osteoporosis, post-menopausal 11/26/2017  . Arthritis 03/26/2017  . Neuropathy 03/26/2017  . Leg pain, bilateral 03/26/2017  . Achilles tendinosis 03/26/2017  . RLS (restless legs syndrome) 03/26/2017  . Chronic deep vein thrombosis (DVT) of proximal vein of lower extremity ([place]) 03/26/2017  . Other insomnia 01/02/2017  . Chills without fever 12/26/2016  . Chronic pain of left heel 12/26/2016  . Increased band cell count 12/26/2016  . Vitamin D deficiency 11/14/2016  . Weight gain with edema 11/14/2016  . Other headache syndrome 11/07/2016  . Hyponatremia 10/21/2016  . Prediabetes 08/08/2016  . Irritable bowel syndrome with constipation and diarrhea 01/20/2015  . Obesity, morbid ([place]) 01/20/2015  . Adult BMI 35.0-35.9 kg/sq m 12/24/2014  . Obstructive sleep apnea syndrome 12/03/2014  . Multiple joint pain 07/22/2014  . BRCA negative 01/20/2014  . Malignant neoplasm of right female breast [facility]) 12/15/2013   Myles Gip PT, DPT [phone] 12/11/2018, 4:29 PM  [place] [facility] [address] [place], [place], [address] Phone: [phone]   Fax:  [phone]  Name: TASHI ANDUJO MRN: [id number] Date of Birth: Sep 01, 1950

## 2018-12-17 ENCOUNTER — Encounter: Payer: Self-pay | Admitting: Physical Therapy

## 2018-12-17 ENCOUNTER — Other Ambulatory Visit: Payer: Self-pay

## 2018-12-17 ENCOUNTER — Ambulatory Visit: Payer: Medicare Other | Admitting: Physical Therapy

## 2018-12-17 DIAGNOSIS — M6281 Muscle weakness (generalized): Secondary | ICD-10-CM

## 2018-12-17 DIAGNOSIS — M25552 Pain in left hip: Secondary | ICD-10-CM

## 2018-12-17 DIAGNOSIS — G8929 Other chronic pain: Secondary | ICD-10-CM

## 2018-12-17 DIAGNOSIS — R262 Difficulty in walking, not elsewhere classified: Secondary | ICD-10-CM

## 2018-12-17 DIAGNOSIS — R2681 Unsteadiness on feet: Secondary | ICD-10-CM

## 2018-12-17 NOTE — Therapy (Signed)
[facility] [address] [place], [place], [address] Phone: [phone]   Fax:  [phone]  Physical Therapy Treatment Physical Therapy Progress Note   Dates of reporting period  11/05/2018   to   12/17/2018   Patient Details  Name: Karen [name] MRN: [id number] Date of Birth: [birth year] Referring Provider (PT): Jennings Books   Encounter Date: 12/17/2018  PT End of Session - 12/17/18 0809    Visit Number  10    Number of Visits  18    Date for PT Re-Evaluation  01/07/19    Authorization Type  10/10 (Medicare)    PT Start Time  0758    PT Stop Time  0847    PT Time Calculation (min)  49 min    Equipment Utilized During Treatment  Gait belt    Activity Tolerance  Patient tolerated treatment well;Patient limited by pain    Behavior During Therapy  WFL for tasks assessed/performed       Past Medical History:  Diagnosis Date  . Acquired trigger finger of both middle fingers   . Arthritis   . Breast cancer, right ([place]) 2011   Right Lumpectomy and Rad tx's.  . Depression   . GERD (gastroesophageal reflux disease)   . Hyperlipidemia   . Hypertension   . Hypothyroidism   . Narcolepsy and cataplexy   . Neuropathy   . Osteoporosis, post-menopausal   . RLS (restless legs syndrome)   . Sleep apnea     Past Surgical History:  Procedure Laterality Date  . ABDOMINAL HYSTERECTOMY    . APPENDECTOMY    . BREAST SURGERY    . CARDIAC CATHETERIZATION N/A 11/25/2014   Procedure: Left Heart Cath;  Surgeon: Teodoro Spray, MD;  Location: [place] [facility];  Service: Cardiovascular;  Laterality: N/A;  . CHOLECYSTECTOMY    . COLONOSCOPY WITH PROPOFOL N/A 08/05/2018   Procedure: COLONOSCOPY WITH PROPOFOL;  Surgeon: Lollie Sails, MD;  Location: [facility] ENDOSCOPY;  Service: Endoscopy;  Laterality: N/A;  . MASTECTOMY Right    partial/lumpectomy    There were no vitals filed for this visit.  Subjective Assessment - 12/17/18 0800     Subjective  Patient reports no changes in leg symptoms since last visit. Patient did purchase new shoes to prevent overpronation. Patient continues to have difficulty sleeping (less than 3 hours/night) despite wearing a CPAP. Patient has MRI scheduled for 12/25/2018 to help determine if the pain in the legs is of spine origin. Patient having improved swelling in BLE; pain is bad today.    Pertinent History  Increased severity over last 2 weeks    Limitations  Sitting;Standing    How long can you sit comfortably?  10 min    How long can you stand comfortably?  60 min with intermittent support    How long can you walk comfortably?  30 min (limited more by L hip pain and deconditioning)    Diagnostic tests  nerve conduction > 3 years ago    Patient Stated Goals  improve balance    Currently in Pain?  Yes    Pain Score  7     Pain Location  Leg    Pain Orientation  Left;Right;Lower    Pain Descriptors / Indicators  Numbness    Pain Type  Neuropathic pain    Pain Radiating Towards  feet    Pain Onset  More than a month ago    Pain Frequency  Constant    Pain Onset  More than a month ago      TREATMENT  Pre-treatment assessment: diminished patellar reflexes bilaterally; (-) Clonus bilaterally  Manual Therapy: B hip distraction, 2 x 3 min oscillations each  Neuromuscular Re-education: Hooklying pelvic tilts x10 Hooklying diaphragmatic breathing x20 Hooklying TrA activation with coordinated breath x20 Hooklying TrA activation, supported mini heel slides with coordinated breath x20 Dynamic Gait components in hallway (x4 laps) with most noticeable unsteadiness with vertical head turns  Patient educated throughout session on appropriate technique and form using multi-modal cueing, HEP, and activity modification. Patient articulated understanding and returned demonstration.   ASSESSMENT Patient presents to clinic to participate in therapy. Patient demonstrates deficits in dynamic balance,  strength, neural tension, and posture. Patient able to achieve 15 consecutive coordinated TrA activation during today's session and responded positively to neuromuscular re-education and manual interventions. Patient's condition has the potential to improve in response to therapy. Maximum improvement is yet to be obtained. The anticipated improvement is attainable and reasonable in a generally predictable time. Patient will benefit from continued skilled therapeutic intervention to address remaining deficits in balance, strength, activity tolerance, and posture in order to decrease risk of falls, increase function, and improve overall QOL.      PT Short Term Goals - 12/09/18 1333      PT SHORT TERM GOAL #1   Title  Pt will be independent with HEP in order to improve strength and balance in order to decrease fall risk and improve function at home and work.    Baseline  Not initiated    Time  2    Period  Weeks    Status  Achieved    Target Date  11/19/18        PT Long Term Goals - 12/17/18 1015      PT LONG TERM GOAL #1   Title  Patient will improve BERG by at least 3 points in order to demonstrate clinically significant improvement in balance.      Baseline  IE: 41/56 12/09/2018: 53/56    Time  4    Period  Weeks      PT LONG TERM GOAL #2   Title  Patient will decrease 5TSTS by at least 3 seconds in order to demonstrate clinically significant improvement in LE strength and decrease risk of falls.    Baseline  IE: 17 s, BUE on thighs; 12/09/2018: 15.3 sec, intermittent UE support on thighs    Time  4    Period  Weeks    Status  Partially Met    Target Date  01/08/19      PT LONG TERM GOAL #3   Title  Patient will report intermittent L hip pain to be 3/10 or less on NPRS to demonstrate clinically significant improvement in pain and improve overall function.     Baseline  IE: constant 5/10; 12/09/2018: 5/10    Time  4    Period  Weeks    Status  On-going    Target Date  01/08/19       PT LONG TERM GOAL #4   Title  Patient will demonstrate improved L hip ROM and decreased nerual tension as evidenced by L SLR > 55 degrees before symptom onset for improved quality of sleep.    Baseline  12/09/2018: 44 degrees    Time  4    Period  Weeks    Status  New    Target Date  01/08/19  Plan - 12/17/18 1014    Clinical Impression Statement  Patient presents to clinic to participate in therapy. Patient demonstrates deficits in dynamic balance, strength, neural tension, and posture. Patient able to achieve 15 consecutive coordinated TrA activation during today's session and responded positively to neuromuscular re-education and manual interventions. Patient's condition has the potential to improve in response to therapy. Maximum improvement is yet to be obtained. The anticipated improvement is attainable and reasonable in a generally predictable time. Patient will benefit from continued skilled therapeutic intervention to address remaining deficits in balance, strength, activity tolerance, and posture in order to decrease risk of falls, increase function, and improve overall QOL.    Personal Factors and Comorbidities  Comorbidity 3+;Past/Current Experience;Fitness;Behavior Pattern;Age    Comorbidities  restless leg syndrome; lymphedema; hyperlipidemia; HTN; OA    Examination-Activity Limitations  Bed Mobility;Lift;Bend;Sleep;Sit;Transfers;Reach Overhead;Stairs    Examination-Participation Restrictions  Cleaning;Laundry;Yard Work;Other;Community Activity;Driving    Stability/Clinical Decision Making  Evolving/Moderate complexity    Rehab Potential  Fair    PT Frequency  2x / week    PT Duration  4 weeks    PT Treatment/Interventions  ADLs/Self Care Home Management;Moist Heat;Stair training;Gait training;Functional mobility training;Neuromuscular re-education;Balance training;Therapeutic exercise;Therapeutic activities;Patient/family education;Manual techniques;Taping;Dry  needling;Cryotherapy    PT Next Visit Plan  L hip distraction; B glute strengthening, balance    PT Home Exercise Plan  L hip flexor stretch, gentle posterior pelvic tilts    Consulted and Agree with Plan of Care  Patient       Patient will benefit from skilled therapeutic intervention in order to improve the following deficits and impairments:  Abnormal gait, Impaired sensation, Improper body mechanics, Pain, Decreased mobility, Postural dysfunction, Decreased activity tolerance, Decreased endurance, Decreased range of motion, Decreased strength, Decreased balance, Difficulty walking, Increased edema, Obesity, Increased muscle spasms, Impaired tone, Impaired flexibility  Visit Diagnosis: 1. Difficulty in walking, not elsewhere classified   2. Muscle weakness (generalized)   3. Chronic left hip pain   4. Unsteadiness on feet        Problem List Patient Active Problem List   Diagnosis Date Noted  . Tachycardia 03/01/2018  . Benign breast lumps 11/26/2017  . Cancer ([place]) 11/26/2017  . Depression 11/26/2017  . Hyperlipidemia 11/26/2017  . Hypertension 11/26/2017  . Osteoporosis, post-menopausal 11/26/2017  . Arthritis 03/26/2017  . Neuropathy 03/26/2017  . Leg pain, bilateral 03/26/2017  . Achilles tendinosis 03/26/2017  . RLS (restless legs syndrome) 03/26/2017  . Chronic deep vein thrombosis (DVT) of proximal vein of lower extremity ([place]) 03/26/2017  . Other insomnia 01/02/2017  . Chills without fever 12/26/2016  . Chronic pain of left heel 12/26/2016  . Increased band cell count 12/26/2016  . Vitamin D deficiency 11/14/2016  . Weight gain with edema 11/14/2016  . Other headache syndrome 11/07/2016  . Hyponatremia 10/21/2016  . Prediabetes 08/08/2016  . Irritable bowel syndrome with constipation and diarrhea 01/20/2015  . Obesity, morbid ([place]) 01/20/2015  . Adult BMI 35.0-35.9 kg/sq m 12/24/2014  . Obstructive sleep apnea syndrome 12/03/2014  . Multiple joint pain  07/22/2014  . BRCA negative 01/20/2014  . Malignant neoplasm of right female breast [facility]) 12/15/2013   Myles Gip PT, DPT [phone] 12/17/2018, 10:24 AM  [facility] [address] [place], [place], [address] Phone: [phone]   Fax:  [phone]  Name: Karen [name] MRN: [id number] Date of Birth: 1951/02/08

## 2018-12-24 ENCOUNTER — Other Ambulatory Visit: Payer: Self-pay | Admitting: Neurology

## 2018-12-24 ENCOUNTER — Encounter: Payer: Self-pay | Admitting: Physical Therapy

## 2018-12-24 ENCOUNTER — Ambulatory Visit: Payer: Medicare Other | Attending: Neurology | Admitting: Physical Therapy

## 2018-12-24 ENCOUNTER — Other Ambulatory Visit: Payer: Self-pay

## 2018-12-24 DIAGNOSIS — M6281 Muscle weakness (generalized): Secondary | ICD-10-CM | POA: Insufficient documentation

## 2018-12-24 DIAGNOSIS — R262 Difficulty in walking, not elsewhere classified: Secondary | ICD-10-CM | POA: Insufficient documentation

## 2018-12-24 DIAGNOSIS — R200 Anesthesia of skin: Secondary | ICD-10-CM

## 2018-12-24 DIAGNOSIS — R2681 Unsteadiness on feet: Secondary | ICD-10-CM | POA: Diagnosis present

## 2018-12-24 DIAGNOSIS — M25552 Pain in left hip: Secondary | ICD-10-CM | POA: Diagnosis present

## 2018-12-24 DIAGNOSIS — G8929 Other chronic pain: Secondary | ICD-10-CM | POA: Diagnosis present

## 2018-12-24 DIAGNOSIS — R202 Paresthesia of skin: Secondary | ICD-10-CM

## 2018-12-24 NOTE — Therapy (Signed)
[facility] [address] [place], [place], [address] Phone: [phone]   Fax:  [phone]  Physical Therapy Treatment  Patient Details  Name: Karen [name] MRN: [id number] Date of Birth: [birth year] Referring Provider (PT): Jennings Books   Encounter Date: 12/24/2018  PT End of Session - 12/24/18 0759    Visit Number  11    Number of Visits  18    Date for PT Re-Evaluation  01/07/19    Authorization Type  1/10 (Medicare)    PT Start Time  0800    PT Stop Time  0840    PT Time Calculation (min)  40 min    Equipment Utilized During Treatment  Gait belt    Activity Tolerance  Patient tolerated treatment well;Patient limited by pain    Behavior During Therapy  [facility] for tasks assessed/performed       Past Medical History:  Diagnosis Date  . Acquired trigger finger of both middle fingers   . Arthritis   . Breast cancer, right ([place]) 2011   Right Lumpectomy and Rad tx's.  . Depression   . GERD (gastroesophageal reflux disease)   . Hyperlipidemia   . Hypertension   . Hypothyroidism   . Narcolepsy and cataplexy   . Neuropathy   . Osteoporosis, post-menopausal   . RLS (restless legs syndrome)   . Sleep apnea     Past Surgical History:  Procedure Laterality Date  . ABDOMINAL HYSTERECTOMY    . APPENDECTOMY    . BREAST SURGERY    . CARDIAC CATHETERIZATION N/A 11/25/2014   Procedure: Left Heart Cath;  Surgeon: Teodoro Spray, MD;  Location: [place] [facility];  Service: Cardiovascular;  Laterality: N/A;  . CHOLECYSTECTOMY    . COLONOSCOPY WITH PROPOFOL N/A 08/05/2018   Procedure: COLONOSCOPY WITH PROPOFOL;  Surgeon: Lollie Sails, MD;  Location: [facility];  Service: Endoscopy;  Laterality: N/A;  . MASTECTOMY Right    partial/lumpectomy    There were no vitals filed for this visit.  Subjective Assessment - 12/24/18 0801    Subjective  Patient reports feeling unsteady on feet today and having increased pain in  the feet. Patient equates the resolution in LE swelling to a reduction in gabapentin which makes her hesitant to address the neuropathy with pharmaceutical management. She goes for an MRI later today of the cervical spine and will have a lumbar spine MRI in the imminent future.    Pertinent History  Increased severity over last 2 weeks    Limitations  Sitting;Standing    How long can you sit comfortably?  10 min    How long can you stand comfortably?  60 min with intermittent support    How long can you walk comfortably?  30 min (limited more by L hip pain and deconditioning)    Diagnostic tests  nerve conduction > 3 years ago    Patient Stated Goals  improve balance    Currently in Pain?  Yes    Pain Score  7     Pain Location  Foot    Pain Orientation  Left;Right    Pain Type  Neuropathic pain    Pain Radiating Towards  radiates up toward shin    Pain Onset  More than a month ago    Pain Onset  More than a month ago      TREATMENT  Manual Therapy: L hip distraction, 2 x 3 min oscillations each L hip lateral distraction  with belt at 90 degrees of hip flexion, patient reports increased back pain as a result. L hip STM/TPR over gluteal region  Neuromuscular Re-education: Hooklying pelvic tilts x10 Hooklying diaphragmatic breathing x20 Hooklying TrA activation with coordinated breath x20 Sidelying hip articular rotations x5 with VCs and TCs for sequencing Sidlying hip flexion and hip extension without knee compensations (VCs and TCs) x10  Patient educated throughout session on appropriate technique and form using multi-modal cueing, HEP, and activity modification. Patient articulated understanding and returned demonstration.   ASSESSMENT Patient presents to clinic to participate in therapy despite high level of foot pain. Patient demonstrates deficits in dynamic balance, strength, neural tension, and posture. Patient able to achieve isolated hip flexion and extension with  significant cueing during today's session and responded positively to neuromuscular re-education and manual interventions. Patient will benefit from continued skilled therapeutic intervention to address remaining deficits in balance, strength, activity tolerance, and posture in order to decrease risk of falls, increase function, and improve overall QOL.      PT Short Term Goals - 12/09/18 1333      PT SHORT TERM GOAL #1   Title  Pt will be independent with HEP in order to improve strength and balance in order to decrease fall risk and improve function at home and work.    Baseline  Not initiated    Time  2    Period  Weeks    Status  Achieved    Target Date  11/19/18        PT Long Term Goals - 12/17/18 1015      PT LONG TERM GOAL #1   Title  Patient will improve BERG by at least 3 points in order to demonstrate clinically significant improvement in balance.      Baseline  IE: 41/56 12/09/2018: 53/56    Time  4    Period  Weeks      PT LONG TERM GOAL #2   Title  Patient will decrease 5TSTS by at least 3 seconds in order to demonstrate clinically significant improvement in LE strength and decrease risk of falls.    Baseline  IE: 17 s, BUE on thighs; 12/09/2018: 15.3 sec, intermittent UE support on thighs    Time  4    Period  Weeks    Status  Partially Met    Target Date  01/08/19      PT LONG TERM GOAL #3   Title  Patient will report intermittent L hip pain to be 3/10 or less on NPRS to demonstrate clinically significant improvement in pain and improve overall function.     Baseline  IE: constant 5/10; 12/09/2018: 5/10    Time  4    Period  Weeks    Status  On-going    Target Date  01/08/19      PT LONG TERM GOAL #4   Title  Patient will demonstrate improved L hip ROM and decreased nerual tension as evidenced by L SLR > 55 degrees before symptom onset for improved quality of sleep.    Baseline  12/09/2018: 44 degrees    Time  4    Period  Weeks    Status  New    Target  Date  01/08/19            Plan - 12/24/18 1202    Clinical Impression Statement  Patient presents to clinic to participate in therapy despite high level of foot pain. Patient demonstrates deficits in dynamic balance,  strength, neural tension, and posture. Patient able to achieve isolated hip flexion and extension with significant cueing during today's session and responded positively to neuromuscular re-education and manual interventions. Patient will benefit from continued skilled therapeutic intervention to address remaining deficits in balance, strength, activity tolerance, and posture in order to decrease risk of falls, increase function, and improve overall QOL.    Personal Factors and Comorbidities  Comorbidity 3+;Past/Current Experience;Fitness;Behavior Pattern;Age    Comorbidities  restless leg syndrome; lymphedema; hyperlipidemia; HTN; OA    Examination-Activity Limitations  Bed Mobility;Lift;Bend;Sleep;Sit;Transfers;Reach Overhead;Stairs    Examination-Participation Restrictions  Cleaning;Laundry;Yard Work;Other;Community Activity;Driving    Stability/Clinical Decision Making  Evolving/Moderate complexity    Rehab Potential  Fair    PT Frequency  2x / week    PT Duration  4 weeks    PT Treatment/Interventions  ADLs/Self Care Home Management;Moist Heat;Stair training;Gait training;Functional mobility training;Neuromuscular re-education;Balance training;Therapeutic exercise;Therapeutic activities;Patient/family education;Manual techniques;Taping;Dry needling;Cryotherapy    PT Next Visit Plan  L hip distraction; B glute strengthening, balance    PT Home Exercise Plan  L hip flexor stretch, gentle posterior pelvic tilts    Consulted and Agree with Plan of Care  Patient       Patient will benefit from skilled therapeutic intervention in order to improve the following deficits and impairments:  Abnormal gait, Impaired sensation, Improper body mechanics, Pain, Decreased mobility,  Postural dysfunction, Decreased activity tolerance, Decreased endurance, Decreased range of motion, Decreased strength, Decreased balance, Difficulty walking, Increased edema, Obesity, Increased muscle spasms, Impaired tone, Impaired flexibility  Visit Diagnosis: 1. Difficulty in walking, not elsewhere classified   2. Muscle weakness (generalized)   3. Chronic left hip pain   4. Unsteadiness on feet        Problem List Patient Active Problem List   Diagnosis Date Noted  . Tachycardia 03/01/2018  . Benign breast lumps 11/26/2017  . Cancer ([place]) 11/26/2017  . Depression 11/26/2017  . Hyperlipidemia 11/26/2017  . Hypertension 11/26/2017  . Osteoporosis, post-menopausal 11/26/2017  . Arthritis 03/26/2017  . Neuropathy 03/26/2017  . Leg pain, bilateral 03/26/2017  . Achilles tendinosis 03/26/2017  . RLS (restless legs syndrome) 03/26/2017  . Chronic deep vein thrombosis (DVT) of proximal vein of lower extremity ([place]) 03/26/2017  . Other insomnia 01/02/2017  . Chills without fever 12/26/2016  . Chronic pain of left heel 12/26/2016  . Increased band cell count 12/26/2016  . Vitamin D deficiency 11/14/2016  . Weight gain with edema 11/14/2016  . Other headache syndrome 11/07/2016  . Hyponatremia 10/21/2016  . Prediabetes 08/08/2016  . Irritable bowel syndrome with constipation and diarrhea 01/20/2015  . Obesity, morbid ([place]) 01/20/2015  . Adult BMI 35.0-35.9 kg/sq m 12/24/2014  . Obstructive sleep apnea syndrome 12/03/2014  . Multiple joint pain 07/22/2014  . BRCA negative 01/20/2014  . Malignant neoplasm of right female breast [facility]) 12/15/2013   Myles Gip PT, DPT [phone] 12/24/2018, 12:07 PM   [facility] [address] [place], [place], [address] Phone: [phone]   Fax:  [phone]  Name: TYAISHA CULLOM MRN: [id number] Date of Birth: 20-Dec-1950

## 2018-12-25 ENCOUNTER — Ambulatory Visit
Admission: RE | Admit: 2018-12-25 | Discharge: 2018-12-25 | Disposition: A | Discharge status: Home / Self Care | Payer: Medicare Other | Source: Ambulatory Visit | Attending: Neurology | Admitting: Neurology

## 2018-12-25 DIAGNOSIS — R200 Anesthesia of skin: Secondary | ICD-10-CM

## 2018-12-25 DIAGNOSIS — R202 Paresthesia of skin: Secondary | ICD-10-CM | POA: Diagnosis present

## 2018-12-31 ENCOUNTER — Encounter: Payer: Self-pay | Admitting: Physical Therapy

## 2018-12-31 ENCOUNTER — Ambulatory Visit: Payer: Medicare Other | Admitting: Physical Therapy

## 2018-12-31 ENCOUNTER — Other Ambulatory Visit: Payer: Self-pay

## 2018-12-31 DIAGNOSIS — R262 Difficulty in walking, not elsewhere classified: Secondary | ICD-10-CM

## 2018-12-31 DIAGNOSIS — R2681 Unsteadiness on feet: Secondary | ICD-10-CM

## 2018-12-31 DIAGNOSIS — M6281 Muscle weakness (generalized): Secondary | ICD-10-CM

## 2018-12-31 DIAGNOSIS — G8929 Other chronic pain: Secondary | ICD-10-CM

## 2018-12-31 NOTE — Therapy (Signed)
[facility] [address] [place], [place], [address] Phone: [phone]   Fax:  [phone]  Physical Therapy Treatment  Patient Details  Name: Karen [name] MRN: [id number] Date of Birth: [birth year] Referring Provider (PT): Jennings Books   Encounter Date: 12/31/2018  PT End of Session - 12/31/18 1205    Visit Number  12    Number of Visits  18    Date for PT Re-Evaluation  01/07/19    Authorization Type  1/10 (Medicare)    PT Start Time  0800    PT Stop Time  0840    PT Time Calculation (min)  40 min    Equipment Utilized During Treatment  Gait belt    Activity Tolerance  Patient tolerated treatment well    Behavior During Therapy  [facility] for tasks assessed/performed       Past Medical History:  Diagnosis Date  . Acquired trigger finger of both middle fingers   . Arthritis   . Breast cancer, right ([place]) 2011   Right Lumpectomy and Rad tx's.  . Depression   . GERD (gastroesophageal reflux disease)   . Hyperlipidemia   . Hypertension   . Hypothyroidism   . Narcolepsy and cataplexy   . Neuropathy   . Osteoporosis, post-menopausal   . RLS (restless legs syndrome)   . Sleep apnea     Past Surgical History:  Procedure Laterality Date  . ABDOMINAL HYSTERECTOMY    . APPENDECTOMY    . BREAST SURGERY    . CARDIAC CATHETERIZATION N/A 11/25/2014   Procedure: Left Heart Cath;  Surgeon: Teodoro Spray, MD;  Location: [facility];  Service: Cardiovascular;  Laterality: N/A;  . CHOLECYSTECTOMY    . COLONOSCOPY WITH PROPOFOL N/A 08/05/2018   Procedure: COLONOSCOPY WITH PROPOFOL;  Surgeon: Lollie Sails, MD;  Location: [facility] ENDOSCOPY;  Service: Endoscopy;  Laterality: N/A;  . MASTECTOMY Right    partial/lumpectomy    There were no vitals filed for this visit.  Subjective Assessment - 12/31/18 0803    Subjective  Patient reports feeling unsteady on feet today and having increased pain in the feet. Patient equates  the resolution in LE swelling to a reduction in gabapentin which makes her hesitant to address the neuropathy with pharmaceutical management. She goes for an MRI later today of the cervical spine and will have a lumbar spine MRI in the imminent future.    Pertinent History  Increased severity over last 2 weeks    Limitations  Sitting;Standing    How long can you sit comfortably?  10 min    How long can you stand comfortably?  60 min with intermittent support    How long can you walk comfortably?  30 min (limited more by L hip pain and deconditioning)    Diagnostic tests  nerve conduction > 3 years ago    Patient Stated Goals  improve balance    Pain Onset  More than a month ago    Pain Onset  More than a month ago      TREATMENT  Neuromuscular Re-education: Reviewed HEP and balance strategies.  Extensive education utilizing motivational interviewing techniques on topics: overall health management, sleep hygiene, health priorities.  Patient educated throughout session on appropriate technique and form using multi-modal cueing, HEP, and activity modification. Patient articulated understanding and returned demonstration.  ASSESSMENT Patient presents to clinic to participate in therapy. Patient demonstrates continued deficits in dynamic balance, strength, neural tension, and  posture. Patient's progress has been limited by long-standing comorbidity: insomnia. PT and patient discussed at length where patient's resources would be best utilized considering her worsening insomnia; PT and patient agree that physical therapy is best deferred until her other health concerns are better managed. Patient will benefit from continued skilled therapeutic intervention to address remaining deficits in dynamic balance, strength, neural tension, and posture once her overall health is better managed in order to further improve overall QOL.     PT Short Term Goals - 12/09/18 1333      PT SHORT TERM GOAL #1    Title  Pt will be independent with HEP in order to improve strength and balance in order to decrease fall risk and improve function at home and work.    Baseline  Not initiated    Time  2    Period  Weeks    Status  Achieved    Target Date  11/19/18        PT Long Term Goals - 12/17/18 1015      PT LONG TERM GOAL #1   Title  Patient will improve BERG by at least 3 points in order to demonstrate clinically significant improvement in balance.      Baseline  IE: 41/56 12/09/2018: 53/56    Time  4    Period  Weeks      PT LONG TERM GOAL #2   Title  Patient will decrease 5TSTS by at least 3 seconds in order to demonstrate clinically significant improvement in LE strength and decrease risk of falls.    Baseline  IE: 17 s, BUE on thighs; 12/09/2018: 15.3 sec, intermittent UE support on thighs    Time  4    Period  Weeks    Status  Partially Met    Target Date  01/08/19      PT LONG TERM GOAL #3   Title  Patient will report intermittent L hip pain to be 3/10 or less on NPRS to demonstrate clinically significant improvement in pain and improve overall function.     Baseline  IE: constant 5/10; 12/09/2018: 5/10    Time  4    Period  Weeks    Status  On-going    Target Date  01/08/19      PT LONG TERM GOAL #4   Title  Patient will demonstrate improved L hip ROM and decreased nerual tension as evidenced by L SLR > 55 degrees before symptom onset for improved quality of sleep.    Baseline  12/09/2018: 44 degrees    Time  4    Period  Weeks    Status  New    Target Date  01/08/19            Plan - 12/31/18 1205    Clinical Impression Statement  Patient presents to clinic to participate in therapy. Patient demonstrates continued deficits in dynamic balance, strength, neural tension, and posture. Patient's progress has been limited by long-standing comorbidity: insomnia. PT and patient discussed at length where patient's resources would be best utilized considering her worsening  insomnia; PT and patient agree that physical therapy is best deferred until her other health concerns are better managed. Patient will benefit from continued skilled therapeutic intervention to address remaining deficits in dynamic balance, strength, neural tension, and posture once her overall health is better managed in order to further improve overall QOL.    Personal Factors and Comorbidities  Comorbidity 3+;Past/Current Experience;Fitness;Behavior Pattern;Age  Comorbidities  restless leg syndrome; lymphedema; hyperlipidemia; HTN; OA    Examination-Activity Limitations  Bed Mobility;Lift;Bend;Sleep;Sit;Transfers;Reach Overhead;Stairs    Examination-Participation Restrictions  Cleaning;Laundry;Yard Work;Other;Community Activity;Driving    Stability/Clinical Decision Making  Evolving/Moderate complexity    Rehab Potential  Fair    PT Frequency  2x / week    PT Duration  4 weeks    PT Treatment/Interventions  ADLs/Self Care Home Management;Moist Heat;Stair training;Gait training;Functional mobility training;Neuromuscular re-education;Balance training;Therapeutic exercise;Therapeutic activities;Patient/family education;Manual techniques;Taping;Dry needling;Cryotherapy    PT Next Visit Plan  L hip distraction; B glute strengthening, balance    PT Home Exercise Plan  L hip flexor stretch, gentle posterior pelvic tilts    Consulted and Agree with Plan of Care  Patient       Patient will benefit from skilled therapeutic intervention in order to improve the following deficits and impairments:  Abnormal gait, Impaired sensation, Improper body mechanics, Pain, Decreased mobility, Postural dysfunction, Decreased activity tolerance, Decreased endurance, Decreased range of motion, Decreased strength, Decreased balance, Difficulty walking, Increased edema, Obesity, Increased muscle spasms, Impaired tone, Impaired flexibility  Visit Diagnosis: 1. Difficulty in walking, not elsewhere classified   2. Muscle  weakness (generalized)   3. Chronic left hip pain   4. Unsteadiness on feet        Problem List Patient Active Problem List   Diagnosis Date Noted  . Tachycardia 03/01/2018  . Benign breast lumps 11/26/2017  . Cancer ([place]) 11/26/2017  . Depression 11/26/2017  . Hyperlipidemia 11/26/2017  . Hypertension 11/26/2017  . Osteoporosis, post-menopausal 11/26/2017  . Arthritis 03/26/2017  . Neuropathy 03/26/2017  . Leg pain, bilateral 03/26/2017  . Achilles tendinosis 03/26/2017  . RLS (restless legs syndrome) 03/26/2017  . Chronic deep vein thrombosis (DVT) of proximal vein of lower extremity ([place]) 03/26/2017  . Other insomnia 01/02/2017  . Chills without fever 12/26/2016  . Chronic pain of left heel 12/26/2016  . Increased band cell count 12/26/2016  . Vitamin D deficiency 11/14/2016  . Weight gain with edema 11/14/2016  . Other headache syndrome 11/07/2016  . Hyponatremia 10/21/2016  . Prediabetes 08/08/2016  . Irritable bowel syndrome with constipation and diarrhea 01/20/2015  . Obesity, morbid ([place]) 01/20/2015  . Adult BMI 35.0-35.9 kg/sq m 12/24/2014  . Obstructive sleep apnea syndrome 12/03/2014  . Multiple joint pain 07/22/2014  . BRCA negative 01/20/2014  . Malignant neoplasm of right female breast [facility]) 12/15/2013   Myles Gip PT, DPT [phone] 12/31/2018, 12:13 PM  [facility] [address] [place], [place], [address] Phone: [phone]   Fax:  [phone]  Name: KATRESE SHELL MRN: [id number] Date of Birth: 07-25-1950

## 2019-01-07 ENCOUNTER — Ambulatory Visit: Payer: Medicare Other | Admitting: Physical Therapy

## 2019-02-16 IMAGING — MR MR HEEL *L* W/O CM
5 series · 38 of 40 positions shown · non-contrast
Comparison: None.

CLINICAL DATA: Posterior ankle pain and swelling for 1 month.

EXAM:
MR OF THE LEFT HEEL WITHOUT CONTRAST
TECHNIQUE: Multiplanar, multisequence MR imaging of the ankle was performed. No
intravenous contrast was administered.

[Series 7: PD fat-sat · axial · 3.0mm · 0.35mm/px · z∈[-83,+58]mm · 8 of 40 slices shown]
[im 1/40]
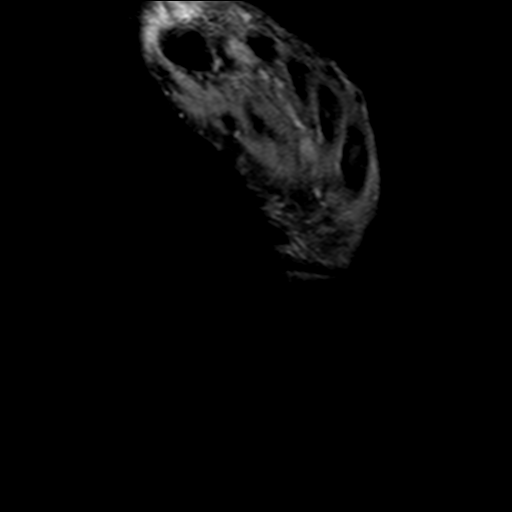
[im 5/40]
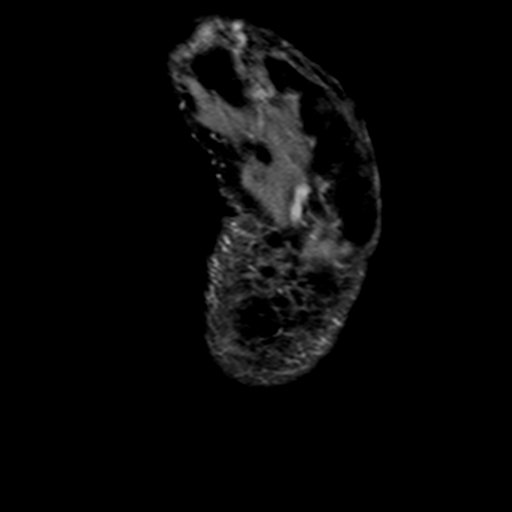
[im 14/40]
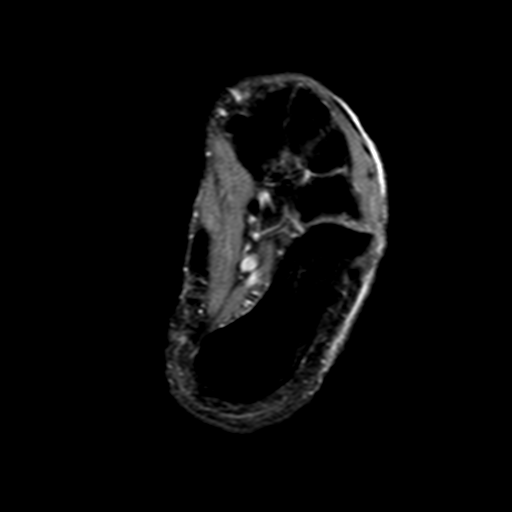
[im 18/40]
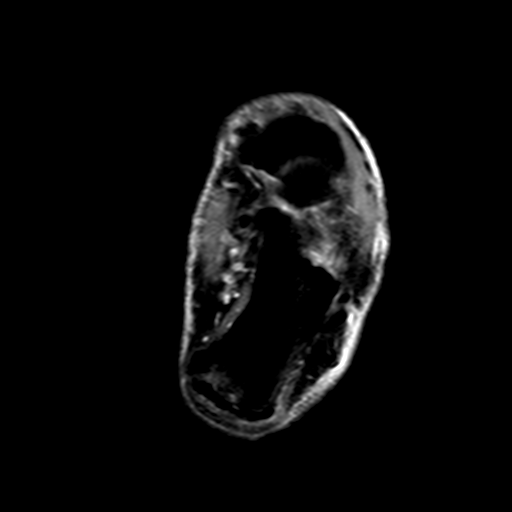
[im 22/40]
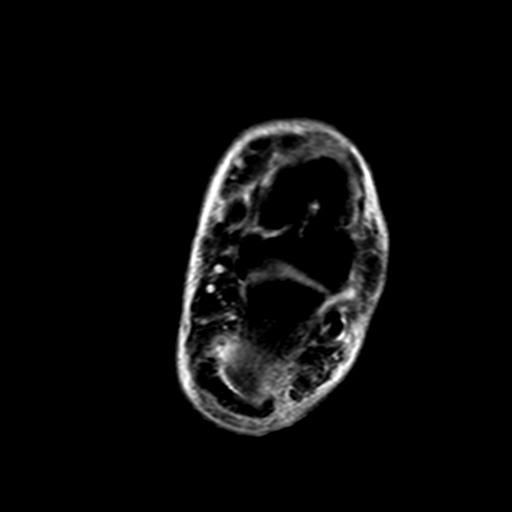
[im 27/40]
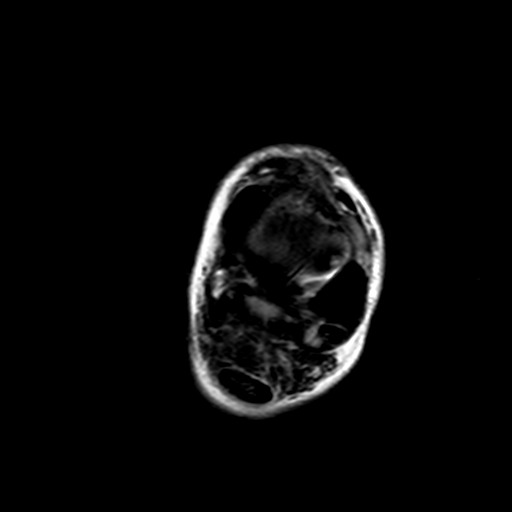
[im 35/40]
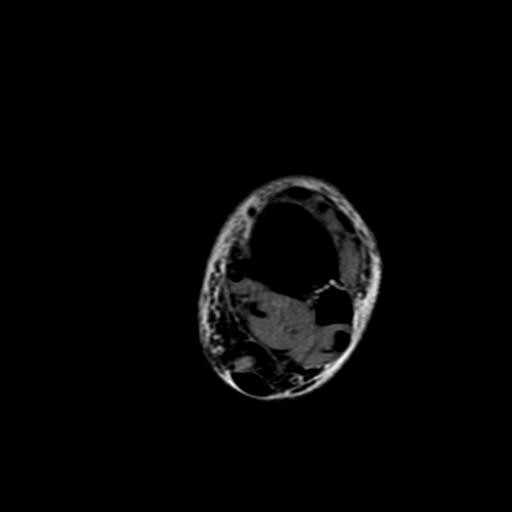
[im 40/40]
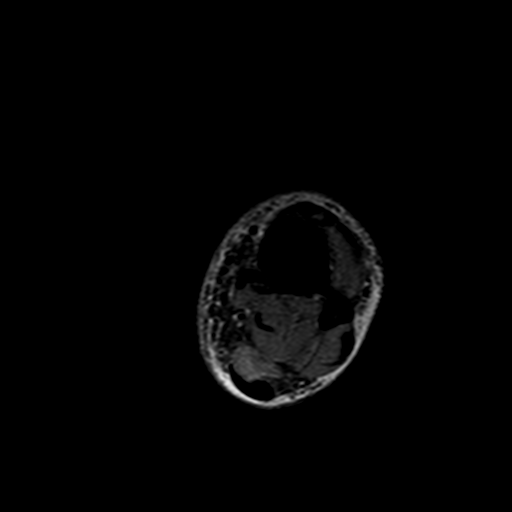

[Series 8: T2 fat-sat · axial · 3.0mm · 0.56mm/px · z∈[-83,+58]mm · 9 of 40 slices shown (1 of 3)]
[im 1/40]
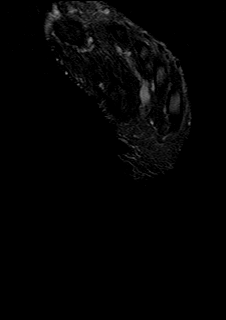
[im 5/40]
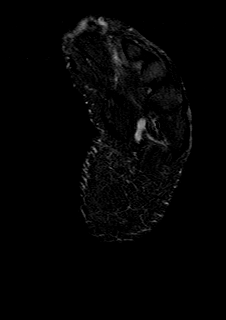
[im 10/40]
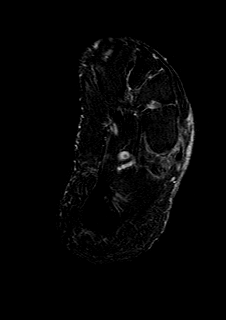
[im 15/40]
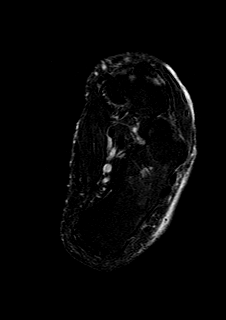
[im 20/40]
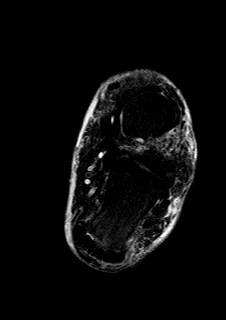
[im 25/40]
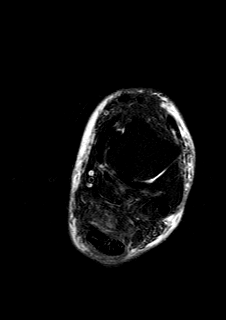
[im 30/40]
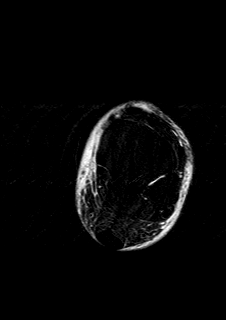
[im 35/40]
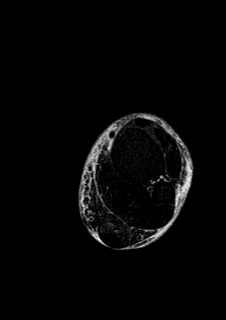
[im 40/40]
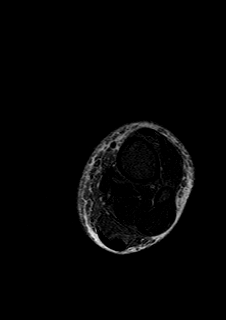

[Series 9: T2 fat-sat · coronal · 3.0mm · 0.70mm/px · 9 of 42 slices shown (2 of 3)]
[im 1/42]
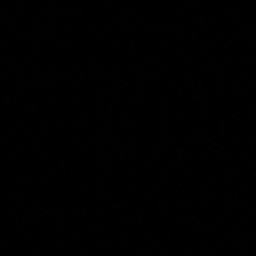
[im 6/42]
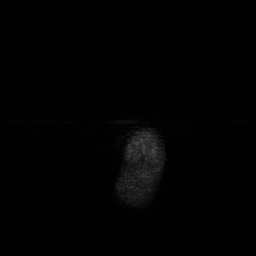
[im 11/42]
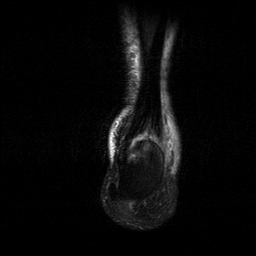
[im 16/42]
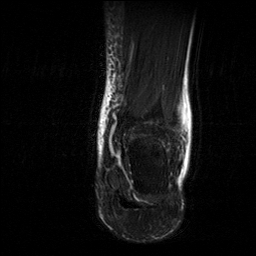
[im 21/42]
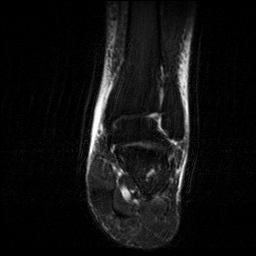
[im 26/42]
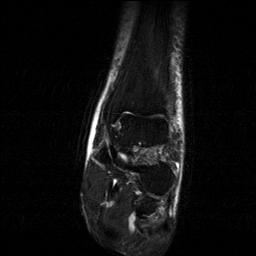
[im 31/42]
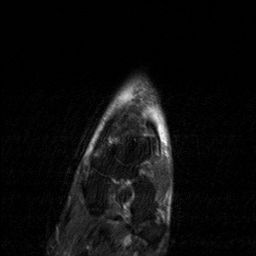
[im 36/42]
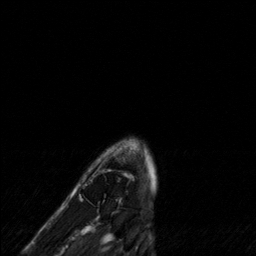
[im 42/42]
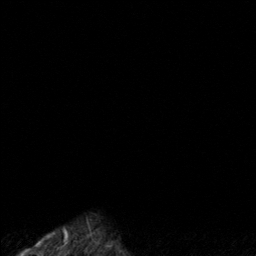

[Series 10: T1 · oblique · 3.0mm · 0.56mm/px · 6 of 29 slices shown]
[im 1/29]
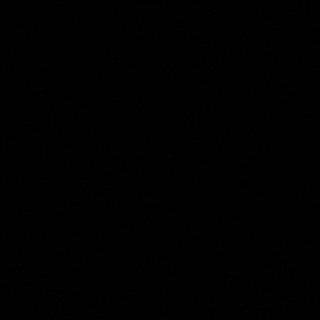
[im 6/29]
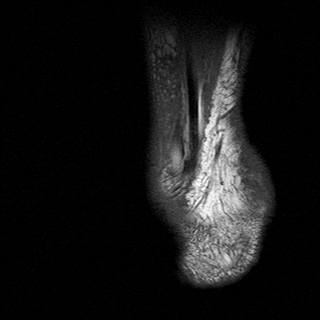
[im 12/29]
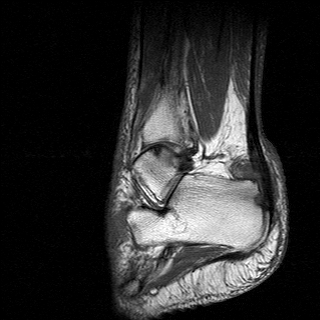
[im 17/29]
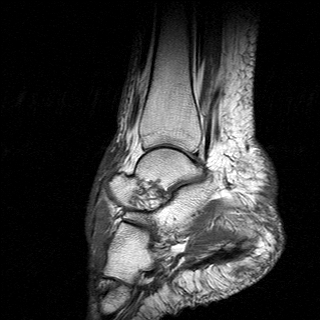
[im 23/29]
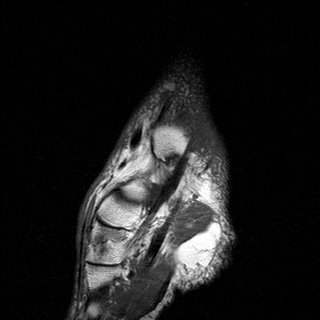
[im 29/29]
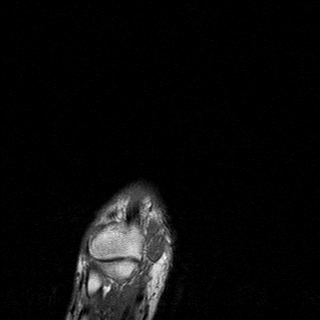

[Series 11: T2 fat-sat · oblique · 3.0mm · 0.56mm/px · 6 of 29 slices shown (3 of 3)]
[im 1/29]
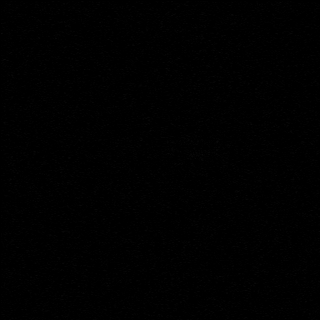
[im 6/29]
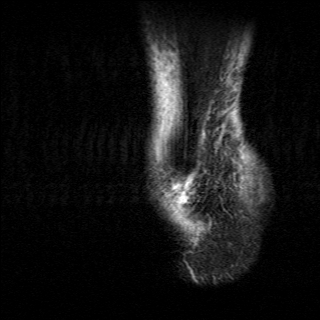
[im 12/29]
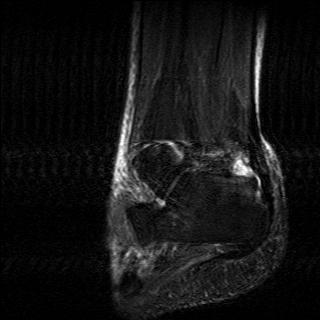
[im 17/29]
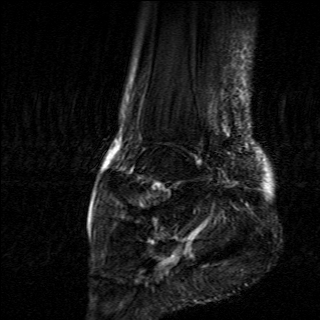
[im 23/29]
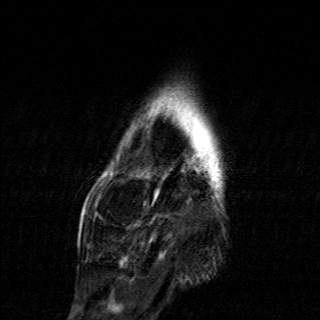
[im 29/29]
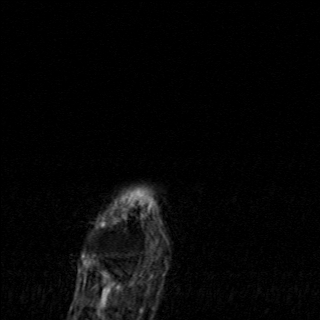

[38 of 40 positions shown; findings below may reference images not displayed]

FINDINGS: TENDONS

Peroneal: Intact.

Posteromedial: Intact.

Anterior: Intact.

Achilles: Significant distal Achilles tendinopathy. The tendon is
markedly thickened and demonstrates increased T2 signal intensity.
This also partial thickness tearing of the deep fibers at the level
of the top of the calcaneus probably in part due to the Haglund's
deformity or pump bump. There is also reactive marrow edema in the
calcaneus and retrocalcaneal bursitis.

Plantar Fascia: Intact

LIGAMENTS

Lateral: Intact

Medial: Intact

CARTILAGE

Ankle Joint: Mild degenerative changes with cartilage thinning and
joint space narrowing and minimal spurring. No full-thickness
cartilage defect or osteochondral lesion. No joint effusion.

Subtalar Joints/Sinus Tarsi: Mild subtalar joint degenerative
changes. The sinus tarsi is grossly normal. Moderate midfoot
degenerative changes are noted. Hindfoot varus deformity noted.

Bones: No stress fracture or AVN.

Other: Normal foot musculature.
IMPRESSION: 1. Significant distal Achilles tendinopathy with partial-thickness
tearing of the deep fibers. Haglund's deformity is likely
contributory. Associated reactive marrow edema in the calcaneus hand
retrocalcaneal bursitis.
2. Intact medial and lateral ankle ligaments and tendons.
3. Hindfoot varus and subtalar and midfoot degenerative changes.

## 2019-09-04 ENCOUNTER — Other Ambulatory Visit: Payer: Self-pay | Admitting: Cardiology

## 2019-09-04 DIAGNOSIS — R140 Abdominal distension (gaseous): Secondary | ICD-10-CM

## 2019-09-04 DIAGNOSIS — G619 Inflammatory polyneuropathy, unspecified: Secondary | ICD-10-CM

## 2019-09-16 ENCOUNTER — Ambulatory Visit: Payer: Medicare Other

## 2019-09-23 ENCOUNTER — Ambulatory Visit (INDEPENDENT_AMBULATORY_CARE_PROVIDER_SITE_OTHER): Payer: Medicare Other

## 2019-09-23 ENCOUNTER — Encounter: Payer: Self-pay | Admitting: Podiatry

## 2019-09-23 ENCOUNTER — Other Ambulatory Visit: Payer: Self-pay

## 2019-09-23 ENCOUNTER — Ambulatory Visit: Payer: Medicare Other | Admitting: Podiatry

## 2019-09-23 VITALS — Temp 98.1°F

## 2019-09-23 DIAGNOSIS — G629 Polyneuropathy, unspecified: Secondary | ICD-10-CM | POA: Diagnosis not present

## 2019-09-23 DIAGNOSIS — M779 Enthesopathy, unspecified: Secondary | ICD-10-CM

## 2019-09-23 MED ORDER — METANX 3-90.314-2-35 MG PO CAPS: 1.0000 | ORAL_CAPSULE | Freq: Every day | ORAL | 2 refills | Status: DC

## 2019-09-23 NOTE — Progress Notes (Signed)
   HPI: 69 y.o. female presenting today for new complaints regarding the bilateral lower extremities.  Patient was last seen in the office on 09/10/2018.  Patient states that over the past year she has experienced significant amount of burning tingling numbness to the bilateral lower extremities.  She is also noticed a loss of sensation with loss of stability.  She is seen multiple physicians and received multiple diagnoses today.  She states that at times her legs feel like they are in a vice grip.  She presents for further treatment evaluation  Past Medical History:  Diagnosis Date  . Acquired trigger finger of both middle fingers   . Arthritis   . Breast cancer, right ([place]) 2011   Right Lumpectomy and Rad tx's.  . Depression   . GERD (gastroesophageal reflux disease)   . Hyperlipidemia   . Hypertension   . Hypothyroidism   . Narcolepsy and cataplexy   . Neuropathy   . Osteoporosis, post-menopausal   . RLS (restless legs syndrome)   . Sleep apnea      Physical Exam: General: The patient is alert and oriented x3 in no acute distress.  Dermatology: Skin is warm, dry and supple bilateral lower extremities. Negative for open lesions or macerations.  Vascular: Palpable pedal pulses bilaterally.  There is some minimal edema noted to the bilateral lower extremities. Capillary refill within normal limits.  Neurological: Epicritic and protective threshold diminished bilaterally.   Musculoskeletal Exam: History of bilateral bunionectomies with hammertoe repairs  Radiographic Exam:  Normal osseous mineralization. No fracture/dislocation/boney destruction.  Orthopedic hardware intact with evidence of bunionectomy and hammertoe repairs  Assessment: 1.  Small fiber neuropathy as per biopsies taken previously 2.  Positive rheumatoid factor 3.  Abnormal nerve conduction velocity studies 4.  Suspect peripheral polyneuropathy   Plan of Care:  1. Patient evaluated. X-Rays reviewed.  2.   Patient states that I am her 12 position to evaluate her for her current symptoms.  I explained I do not have any other diagnostic modalities that have not already been offered. 3.  Patient has an appointment with vascular on 09/29/2019 4.  Patient received lumbar injections 2 weeks ago by pain management at [facility] 5.  Patient has seen multiple specialists including neurology at [facility], neuromuscular specialist at [facility], pain management at [facility], and orthopedics. 6.  Prescription for Metanx nutritional supplement for neuropathy sent to pharmacy 7.  Return to clinic as needed      Edrick Kins, DPM [facility]  Dr. Edrick Kins, DPM    [address], [place] [address]                Office [phone]  Fax [phone]

## 2019-10-01 ENCOUNTER — Ambulatory Visit: Payer: Medicare Other

## 2019-10-02 ENCOUNTER — Other Ambulatory Visit: Payer: Self-pay | Admitting: Podiatry

## 2019-10-02 DIAGNOSIS — G629 Polyneuropathy, unspecified: Secondary | ICD-10-CM

## 2019-11-11 ENCOUNTER — Other Ambulatory Visit: Payer: Self-pay | Admitting: Gastroenterology

## 2019-11-11 DIAGNOSIS — R1084 Generalized abdominal pain: Secondary | ICD-10-CM

## 2019-11-11 DIAGNOSIS — K8689 Other specified diseases of pancreas: Secondary | ICD-10-CM

## 2019-11-18 ENCOUNTER — Other Ambulatory Visit: Payer: Self-pay

## 2019-11-18 ENCOUNTER — Encounter: Payer: Self-pay | Admitting: Physical Therapy

## 2019-11-18 ENCOUNTER — Ambulatory Visit: Payer: Medicare Other | Attending: Neurology

## 2019-11-18 DIAGNOSIS — R262 Difficulty in walking, not elsewhere classified: Secondary | ICD-10-CM | POA: Diagnosis present

## 2019-11-18 DIAGNOSIS — R2681 Unsteadiness on feet: Secondary | ICD-10-CM | POA: Diagnosis present

## 2019-11-18 DIAGNOSIS — G8929 Other chronic pain: Secondary | ICD-10-CM | POA: Diagnosis present

## 2019-11-18 DIAGNOSIS — M6281 Muscle weakness (generalized): Secondary | ICD-10-CM | POA: Diagnosis present

## 2019-11-18 DIAGNOSIS — M25552 Pain in left hip: Secondary | ICD-10-CM | POA: Diagnosis present

## 2019-11-18 DIAGNOSIS — M25562 Pain in left knee: Secondary | ICD-10-CM | POA: Diagnosis present

## 2019-11-18 NOTE — Therapy (Signed)
[facility] [address]. [place], [place], [address] Phone: [phone]   Fax:  [phone]  Physical Therapy Evaluation  Patient Details  Name: Karen [name] MRN: [id number] Date of Birth: [birth year] No data recorded  Encounter Date: 11/18/2019  PT End of Session - 11/18/19 0743    Visit Number  1    Number of Visits  16    Date for PT Re-Evaluation  01/13/20    Authorization Type  1/10 (Medicare)    PT Start Time  0730    PT Stop Time  0812    PT Time Calculation (min)  42 min    Equipment Utilized During Treatment  Gait belt    Activity Tolerance  Patient tolerated treatment well;Patient limited by pain    Behavior During Therapy  WFL for tasks assessed/performed       Past Medical History:  Diagnosis Date  . Acquired trigger finger of both middle fingers   . Arthritis   . Breast cancer, right ([place]) 2011   Right Lumpectomy and Rad tx's.  . Depression   . GERD (gastroesophageal reflux disease)   . Hyperlipidemia   . Hypertension   . Hypothyroidism   . Narcolepsy and cataplexy   . Neuropathy   . Osteoporosis, post-menopausal   . RLS (restless legs syndrome)   . Sleep apnea     Past Surgical History:  Procedure Laterality Date  . ABDOMINAL HYSTERECTOMY    . APPENDECTOMY    . BREAST SURGERY    . CARDIAC CATHETERIZATION N/A 11/25/2014   Procedure: Left Heart Cath;  Surgeon: Teodoro Spray, MD;  Location: [facility];  Service: Cardiovascular;  Laterality: N/A;  . CHOLECYSTECTOMY    . COLONOSCOPY WITH PROPOFOL N/A 08/05/2018   Procedure: COLONOSCOPY WITH PROPOFOL;  Surgeon: Lollie Sails, MD;  Location: [facility] ENDOSCOPY;  Service: Endoscopy;  Laterality: N/A;  . MASTECTOMY Right    partial/lumpectomy    There were no vitals filed for this visit.   Subjective Assessment - 11/18/19 0733    Subjective  Patient is returning to therapy for balance and difficulty walking, unsteadiness.    Pertinent  History  Patient is returning to therapy for balance and difficulty walking, unsteadiness. Stated it has worsened since stopping therapy. 2 falls in the last 6 months. Fell asleep standing up for first fall, two weeks ago pt did not see that there was a step down and fell. Pt reported that she hurt both knees, L more than R, difficulty bending L knee. Pt complained of pain with turning her head to the L and her L arm goes numb when she lifts it up. 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 Pt was in therapy last July, said therapy was helpful. Pt will be getting a CT scan of abdomen next week due to enzyme (pancreatic). Pt would like L knee pain to be addressed in therapy as well. Describes her LE neuropathy symptoms as tightness, numbness pt reported its in her feet and ankles but can travel up her shin intermittently, when it hurts badly extends to just below her knee.    Limitations  Standing;Walking;House hold activities    How long can you sit comfortably?  NA if comfortable chair    How long can you stand comfortably?  not more than 5 minutes    How long can you walk comfortably?  Pt  reported she is able to walk around the grocery store, (use of grocery cart)    Patient Stated Goals  improve balance    Currently in Pain?  No/denies   no pain today, has R and L knee pain especially later in the day        [facility] PT Assessment - 11/18/19 0001      Precautions   Precautions  None      Restrictions   Weight Bearing Restrictions  No      Balance Screen   Has the patient fallen in the past 6 months  Yes    How many times?  2    Has the patient had a decrease in activity level because of a fear of falling?   Yes    Is the patient reluctant to leave their home because of a fear of falling?   Yes      [place] residence      Prior Function   Level of Independence  Independent       Cognition   Overall Cognitive Status  Within Functional Limits for tasks assessed      Berg Balance Test   Sit to Stand  Able to stand without using hands and stabilize independently    Standing Unsupported  Able to stand safely 2 minutes    Sitting with Back Unsupported but Feet Supported on Floor or Stool  Able to sit safely and securely 2 minutes    Stand to Sit  Controls descent by using hands    Transfers  Able to transfer safely, minor use of hands    Standing Unsupported with Eyes Closed  Able to stand 10 seconds safely    Standing Unsupported with Feet Together  Able to place feet together independently and stand for 1 minute with supervision    From Standing, Reach Forward with Outstretched Arm  Can reach forward >12 cm safely (5")    From Standing Position, Pick up Object from [place] to pick up shoe safely and easily    From Standing Position, Turn to Look Behind Over each Shoulder  Looks behind one side only/other side shows less weight shift    Turn 360 Degrees  Able to turn 360 degrees safely but slowly    Standing Unsupported, Alternately Place Feet on Step/Stool  Able to stand independently and complete 8 steps >20 seconds    Standing Unsupported, One Foot in Front  Able to plae foot ahead of the other independently and hold 30 seconds    Standing on One Leg  Tries to lift leg/unable to hold 3 seconds but remains standing independently    Total Score  45        SUBJECTIVE Chief complaint: falls, unsteadiness, L knee pain Directional pattern for falls: None Prior history of physical therapy for balance: yes Red flags (bowel/bladder changes, saddle paresthesia, personal history of cancer, chills/fever, night sweats, unrelenting pain) Negative  OBJECTIVE  MUSCULOSKELETAL: Tremor: Absent Bulk: Normal Tone: Normal, no clonus  Posture No gross abnormalities noted in standing or seated posture  Gait Pt ambulates with wide BOS, decreased gait velocity, decreased  step length on LLE.   Strength R/L 4/4-* Hip flexion 4/4 Hip abduction 4-/4- Hip adduction 4/4-* Knee extension 4/4* Knee flexion 4+/4+ Ankle Plantarflexion 4+/4+ Ankle Dorsiflexion 4/4 ankle inversion 4/4 ankle eversion * indicates pain    NEUROLOGICAL:  Mental Status Patient is oriented to person, place and time.  Recent memory is intact.  Remote memory is intact.  Attention span and concentration are intact.  Expressive speech is intact.  Patient's fund of knowledge is within normal limits for educational level.   Sensation Pt endorses decreased light touch sensation of LLE compared RLE but able to feel PT touch   Coordination/Cerebellar Finger to Nose: WNL Heel to Shin: decreased velocity and amplitude of movement Rapid alternating movements: WNL Finger Opposition: WNL Pronator Drift: Negative  FUNCTIONAL OUTCOME MEASURES   Results Comments  BERG 45/56 Fall risk, in need of intervention              5TSTS 27 seconds       10 Meter Gait Speed Self-selected: s = .98ms; Fastest: s = .91 m/s Below normative values for full community ambulation           Pt instructed in gentle L knee ROM (extension and flexion) as well as icing for pain management address symptoms at least twice a day. Pt verbalized understanding. Needs official HEP next visit.    PT Education - 11/18/19 00321   Education Details  POC, HEP ,condition    Person(s) Educated  Patient    Methods  Explanation;Demonstration    Comprehension  Verbalized understanding       PT Short Term Goals - 11/18/19 02248     PT SHORT TERM GOAL #1   Title  Pt will be independent with HEP in order to improve strength and balance in order to decrease fall risk and improve function at home and work.    Baseline  Not initiated    Time  4    Period  Weeks    Status  New    Target Date  12/16/19        PT Long Term Goals - 11/18/19 0828      PT LONG TERM GOAL #1   Title  Patient will improve  BERG by at least 3 points in order to demonstrate clinically significant improvement in balance.      Baseline  6/1 45/56    Time  8    Period  Weeks    Status  New    Target Date  01/13/20      PT LONG TERM GOAL #2   Title  Patient will decrease 5TSTS by at least 3 seconds in order to demonstrate clinically significant improvement in LE strength and decrease risk of falls.    Baseline  6/1 27 seconds with BUE on thighs    Time  8    Period  Weeks    Status  New    Target Date  01/13/20      PT LONG TERM GOAL #3   Title  Pt will demonstrate step over step stair navigation with unilateral UE support    Baseline  unable, step to pattern    Time  8    Period  Weeks    Target Date  01/13/20      PT LONG TERM GOAL #4   Title  Pt will score at least 10 points higher on FOTO score indicating improved functional abilities and activity tolerance.    Baseline  6/1: 40    Time  8    Period  Weeks    Status  New    Target Date  01/13/20             Plan - 11/18/19 0811    Clinical Impression Statement  Pt is  a 76 year-oldfemale referred for difficulty with balance, neuropathy, previous PT experience. PT examination reveals deficits in light touch sensation, gross coordination, LE strength, hip/knee ROM deficits, gait abnormalities and decreased balance. These deficits limit the patient's functional abilities such as standing, walking, overhead activities, squatting, bending, reaching, etc. The patient would benefit from further skilled PT intervention to address these limitations to decrease risk of falls and increase functional abilities.    Personal Factors and Comorbidities  Comorbidity 3+;Past/Current Experience;Fitness;Behavior Pattern;Age    Comorbidities  restless leg syndrome; lymphedema; hyperlipidemia; HTN; OA; breast cancer, narcolepsy, neuropathy, sleep apnea sees and MD for elevated RA markers    Examination-Activity Limitations  Bed  Mobility;Lift;Bend;Sleep;Sit;Transfers;Reach Overhead;Stairs    Examination-Participation Restrictions  Cleaning;Laundry;Yard Work;Other;Community Activity;Driving    Stability/Clinical Decision Making  Evolving/Moderate complexity    Clinical Decision Making  Moderate    Rehab Potential  Fair    PT Frequency  2x / week    PT Duration  8 weeks    PT Treatment/Interventions  ADLs/Self Care Home Management;Moist Heat;Stair training;Gait training;Functional mobility training;Neuromuscular re-education;Balance training;Therapeutic exercise;Therapeutic activities;Patient/family education;Manual techniques;Taping;Dry needling;Cryotherapy    PT Next Visit Plan  HEP, balance training, foot placement practice, stair training    PT Home Exercise Plan  gentle L knee ROM, icing    Consulted and Agree with Plan of Care  Patient       Patient will benefit from skilled therapeutic intervention in order to improve the following deficits and impairments:  Abnormal gait, Impaired sensation, Improper body mechanics, Pain, Decreased mobility, Postural dysfunction, Decreased activity tolerance, Decreased endurance, Decreased range of motion, Decreased strength, Decreased balance, Difficulty walking, Increased edema, Obesity, Increased muscle spasms, Impaired tone, Impaired flexibility  Visit Diagnosis: Difficulty in walking, not elsewhere classified - Plan: PT plan of care cert/re-cert  Muscle weakness (generalized) - Plan: PT plan of care cert/re-cert  Unsteadiness on feet - Plan: PT plan of care cert/re-cert  Acute pain of left knee - Plan: PT plan of care cert/re-cert     Problem List Patient Active Problem List   Diagnosis Date Noted  . Tachycardia 03/01/2018  . Benign breast lumps 11/26/2017  . Cancer ([place]) 11/26/2017  . Depression 11/26/2017  . Hyperlipidemia 11/26/2017  . Hypertension 11/26/2017  . Osteoporosis, post-menopausal 11/26/2017  . Arthritis 03/26/2017  . Neuropathy 03/26/2017  .  Leg pain, bilateral 03/26/2017  . Achilles tendinosis 03/26/2017  . RLS (restless legs syndrome) 03/26/2017  . Chronic deep vein thrombosis (DVT) of proximal vein of lower extremity ([place]) 03/26/2017  . Other insomnia 01/02/2017  . Chills without fever 12/26/2016  . Chronic pain of left heel 12/26/2016  . Increased band cell count 12/26/2016  . Vitamin D deficiency 11/14/2016  . Weight gain with edema 11/14/2016  . Other headache syndrome 11/07/2016  . Hyponatremia 10/21/2016  . Prediabetes 08/08/2016  . Irritable bowel syndrome with constipation and diarrhea 01/20/2015  . Obesity, morbid ([place]) 01/20/2015  . Adult BMI 35.0-35.9 kg/sq m 12/24/2014  . Obstructive sleep apnea syndrome 12/03/2014  . Multiple joint pain 07/22/2014  . BRCA negative 01/20/2014  . Malignant neoplasm of right female breast ([place]) 12/15/2013    Lieutenant Diego PT, DPT 7:55 AM,11/19/19   [facility] [address] [place], [place], [address] Phone: [phone]   Fax:  [phone]  Name: Karen [name] MRN: [id number] Date of Birth: [birth year]

## 2019-11-20 ENCOUNTER — Other Ambulatory Visit: Payer: Self-pay

## 2019-11-20 ENCOUNTER — Ambulatory Visit: Payer: Medicare Other | Admitting: Physical Therapy

## 2019-11-20 ENCOUNTER — Encounter: Payer: Self-pay | Admitting: Physical Therapy

## 2019-11-20 DIAGNOSIS — R2681 Unsteadiness on feet: Secondary | ICD-10-CM

## 2019-11-20 DIAGNOSIS — R262 Difficulty in walking, not elsewhere classified: Secondary | ICD-10-CM

## 2019-11-20 DIAGNOSIS — M6281 Muscle weakness (generalized): Secondary | ICD-10-CM

## 2019-11-20 DIAGNOSIS — G8929 Other chronic pain: Secondary | ICD-10-CM

## 2019-11-20 DIAGNOSIS — M25562 Pain in left knee: Secondary | ICD-10-CM

## 2019-11-21 NOTE — Therapy (Signed)
[facility] [address] [place], [place], [address] Phone: [phone]   Fax:  [phone]  Physical Therapy Treatment  Patient Details  Name: Karen [name] MRN: [id number] Date of Birth: Aug 30, 1950 No data recorded  Encounter Date: 11/20/2019  PT End of Session - 11/21/19 0837    Visit Number  2    Number of Visits  16    Date for PT Re-Evaluation  01/13/20    Authorization Type  2/10 (Medicare)    PT Start Time  0728    PT Stop Time  0820    PT Time Calculation (min)  52 min    Equipment Utilized During Treatment  Gait belt    Activity Tolerance  Patient tolerated treatment well;Patient limited by pain    Behavior During Therapy  WFL for tasks assessed/performed       Past Medical History:  Diagnosis Date  . Acquired trigger finger of both middle fingers   . Arthritis   . Breast cancer, right ([place]) 2011   Right Lumpectomy and Rad tx's.  . Depression   . GERD (gastroesophageal reflux disease)   . Hyperlipidemia   . Hypertension   . Hypothyroidism   . Narcolepsy and cataplexy   . Neuropathy   . Osteoporosis, post-menopausal   . RLS (restless legs syndrome)   . Sleep apnea     Past Surgical History:  Procedure Laterality Date  . ABDOMINAL HYSTERECTOMY    . APPENDECTOMY    . BREAST SURGERY    . CARDIAC CATHETERIZATION N/A 11/25/2014   Procedure: Left Heart Cath;  Surgeon: Teodoro Spray, MD;  Location: [place] [facility];  Service: Cardiovascular;  Laterality: N/A;  . CHOLECYSTECTOMY    . COLONOSCOPY WITH PROPOFOL N/A 08/05/2018   Procedure: COLONOSCOPY WITH PROPOFOL;  Surgeon: Lollie Sails, MD;  Location: [facility] ENDOSCOPY;  Service: Endoscopy;  Laterality: N/A;  . MASTECTOMY Right    partial/lumpectomy    There were no vitals filed for this visit.  Subjective Assessment - 11/20/19 0729    Subjective  Pt. states L knee really hurts as day progresses.  Pt. reports difficulty with stairs/ prolonged  walking/ exercise due to pain in L knee.    Pertinent History  Patient is returning to therapy for balance and difficulty walking, unsteadiness. Stated it has worsened since stopping therapy. 2 falls in the last 6 months. Fell asleep standing up for first fall, two weeks ago pt did not see that there was a step down and fell. Pt reported that she hurt both knees, L more than R, difficulty bending L knee. Pt complained of pain with turning her head to the L and her L arm goes numb when she lifts it up. 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 Pt was in therapy last July, said therapy was helpful. Pt will be getting a CT scan of abdomen next week due to enzyme (pancreatic). Pt would like L knee pain to be addressed in therapy as well. Describes her LE neuropathy symptoms as tightness, numbness pt reported its in her feet and ankles but can travel up her shin intermittently, when it hurts badly extends to just below her knee.    Limitations  Standing;Walking;House hold activities    How long can you sit comfortably?  NA if comfortable chair    How long can you stand comfortably?  not more than 5  minutes    How long can you walk comfortably?  Pt reported she is able to walk around the grocery store, (use of grocery cart)    Patient Stated Goals  improve balance    Currently in Pain?  Yes    Pain Score  3     Pain Location  Knee    Pain Orientation  Left    Pain Descriptors / Indicators  Aching        There.ex.:  Discussed HEP Supine R/L generalized hip/LE stretches (L knee flexion/ manual tx. Limited by pain)- (+) L knee joint line tenderness/ hypomobility.  Neuro.:  Posture assessment in hallway/ clinic with mirror feedback. Increase R lateral lean noted in hip/shoulders.   LLD noted in supine position (R 88 cm./ L 90 cm.)- heel lift issued Pts. Posture corrected with use of plinth and then addition of heel lift.  Pt. Aware  of improvement in posture and benefits from use of SPC on R with L step pattern.  Pt. Will discuss possible MRI of L knee with MD this afternoon.      PT Short Term Goals - 11/18/19 9702      PT SHORT TERM GOAL #1   Title  Pt will be independent with HEP in order to improve strength and balance in order to decrease fall risk and improve function at home and work.    Baseline  Not initiated    Time  4    Period  Weeks    Status  New    Target Date  12/16/19        PT Long Term Goals - 11/18/19 0828      PT LONG TERM GOAL #1   Title  Patient will improve BERG by at least 3 points in order to demonstrate clinically significant improvement in balance.      Baseline  6/1 45/56    Time  8    Period  Weeks    Status  New    Target Date  01/13/20      PT LONG TERM GOAL #2   Title  Patient will decrease 5TSTS by at least 3 seconds in order to demonstrate clinically significant improvement in LE strength and decrease risk of falls.    Baseline  6/1 27 seconds with BUE on thighs    Time  8    Period  Weeks    Status  New    Target Date  01/13/20      PT LONG TERM GOAL #3   Title  Pt will demonstrate step over step stair navigation with unilateral UE support    Baseline  unable, step to pattern    Time  8    Period  Weeks    Target Date  01/13/20      PT LONG TERM GOAL #4   Title  Pt will score at least 10 points higher on FOTO score indicating improved functional abilities and activity tolerance.    Baseline  6/1: 40    Time  8    Period  Weeks    Status  New    Target Date  01/13/20            Plan - 11/21/19 6378    Clinical Impression Statement  PT reassessed pts. ROM/ strength/ gait/ posture and noted a significant R lateral lean/ decrease L LE wt. bearing while standing and walking.  Pt. has a 2 cm. LLD (R 88 cm./ L 90 cm.).  PT used wood plinths in //-bars to correct LLD and change posture wt. bearing.  Pt. issued a heel lift on R to be in shoe to partial  correct LLD and use over the weekend.  Pt. has significant L knee joint limitations/ weakness and tenderness along joint line with palpation.  Pt. is planning to talk with MD (Rheumatologist) about ?MRI to knee.    Personal Factors and Comorbidities  Comorbidity 3+;Past/Current Experience;Fitness;Behavior Pattern;Age    Comorbidities  restless leg syndrome; lymphedema; hyperlipidemia; HTN; OA; breast cancer, narcolepsy, neuropathy, sleep apnea sees and MD for elevated RA markers    Examination-Activity Limitations  Bed Mobility;Lift;Bend;Sleep;Sit;Transfers;Reach Overhead;Stairs    Examination-Participation Restrictions  Cleaning;Laundry;Yard Work;Other;Community Activity;Driving    Stability/Clinical Decision Making  Evolving/Moderate complexity    Clinical Decision Making  Moderate    Rehab Potential  Fair    PT Frequency  2x / week    PT Duration  8 weeks    PT Treatment/Interventions  ADLs/Self Care Home Management;Moist Heat;Stair training;Gait training;Functional mobility training;Neuromuscular re-education;Balance training;Therapeutic exercise;Therapeutic activities;Patient/family education;Manual techniques;Taping;Dry needling;Cryotherapy    PT Next Visit Plan  Issue HEP/ check on posture/ LLE/ heel lift.   Discuss MRI?    PT Home Exercise Plan  gentle L knee ROM, icing    Consulted and Agree with Plan of Care  Patient       Patient will benefit from skilled therapeutic intervention in order to improve the following deficits and impairments:  Abnormal gait, Impaired sensation, Improper body mechanics, Pain, Decreased mobility, Postural dysfunction, Decreased activity tolerance, Decreased endurance, Decreased range of motion, Decreased strength, Decreased balance, Difficulty walking, Increased edema, Obesity, Increased muscle spasms, Impaired tone, Impaired flexibility  Visit Diagnosis: Difficulty in walking, not elsewhere classified  Muscle weakness (generalized)  Unsteadiness on  feet  Acute pain of left knee  Chronic left hip pain     Problem List Patient Active Problem List   Diagnosis Date Noted  . Tachycardia 03/01/2018  . Benign breast lumps 11/26/2017  . Cancer ([place]) 11/26/2017  . Depression 11/26/2017  . Hyperlipidemia 11/26/2017  . Hypertension 11/26/2017  . Osteoporosis, post-menopausal 11/26/2017  . Arthritis 03/26/2017  . Neuropathy 03/26/2017  . Leg pain, bilateral 03/26/2017  . Achilles tendinosis 03/26/2017  . RLS (restless legs syndrome) 03/26/2017  . Chronic deep vein thrombosis (DVT) of proximal vein of lower extremity ([place]) 03/26/2017  . Other insomnia 01/02/2017  . Chills without fever 12/26/2016  . Chronic pain of left heel 12/26/2016  . Increased band cell count 12/26/2016  . Vitamin D deficiency 11/14/2016  . Weight gain with edema 11/14/2016  . Other headache syndrome 11/07/2016  . Hyponatremia 10/21/2016  . Prediabetes 08/08/2016  . Irritable bowel syndrome with constipation and diarrhea 01/20/2015  . Obesity, morbid ([place]) 01/20/2015  . Adult BMI 35.0-35.9 kg/sq m 12/24/2014  . Obstructive sleep apnea syndrome 12/03/2014  . Multiple joint pain 07/22/2014  . BRCA negative 01/20/2014  . Malignant neoplasm of right female breast ([place]) 12/15/2013   Pura Spice, PT, DPT # [id number] 11/21/2019, 8:45 AM  [facility] [place] [facility] [address] [place], [place], [address] Phone: [phone]   Fax:  [phone]  Name: Karen [name] MRN: [id number] Date of Birth: 02/17/1951

## 2019-11-24 ENCOUNTER — Ambulatory Visit
Admission: RE | Admit: 2019-11-24 | Discharge: 2019-11-24 | Disposition: A | Discharge status: Home / Self Care | Payer: Medicare Other | Source: Ambulatory Visit | Attending: Gastroenterology | Admitting: Gastroenterology

## 2019-11-24 ENCOUNTER — Other Ambulatory Visit: Payer: Self-pay

## 2019-11-24 DIAGNOSIS — R1084 Generalized abdominal pain: Secondary | ICD-10-CM | POA: Insufficient documentation

## 2019-11-24 DIAGNOSIS — K8689 Other specified diseases of pancreas: Secondary | ICD-10-CM | POA: Diagnosis present

## 2019-11-24 MED ORDER — IOHEXOL 300 MG/ML  SOLN
125.0000 mL | Freq: Once | INTRAMUSCULAR | Status: AC | PRN
  Administered 2019-11-24: 125 mL via INTRAVENOUS

## 2019-11-25 ENCOUNTER — Ambulatory Visit: Payer: Medicare Other | Admitting: Physical Therapy

## 2019-11-25 ENCOUNTER — Encounter: Payer: Self-pay | Admitting: Physical Therapy

## 2019-11-25 ENCOUNTER — Other Ambulatory Visit: Payer: Self-pay

## 2019-11-25 DIAGNOSIS — R2681 Unsteadiness on feet: Secondary | ICD-10-CM

## 2019-11-25 DIAGNOSIS — G8929 Other chronic pain: Secondary | ICD-10-CM

## 2019-11-25 DIAGNOSIS — R262 Difficulty in walking, not elsewhere classified: Secondary | ICD-10-CM

## 2019-11-25 DIAGNOSIS — M6281 Muscle weakness (generalized): Secondary | ICD-10-CM

## 2019-11-25 DIAGNOSIS — M25562 Pain in left knee: Secondary | ICD-10-CM

## 2019-11-25 NOTE — Patient Instructions (Signed)
Access Code: [email or web address]: [email or web address]: 06/08/2021Prepared by: Legrand Como SherkExercises  Standing Hip Abduction - 1 x daily - 5 x weekly - 3 sets - 10 reps  Hip and Knee Flexion with Anchored Resistance - 1 x daily - 5 x weekly - 3 sets - 10 reps  Standing Repeated Hip Extension with Resistance - 1 x daily - 5 x weekly - 3 sets - 10 reps  Sidestepping in Squat with Resistance and Arms Forward - 1 x daily - 5 x weekly - 3 sets - 10 reps  Tandem Stance with Support - 1 x daily - 5 x weekly - 3 sets - 10 reps - 30 hold  Seated Hip Abduction with Resistance - 1 x daily - 5 x weekly - 3 sets - 10 reps

## 2019-11-25 NOTE — Therapy (Signed)
[facility] [address] [place], [place], [address] Phone: [phone]   Fax:  [phone]  Physical Therapy Treatment  Patient Details  Name: Karen [name] MRN: [id number] Date of Birth: [birth year] No data recorded  Encounter Date: 11/25/2019  PT End of Session - 11/25/19 0738    Visit Number  3    Number of Visits  16    Date for PT Re-Evaluation  01/13/20    Authorization Type  3/10 (Medicare)    PT Start Time  0728    PT Stop Time  0825    PT Time Calculation (min)  57 min    Equipment Utilized During Treatment  --    Activity Tolerance  Patient tolerated treatment well;Patient limited by pain    Behavior During Therapy  [facility] for tasks assessed/performed       Past Medical History:  Diagnosis Date  . Acquired trigger finger of both middle fingers   . Arthritis   . Breast cancer, right ([place]) 2011   Right Lumpectomy and Rad tx's.  . Depression   . GERD (gastroesophageal reflux disease)   . Hyperlipidemia   . Hypertension   . Hypothyroidism   . Narcolepsy and cataplexy   . Neuropathy   . Osteoporosis, post-menopausal   . RLS (restless legs syndrome)   . Sleep apnea     Past Surgical History:  Procedure Laterality Date  . ABDOMINAL HYSTERECTOMY    . APPENDECTOMY    . BREAST SURGERY    . CARDIAC CATHETERIZATION N/A 11/25/2014   Procedure: Left Heart Cath;  Surgeon: Teodoro Spray, MD;  Location: [place] [facility];  Service: Cardiovascular;  Laterality: N/A;  . CHOLECYSTECTOMY    . COLONOSCOPY WITH PROPOFOL N/A 08/05/2018   Procedure: COLONOSCOPY WITH PROPOFOL;  Surgeon: Lollie Sails, MD;  Location: [facility] ENDOSCOPY;  Service: Endoscopy;  Laterality: N/A;  . MASTECTOMY Right    partial/lumpectomy    There were no vitals filed for this visit.  Subjective Assessment - 11/25/19 0736    Subjective  Pt. scheduled for MRI on Monday 6/14 for knee.  Pt. started Prednisone yesterday.    Pertinent History   Patient is returning to therapy for balance and difficulty walking, unsteadiness. Stated it has worsened since stopping therapy. 2 falls in the last 6 months. Fell asleep standing up for first fall, two weeks ago pt did not see that there was a step down and fell. Pt reported that she hurt both knees, L more than R, difficulty bending L knee. Pt complained of pain with turning her head to the L and her L arm goes numb when she lifts it up. 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 Pt was in therapy last July, said therapy was helpful. Pt will be getting a CT scan of abdomen next week due to enzyme (pancreatic). Pt would like L knee pain to be addressed in therapy as well. Describes her LE neuropathy symptoms as tightness, numbness pt reported its in her feet and ankles but can travel up her shin intermittently, when it hurts badly extends to just below her knee.    Limitations  Standing;Walking;House hold activities    How long can you sit comfortably?  NA if comfortable chair    How long can you stand comfortably?  not more than 5 minutes    How long can you walk comfortably?  Pt reported she is able to walk around the grocery store, (use of grocery cart)    Patient Stated Goals  improve balance    Currently in Pain?  Yes    Pain Score  2     Pain Location  Knee    Pain Orientation  Left    Pain Descriptors / Indicators  Aching    Pain Type  Chronic pain    Pain Score  7    Pain Location  Hip    Pain Orientation  Left        There.ex.:  NustepL0 10 min. B UE/LE.   See new HEP (handouts issued) Supine R/L generalized hip/LE stretches (L knee flexion/ manual tx. Limited by pain)- (+) L knee joint/hip tenderness/ hypomobility. Walking in PT clinic with focus on posture/ preventing lateral sway.  Pt. Has limited arm swing (mirror feedback).      PT Education - 11/25/19 0844    Education Details  See new HEP (issued RTB/  GTB)    Person(s) Educated  Patient    Methods  Explanation;Demonstration;Handout    Comprehension  Verbalized understanding;Returned demonstration       PT Short Term Goals - 11/18/19 0828      PT SHORT TERM GOAL #1   Title  Pt will be independent with HEP in order to improve strength and balance in order to decrease fall risk and improve function at home and work.    Baseline  Not initiated    Time  4    Period  Weeks    Status  New    Target Date  12/16/19        PT Long Term Goals - 11/18/19 0828      PT LONG TERM GOAL #1   Title  Patient will improve BERG by at least 3 points in order to demonstrate clinically significant improvement in balance.      Baseline  6/1 45/56    Time  8    Period  Weeks    Status  New    Target Date  01/13/20      PT LONG TERM GOAL #2   Title  Patient will decrease 5TSTS by at least 3 seconds in order to demonstrate clinically significant improvement in LE strength and decrease risk of falls.    Baseline  6/1 27 seconds with BUE on thighs    Time  8    Period  Weeks    Status  New    Target Date  01/13/20      PT LONG TERM GOAL #3   Title  Pt will demonstrate step over step stair navigation with unilateral UE support    Baseline  unable, step to pattern    Time  8    Period  Weeks    Target Date  01/13/20      PT LONG TERM GOAL #4   Title  Pt will score at least 10 points higher on FOTO score indicating improved functional abilities and activity tolerance.    Baseline  6/1: 40    Time  8    Period  Weeks    Status  New    Target Date  01/13/20            Plan - 11/25/19 0742    Clinical Impression Statement  Pt. pain limited with L hip/ knee ROM in standing and during supine stretches.  (+) L hip tenderness during supine trunk rotn.  Pt.  very guarded during hip/knee stretches resulting in increase c/o pain, esp. in L hip proximal ITB.  Improved upright posture/ gait noted during tx. as compared to last tx.  Pt. will  continue to benefit from heel lift in shoe.    Personal Factors and Comorbidities  Comorbidity 3+;Past/Current Experience;Fitness;Behavior Pattern;Age    Comorbidities  restless leg syndrome; lymphedema; hyperlipidemia; HTN; OA; breast cancer, narcolepsy, neuropathy, sleep apnea sees and MD for elevated RA markers    Examination-Activity Limitations  Bed Mobility;Lift;Bend;Sleep;Sit;Transfers;Reach Overhead;Stairs    Examination-Participation Restrictions  Cleaning;Laundry;Yard Work;Other;Community Activity;Driving    Stability/Clinical Decision Making  Evolving/Moderate complexity    Clinical Decision Making  Moderate    Rehab Potential  Fair    PT Frequency  2x / week    PT Duration  8 weeks    PT Treatment/Interventions  ADLs/Self Care Home Management;Moist Heat;Stair training;Gait training;Functional mobility training;Neuromuscular re-education;Balance training;Therapeutic exercise;Therapeutic activities;Patient/family education;Manual techniques;Taping;Dry needling;Cryotherapy    PT Next Visit Plan  Reassess HEP and progress as needed.  Balance activities.    PT Home Exercise Plan  gentle L knee ROM, icing    Consulted and Agree with Plan of Care  Patient       Patient will benefit from skilled therapeutic intervention in order to improve the following deficits and impairments:  Abnormal gait, Impaired sensation, Improper body mechanics, Pain, Decreased mobility, Postural dysfunction, Decreased activity tolerance, Decreased endurance, Decreased range of motion, Decreased strength, Decreased balance, Difficulty walking, Increased edema, Obesity, Increased muscle spasms, Impaired tone, Impaired flexibility  Visit Diagnosis: Difficulty in walking, not elsewhere classified  Muscle weakness (generalized)  Unsteadiness on feet  Acute pain of left knee  Chronic left hip pain     Problem List Patient Active Problem List   Diagnosis Date Noted  . Tachycardia 03/01/2018  . Benign  breast lumps 11/26/2017  . Cancer ([place]) 11/26/2017  . Depression 11/26/2017  . Hyperlipidemia 11/26/2017  . Hypertension 11/26/2017  . Osteoporosis, post-menopausal 11/26/2017  . Arthritis 03/26/2017  . Neuropathy 03/26/2017  . Leg pain, bilateral 03/26/2017  . Achilles tendinosis 03/26/2017  . RLS (restless legs syndrome) 03/26/2017  . Chronic deep vein thrombosis (DVT) of proximal vein of lower extremity ([place]) 03/26/2017  . Other insomnia 01/02/2017  . Chills without fever 12/26/2016  . Chronic pain of left heel 12/26/2016  . Increased band cell count 12/26/2016  . Vitamin D deficiency 11/14/2016  . Weight gain with edema 11/14/2016  . Other headache syndrome 11/07/2016  . Hyponatremia 10/21/2016  . Prediabetes 08/08/2016  . Irritable bowel syndrome with constipation and diarrhea 01/20/2015  . Obesity, morbid ([place]) 01/20/2015  . Adult BMI 35.0-35.9 kg/sq m 12/24/2014  . Obstructive sleep apnea syndrome 12/03/2014  . Multiple joint pain 07/22/2014  . BRCA negative 01/20/2014  . Malignant neoplasm of right female breast ([place]) 12/15/2013   Pura Spice, PT, DPT # [phone] 11/25/2019, 8:49 AM  [facility] [address] [place], [place], [address] Phone: [phone]   Fax:  [phone]  Name: ERIONNA STRUM MRN: [id number] Date of Birth: 1951/05/24

## 2019-11-27 ENCOUNTER — Encounter: Payer: Self-pay | Admitting: Physical Therapy

## 2019-11-27 ENCOUNTER — Other Ambulatory Visit: Payer: Self-pay

## 2019-11-27 ENCOUNTER — Ambulatory Visit: Payer: Medicare Other | Admitting: Physical Therapy

## 2019-11-27 DIAGNOSIS — M25552 Pain in left hip: Secondary | ICD-10-CM

## 2019-11-27 DIAGNOSIS — M25562 Pain in left knee: Secondary | ICD-10-CM

## 2019-11-27 DIAGNOSIS — R262 Difficulty in walking, not elsewhere classified: Secondary | ICD-10-CM

## 2019-11-27 DIAGNOSIS — R2681 Unsteadiness on feet: Secondary | ICD-10-CM

## 2019-11-27 DIAGNOSIS — G8929 Other chronic pain: Secondary | ICD-10-CM

## 2019-11-27 DIAGNOSIS — M6281 Muscle weakness (generalized): Secondary | ICD-10-CM

## 2019-11-27 NOTE — Therapy (Signed)
[facility] [address] [place], [place], [address] Phone: [phone]   Fax:  [phone]  Physical Therapy Treatment  Patient Details  Name: Karen [name] MRN: [id number] Date of Birth: [birth year] No data recorded  Encounter Date: 11/27/2019   PT End of Session - 11/27/19 0829    Visit Number 4    Number of Visits 16    Date for PT Re-Evaluation 01/13/20    Authorization Type 4/10 (Medicare)    PT Start Time 0730    PT Stop Time 0817    PT Time Calculation (min) 47 min    Activity Tolerance Patient tolerated treatment well;Patient limited by pain    Behavior During Therapy [facility] for tasks assessed/performed           Past Medical History:  Diagnosis Date  . Acquired trigger finger of both middle fingers   . Arthritis   . Breast cancer, right ([place]) 2011   Right Lumpectomy and Rad tx's.  . Depression   . GERD (gastroesophageal reflux disease)   . Hyperlipidemia   . Hypertension   . Hypothyroidism   . Narcolepsy and cataplexy   . Neuropathy   . Osteoporosis, post-menopausal   . RLS (restless legs syndrome)   . Sleep apnea     Past Surgical History:  Procedure Laterality Date  . ABDOMINAL HYSTERECTOMY    . APPENDECTOMY    . BREAST SURGERY    . CARDIAC CATHETERIZATION N/A 11/25/2014   Procedure: Left Heart Cath;  Surgeon: Teodoro Spray, MD;  Location: [place] [facility];  Service: Cardiovascular;  Laterality: N/A;  . CHOLECYSTECTOMY    . COLONOSCOPY WITH PROPOFOL N/A 08/05/2018   Procedure: COLONOSCOPY WITH PROPOFOL;  Surgeon: Lollie Sails, MD;  Location: [facility];  Service: Endoscopy;  Laterality: N/A;  . MASTECTOMY Right    partial/lumpectomy    There were no vitals filed for this visit.   Subjective Assessment - 11/27/19 0729    Subjective Pt reports "feeling great" this morning. Pt reports no pain in L knee prior to treatment this am. Pt is receiving MRI on L knee this monday. Pt reports  no issues walking around [place] after last PT session.    Pertinent History Patient is returning to therapy for balance and difficulty walking, unsteadiness. Stated it has worsened since stopping therapy. 2 falls in the last 6 months. Fell asleep standing up for first fall, two weeks ago pt did not see that there was a step down and fell. Pt reported that she hurt both knees, L more than R, difficulty bending L knee. Pt complained of pain with turning her head to the L and her L arm goes numb when she lifts it up. 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 Pt was in therapy last July, said therapy was helpful. Pt will be getting a CT scan of abdomen next week due to enzyme (pancreatic). Pt would like L knee pain to be addressed in therapy as well. Describes her LE neuropathy symptoms as tightness, numbness pt reported its in her feet and ankles but can travel up her shin intermittently, when it hurts badly extends to just below her knee.    Limitations Standing;Walking;House hold activities    How long can you sit comfortably? NA if comfortable chair    How long can you stand comfortably? not more than 5 minutes  How long can you walk comfortably? Pt reported she is able to walk around the grocery store, (use of grocery cart)    Currently in Pain? No/denies    Pain Score 0-No pain           Reviewed HEP  There.ex:   NuStep L3, 10 min, Seat setting:12, Arm setting: 11.  Standing hip abduction Green TB: 2x12 reps  Standing hip extension Green TB: 2x12 reps Bilateral 3" step-ups: finger support as needed, 2x12 reps  Neuro:  Tandem stance with B finger touch support in // bars: x4 Airex pad step  (front/sideways) in // bars: 1x4/1x4  Vitals: 130/90 mm Hg, HR: 79 BPM, SPO2: 97%      PT Short Term Goals - 11/18/19 7341      PT SHORT TERM GOAL #1   Title Pt will be independent with HEP in order to improve strength and  balance in order to decrease fall risk and improve function at home and work.    Baseline Not initiated    Time 4    Period Weeks    Status New    Target Date 12/16/19             PT Long Term Goals - 11/18/19 0828      PT LONG TERM GOAL #1   Title Patient will improve BERG by at least 3 points in order to demonstrate clinically significant improvement in balance.      Baseline 6/1 45/56    Time 8    Period Weeks    Status New    Target Date 01/13/20      PT LONG TERM GOAL #2   Title Patient will decrease 5TSTS by at least 3 seconds in order to demonstrate clinically significant improvement in LE strength and decrease risk of falls.    Baseline 6/1 27 seconds with BUE on thighs    Time 8    Period Weeks    Status New    Target Date 01/13/20      PT LONG TERM GOAL #3   Title Pt will demonstrate step over step stair navigation with unilateral UE support    Baseline unable, step to pattern    Time 8    Period Weeks    Target Date 01/13/20      PT LONG TERM GOAL #4   Title Pt will score at least 10 points higher on FOTO score indicating improved functional abilities and activity tolerance.    Baseline 6/1: 40    Time 8    Period Weeks    Status New    Target Date 01/13/20              Plan - 11/27/19 0830    Clinical Impression Statement Pt ambulated into clinic with no c/o L knee pain. Pt able to perform resisted hip/knee exercises with green TB with no pain. Pt tolerated resisted walking at [place] at 60 lbs with no increase in L knee pain symptoms and tolerated 3" step ups challenging ankle/knee/ hip stability. Pt displayed challenge to balance requiring min Tactile cues and use of finger touch support in // bars. Pt can continue to benefit from skilled PT treatment to further improve BLE strength and dynamic balance to improve independence with ADL's.    Personal Factors and Comorbidities Comorbidity 3+;Past/Current Experience;Fitness;Behavior Pattern;Age     Comorbidities restless leg syndrome; lymphedema; hyperlipidemia; HTN; OA; breast cancer, narcolepsy, neuropathy, sleep apnea sees and MD for elevated RA markers  Examination-Activity Limitations Bed Mobility;Lift;Bend;Sleep;Sit;Transfers;Reach Overhead;Stairs    Examination-Participation Restrictions Cleaning;Laundry;Yard Work;Other;Community Activity;Driving    Stability/Clinical Decision Making Evolving/Moderate complexity    Rehab Potential Fair    PT Frequency 2x / week    PT Duration 8 weeks    PT Treatment/Interventions ADLs/Self Care Home Management;Moist Heat;Stair training;Gait training;Functional mobility training;Neuromuscular re-education;Balance training;Therapeutic exercise;Therapeutic activities;Patient/family education;Manual techniques;Taping;Dry needling;Cryotherapy    PT Next Visit Plan Reassess HEP and progress as needed.  Balance activities.    PT Home Exercise Plan gentle L knee ROM, icing    Consulted and Agree with Plan of Care Patient           Patient will benefit from skilled therapeutic intervention in order to improve the following deficits and impairments:  Abnormal gait, Impaired sensation, Improper body mechanics, Pain, Decreased mobility, Postural dysfunction, Decreased activity tolerance, Decreased endurance, Decreased range of motion, Decreased strength, Decreased balance, Difficulty walking, Increased edema, Obesity, Increased muscle spasms, Impaired tone, Impaired flexibility  Visit Diagnosis: Difficulty in walking, not elsewhere classified  Muscle weakness (generalized)  Unsteadiness on feet  Acute pain of left knee  Chronic left hip pain     Problem List Patient Active Problem List   Diagnosis Date Noted  . Tachycardia 03/01/2018  . Benign breast lumps 11/26/2017  . Cancer ([place]) 11/26/2017  . Depression 11/26/2017  . Hyperlipidemia 11/26/2017  . Hypertension 11/26/2017  . Osteoporosis, post-menopausal 11/26/2017  . Arthritis  03/26/2017  . Neuropathy 03/26/2017  . Leg pain, bilateral 03/26/2017  . Achilles tendinosis 03/26/2017  . RLS (restless legs syndrome) 03/26/2017  . Chronic deep vein thrombosis (DVT) of proximal vein of lower extremity ([place]) 03/26/2017  . Other insomnia 01/02/2017  . Chills without fever 12/26/2016  . Chronic pain of left heel 12/26/2016  . Increased band cell count 12/26/2016  . Vitamin D deficiency 11/14/2016  . Weight gain with edema 11/14/2016  . Other headache syndrome 11/07/2016  . Hyponatremia 10/21/2016  . Prediabetes 08/08/2016  . Irritable bowel syndrome with constipation and diarrhea 01/20/2015  . Obesity, morbid ([place]) 01/20/2015  . Adult BMI 35.0-35.9 kg/sq m 12/24/2014  . Obstructive sleep apnea syndrome 12/03/2014  . Multiple joint pain 07/22/2014  . BRCA negative 01/20/2014  . Malignant neoplasm of right female breast ([place]) 12/15/2013   Pura Spice, PT, DPT # [phone] 11/27/2019, 2:40 PM  [facility] [address] [place], [place], [address] Phone: [phone]   Fax:  [phone]  Name: CHEQUITA MOFIELD MRN: [id number] Date of Birth: Feb 14, 1951

## 2019-12-02 ENCOUNTER — Other Ambulatory Visit: Payer: Self-pay

## 2019-12-02 ENCOUNTER — Encounter: Payer: Self-pay | Admitting: Physical Therapy

## 2019-12-02 ENCOUNTER — Ambulatory Visit: Payer: Medicare Other | Admitting: Physical Therapy

## 2019-12-02 DIAGNOSIS — M25562 Pain in left knee: Secondary | ICD-10-CM

## 2019-12-02 DIAGNOSIS — M6281 Muscle weakness (generalized): Secondary | ICD-10-CM

## 2019-12-02 DIAGNOSIS — R262 Difficulty in walking, not elsewhere classified: Secondary | ICD-10-CM | POA: Diagnosis not present

## 2019-12-02 DIAGNOSIS — G8929 Other chronic pain: Secondary | ICD-10-CM

## 2019-12-02 DIAGNOSIS — R2681 Unsteadiness on feet: Secondary | ICD-10-CM

## 2019-12-02 DIAGNOSIS — M25552 Pain in left hip: Secondary | ICD-10-CM

## 2019-12-02 NOTE — Therapy (Signed)
[facility] [address] [place], [place], [address] Phone: [phone]   Fax:  [phone]  Physical Therapy Treatment  Patient Details  Name: Karen [name] MRN: [id number] Date of Birth: 26-Feb-1951 No data recorded  Encounter Date: 12/02/2019   PT End of Session - 12/02/19 0737    Visit Number 5    Number of Visits 16    Date for PT Re-Evaluation 01/13/20    Authorization Type 5/10 (Medicare)    PT Start Time 0727    PT Stop Time 0816    PT Time Calculation (min) 49 min    Activity Tolerance Patient tolerated treatment well;Patient limited by pain    Behavior During Therapy [facility] for tasks assessed/performed           Past Medical History:  Diagnosis Date   Acquired trigger finger of both middle fingers    Arthritis    Breast cancer, right ([place]) 2011   Right Lumpectomy and Rad tx's.   Depression    GERD (gastroesophageal reflux disease)    Hyperlipidemia    Hypertension    Hypothyroidism    Narcolepsy and cataplexy    Neuropathy    Osteoporosis, post-menopausal    RLS (restless legs syndrome)    Sleep apnea     Past Surgical History:  Procedure Laterality Date   ABDOMINAL HYSTERECTOMY     APPENDECTOMY     BREAST SURGERY     CARDIAC CATHETERIZATION N/A 11/25/2014   Procedure: Left Heart Cath;  Surgeon: Teodoro Spray, MD;  Location: [place] [facility];  Service: Cardiovascular;  Laterality: N/A;   CHOLECYSTECTOMY     COLONOSCOPY WITH PROPOFOL N/A 08/05/2018   Procedure: COLONOSCOPY WITH PROPOFOL;  Surgeon: Lollie Sails, MD;  [facility]: [facility] END[facility];  Service: Endoscopy;  Laterality: N/A;   MASTECTOMY Right    partial/lumpectomy    There were no vitals filed for this visit.   Subjective Assessment - 12/02/19 0734    Subjective Pt. had MRI on L knee yesterday (see imaging results).  Pt. c/o 4/10 L knee pain currently but 10/10 L knee pain yesterday.  Pt. states she only slept 2  hours last night.    Pertinent History Patient is returning to therapy for balance and difficulty walking, unsteadiness. Stated it has worsened since stopping therapy. 2 falls in the last 6 months. Fell asleep standing up for first fall, two weeks ago pt did not see that there was a step down and fell. Pt reported that she hurt both knees, L more than R, difficulty bending L knee. Pt complained of pain with turning her head to the L and her L arm goes numb when she lifts it up. 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 Pt was in therapy last July, said therapy was helpful. Pt will be getting a CT scan of abdomen next week due to enzyme (pancreatic). Pt would like L knee pain to be addressed in therapy as well. Describes her LE neuropathy symptoms as tightness, numbness pt reported its in her feet and ankles but can travel up her shin intermittently, when it hurts badly extends to just below her knee.    Limitations Standing;Walking;House hold activities    How long can you sit comfortably? NA if comfortable chair    How long can you stand comfortably? not more than 5 minutes    How long can you  walk comfortably? Pt reported she is able to walk around the grocery store, (use of grocery cart)    Patient Stated Goals improve balance    Currently in Pain? Yes    Pain Score 5     Pain Location Knee    Pain Orientation Left    Pain Descriptors / Indicators Aching            Discussed MRI   There.ex:   Supine L/R hamstring and knee to chest manual stretches 3x each (static holds as tolerated).  NuStep L3, 10 min, Seat setting:12, Arm setting: 11.  Resisted 1BTB (all 4-planes)- no UE assist.   Neuro:  Tandem stance/ gait with light to no UE assist in // bars: x4 (mirror feedback to correct posture).   Airex pad stance: ball toss in //-bars (varying positions).   Standing step touches/hovering on RTB in //-bars (no UE  assist).        PT Short Term Goals - 11/18/19 4497      PT SHORT TERM GOAL #1   Title Pt will be independent with HEP in order to improve strength and balance in order to decrease fall risk and improve function at home and work.    Baseline Not initiated    Time 4    Period Weeks    Status New    Target Date 12/16/19             PT Long Term Goals - 11/18/19 0828      PT LONG TERM GOAL #1   Title Patient will improve BERG by at least 3 points in order to demonstrate clinically significant improvement in balance.      Baseline 6/1 45/56    Time 8    Period Weeks    Status New    Target Date 01/13/20      PT LONG TERM GOAL #2   Title Patient will decrease 5TSTS by at least 3 seconds in order to demonstrate clinically significant improvement in LE strength and decrease risk of falls.    Baseline 6/1 27 seconds with BUE on thighs    Time 8    Period Weeks    Status New    Target Date 01/13/20      PT LONG TERM GOAL #3   Title Pt will demonstrate step over step stair navigation with unilateral UE support    Baseline unable, step to pattern    Time 8    Period Weeks    Target Date 01/13/20      PT LONG TERM GOAL #4   Title Pt will score at least 10 points higher on FOTO score indicating improved functional abilities and activity tolerance.    Baseline 6/1: 40    Time 8    Period Weeks    Status New    Target Date 01/13/20                 Plan - 12/02/19 0810    Clinical Impression Statement Pt. able to complete // bar balance exercises to strengthen ankle strategy. Pt. completed resisted walking, able to tolerate forward, backwards, and left side stepping. Right resisted sided stepping was not tolerated well, with increasing knee pain. Pt. will return to MD in early July. Pt. will continue to benefit from skilled PT to address L strength deficits and improve balance.    Personal Factors and Comorbidities Comorbidity 3+;Past/Current Experience;Fitness;Behavior  Pattern;Age    Comorbidities restless leg syndrome; lymphedema; hyperlipidemia; HTN;  OA; breast cancer, narcolepsy, neuropathy, sleep apnea sees and MD for elevated RA markers    Examination-Activity Limitations Bed Mobility;Lift;Bend;Sleep;Sit;Transfers;Reach Overhead;Stairs    Examination-Participation Restrictions Cleaning;Laundry;Yard Work;Other;Community Activity;Driving    Stability/Clinical Decision Making Evolving/Moderate complexity    Clinical Decision Making Moderate    Rehab Potential Fair    PT Frequency 2x / week    PT Duration 8 weeks    PT Treatment/Interventions ADLs/Self Care Home Management;Moist Heat;Stair training;Gait training;Functional mobility training;Neuromuscular re-education;Balance training;Therapeutic exercise;Therapeutic activities;Patient/family education;Manual techniques;Taping;Dry needling;Cryotherapy    PT Next Visit Plan Reassess HEP and progress as needed.  Balance activities.    PT Home Exercise Plan gentle L knee ROM, icing    Consulted and Agree with Plan of Care Patient           Patient will benefit from skilled therapeutic intervention in order to improve the following deficits and impairments:  Abnormal gait, Impaired sensation, Improper body mechanics, Pain, Decreased mobility, Postural dysfunction, Decreased activity tolerance, Decreased endurance, Decreased range of motion, Decreased strength, Decreased balance, Difficulty walking, Increased edema, Obesity, Increased muscle spasms, Impaired tone, Impaired flexibility  Visit Diagnosis: Difficulty in walking, not elsewhere classified  Muscle weakness (generalized)  Unsteadiness on feet  Acute pain of left knee  Chronic left hip pain     Problem List Patient Active Problem List   Diagnosis Date Noted   Tachycardia 03/01/2018   Benign breast lumps 11/26/2017   Cancer ([place]) 11/26/2017   Depression 11/26/2017   Hyperlipidemia 11/26/2017   Hypertension 11/26/2017    Osteoporosis, post-menopausal 11/26/2017   Arthritis 03/26/2017   Neuropathy 03/26/2017   Leg pain, bilateral 03/26/2017   Achilles tendinosis 03/26/2017   RLS (restless legs syndrome) 03/26/2017   Chronic deep vein thrombosis (DVT) of proximal vein of lower extremity ([facility]) 03/26/2017   Other insomnia 01/02/2017   Chills without fever 12/26/2016   Chronic pain of left heel 12/26/2016   Increased band cell count 12/26/2016   Vitamin D deficiency 11/14/2016   Weight gain with edema 11/14/2016   Other headache syndrome 11/07/2016   Hyponatremia 10/21/2016   Prediabetes 08/08/2016   Irritable bowel syndrome with constipation and diarrhea 01/20/2015   Obesity, morbid ([place]) 01/20/2015   Adult BMI 35.0-35.9 kg/sq m 12/24/2014   Obstructive sleep apnea syndrome 12/03/2014   Multiple joint pain 07/22/2014   BRCA negative 01/20/2014   Malignant neoplasm of right female breast ([place]) 12/15/2013   Pura Spice, PT, DPT # [phone] 12/02/2019, 9:05 AM  [facility] [address] [place], [place], [address] Phone: [phone]   Fax:  [phone]  Name: Karen [name] MRN: [id number] Date of Birth: [birth year]

## 2019-12-04 ENCOUNTER — Encounter: Payer: Self-pay | Admitting: Physical Therapy

## 2019-12-04 ENCOUNTER — Ambulatory Visit: Payer: Medicare Other | Admitting: Physical Therapy

## 2019-12-04 ENCOUNTER — Other Ambulatory Visit: Payer: Self-pay

## 2019-12-04 DIAGNOSIS — G8929 Other chronic pain: Secondary | ICD-10-CM

## 2019-12-04 DIAGNOSIS — R2681 Unsteadiness on feet: Secondary | ICD-10-CM

## 2019-12-04 DIAGNOSIS — M25562 Pain in left knee: Secondary | ICD-10-CM

## 2019-12-04 DIAGNOSIS — R262 Difficulty in walking, not elsewhere classified: Secondary | ICD-10-CM | POA: Diagnosis not present

## 2019-12-04 DIAGNOSIS — M25552 Pain in left hip: Secondary | ICD-10-CM

## 2019-12-04 DIAGNOSIS — M6281 Muscle weakness (generalized): Secondary | ICD-10-CM

## 2019-12-04 NOTE — Therapy (Signed)
[facility] [address] [place], [place], [address] Phone: [phone]   Fax:  [phone]  Physical Therapy Treatment  Patient Details  Name: Karen [name] MRN: [id number] Date of Birth: 12-11-1950 No data recorded  Encounter Date: 12/04/2019   PT End of Session - 12/04/19 0737    Visit Number 6    Number of Visits 16    Date for PT Re-Evaluation 01/13/20    Authorization Type 6/10 (Medicare)    PT Start Time 0726    PT Stop Time 0828    PT Time Calculation (min) 62 min    Activity Tolerance Patient tolerated treatment well;Patient limited by pain    Behavior During Therapy [facility] for tasks assessed/performed           Past Medical History:  Diagnosis Date  . Acquired trigger finger of both middle fingers   . Arthritis   . Breast cancer, right ([place]) 2011   Right Lumpectomy and Rad tx's.  . Depression   . GERD (gastroesophageal reflux disease)   . Hyperlipidemia   . Hypertension   . Hypothyroidism   . Narcolepsy and cataplexy   . Neuropathy   . Osteoporosis, post-menopausal   . RLS (restless legs syndrome)   . Sleep apnea     Past Surgical History:  Procedure Laterality Date  . ABDOMINAL HYSTERECTOMY    . APPENDECTOMY    . BREAST SURGERY    . CARDIAC CATHETERIZATION N/A 11/25/2014   Procedure: Left Heart Cath;  Surgeon: Teodoro Spray, MD;  Location: [place] [facility];  Service: Cardiovascular;  Laterality: N/A;  . CHOLECYSTECTOMY    . COLONOSCOPY WITH PROPOFOL N/A 08/05/2018   Procedure: COLONOSCOPY WITH PROPOFOL;  Surgeon: Lollie Sails, MD;  Location: [facility] ENDOSCOPY;  Service: Endoscopy;  Laterality: N/A;  . MASTECTOMY Right    partial/lumpectomy    There were no vitals filed for this visit.   Subjective Assessment - 12/04/19 0734    Subjective Pt. feeling better since last visit, pain is at a 4/10. Getting in the car is still tough, as well as long distance walking. Pt. states that she only  slept 2 hours last night. Pt. states that her left foot is red and swollen, pt. noticed symptoms Tuesday night wiht sx. She woke up in the middle of the night with burning pain and foot was hot.    Pertinent History Patient is returning to therapy for balance and difficulty walking, unsteadiness. Stated it has worsened since stopping therapy. 2 falls in the last 6 months. Fell asleep standing up for first fall, two weeks ago pt did not see that there was a step down and fell. Pt reported that she hurt both knees, L more than R, difficulty bending L knee. Pt complained of pain with turning her head to the L and her L arm goes numb when she lifts it up. 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 Pt was in therapy last July, said therapy was helpful. Pt will be getting a CT scan of abdomen next week due to enzyme (pancreatic). Pt would like L knee pain to be addressed in therapy as well. Describes her LE neuropathy symptoms as tightness, numbness pt reported its in her feet and ankles but can travel up her shin intermittently, when it hurts badly extends to just below her knee.    How long can you sit  comfortably? NA if comfortable chair    How long can you stand comfortably? not more than 5 minutes    How long can you walk comfortably? Pt reported she is able to walk around the grocery store, (use of grocery cart)    Diagnostic tests nerve conduction > 3 years ago    Patient Stated Goals improve balance    Currently in Pain? Yes    Pain Score 4     Pain Location Knee    Pain Orientation Left           Ther. Ex:  Nustep 10 mins, discussed knee pain and plan  TRX:  12x STS with airex in chair  10x STS with no airex in chair  // bars: 3" heel taps: 2x10 reps each alternating foot  6" step overs: alternating lead leg 10x each  Nautilus:  Forward/backwards walking: 50lbs single pulley  Lateral walking: 40lbs single  pulley       PT Short Term Goals - 11/18/19 0828      PT SHORT TERM GOAL #1   Title Pt will be independent with HEP in order to improve strength and balance in order to decrease fall risk and improve function at home and work.    Baseline Not initiated    Time 4    Period Weeks    Status New    Target Date 12/16/19             PT Long Term Goals - 11/18/19 0828      PT LONG TERM GOAL #1   Title Patient will improve BERG by at least 3 points in order to demonstrate clinically significant improvement in balance.      Baseline 6/1 45/56    Time 8    Period Weeks    Status New    Target Date 01/13/20      PT LONG TERM GOAL #2   Title Patient will decrease 5TSTS by at least 3 seconds in order to demonstrate clinically significant improvement in LE strength and decrease risk of falls.    Baseline 6/1 27 seconds with BUE on thighs    Time 8    Period Weeks    Status New    Target Date 01/13/20      PT LONG TERM GOAL #3   Title Pt will demonstrate step over step stair navigation with unilateral UE support    Baseline unable, step to pattern    Time 8    Period Weeks    Target Date 01/13/20      PT LONG TERM GOAL #4   Title Pt will score at least 10 points higher on FOTO score indicating improved functional abilities and activity tolerance.    Baseline 6/1: 40    Time 8    Period Weeks    Status New    Target Date 01/13/20                 Plan - 12/04/19 0841    Clinical Impression Statement Pt. able to complete hip and quad strengthening exercises with good form and control today. Pt. completed resisted walking forwards, backwards, and bilateral sidestepping in Nautilus and tolerated exercises well with no increase in knee pain. Pt. described new onset L toe swelling as of Tuesday, has grade 2 pitting edema in foot with redness. Pt. denies pain, and it is not getting worse. Pt. was prescribed meloxicam and it helped the swelling a little, and has called her  rheumatologist yesterday. Pt. would continue to benefit from skilled PT to address strength deficits and decrease pain.    Personal Factors and Comorbidities Comorbidity 3+;Past/Current Experience;Fitness;Behavior Pattern;Age    Comorbidities restless leg syndrome; lymphedema; hyperlipidemia; HTN; OA; breast cancer, narcolepsy, neuropathy, sleep apnea sees and MD for elevated RA markers    Examination-Activity Limitations Bed Mobility;Lift;Bend;Sleep;Sit;Transfers;Reach Overhead;Stairs    Examination-Participation Restrictions Cleaning;Laundry;Yard Work;Other;Community Activity;Driving    Stability/Clinical Decision Making Evolving/Moderate complexity    Clinical Decision Making Moderate    Rehab Potential Fair    PT Frequency 2x / week    PT Duration 8 weeks    PT Treatment/Interventions ADLs/Self Care Home Management;Moist Heat;Stair training;Gait training;Functional mobility training;Neuromuscular re-education;Balance training;Therapeutic exercise;Therapeutic activities;Patient/family education;Manual techniques;Taping;Dry needling;Cryotherapy    PT Next Visit Plan Reassess HEP and progress as needed.  Balance activities.    PT Home Exercise Plan gentle L knee ROM, icing    Consulted and Agree with Plan of Care Patient           Patient will benefit from skilled therapeutic intervention in order to improve the following deficits and impairments:  Abnormal gait, Impaired sensation, Improper body mechanics, Pain, Decreased mobility, Postural dysfunction, Decreased activity tolerance, Decreased endurance, Decreased range of motion, Decreased strength, Decreased balance, Difficulty walking, Increased edema, Obesity, Increased muscle spasms, Impaired tone, Impaired flexibility  Visit Diagnosis: Difficulty in walking, not elsewhere classified  Muscle weakness (generalized)  Acute pain of left knee  Unsteadiness on feet  Chronic left hip pain     Problem List Patient Active Problem  List   Diagnosis Date Noted  . Tachycardia 03/01/2018  . Benign breast lumps 11/26/2017  . Cancer ([place]) 11/26/2017  . Depression 11/26/2017  . Hyperlipidemia 11/26/2017  . Hypertension 11/26/2017  . Osteoporosis, post-menopausal 11/26/2017  . Arthritis 03/26/2017  . Neuropathy 03/26/2017  . Leg pain, bilateral 03/26/2017  . Achilles tendinosis 03/26/2017  . RLS (restless legs syndrome) 03/26/2017  . Chronic deep vein thrombosis (DVT) of proximal vein of lower extremity ([place]) 03/26/2017  . Other insomnia 01/02/2017  . Chills without fever 12/26/2016  . Chronic pain of left heel 12/26/2016  . Increased band cell count 12/26/2016  . Vitamin D deficiency 11/14/2016  . Weight gain with edema 11/14/2016  . Other headache syndrome 11/07/2016  . Hyponatremia 10/21/2016  . Prediabetes 08/08/2016  . Irritable bowel syndrome with constipation and diarrhea 01/20/2015  . Obesity, morbid ([place]) 01/20/2015  . Adult BMI 35.0-35.9 kg/sq m 12/24/2014  . Obstructive sleep apnea syndrome 12/03/2014  . Multiple joint pain 07/22/2014  . BRCA negative 01/20/2014  . Malignant neoplasm of right female breast ([place]) 12/15/2013   Pura Spice, PT, DPT # [id number], SPT 12/04/2019, 9:20 AM  [facility] [address] [place], [place], [address] Phone: [phone]   Fax:  [phone]  Name: ENDIA MONCUR MRN: [id number] Date of Birth: [birth year]

## 2019-12-09 ENCOUNTER — Other Ambulatory Visit: Payer: Self-pay

## 2019-12-09 ENCOUNTER — Encounter: Payer: Self-pay | Admitting: Physical Therapy

## 2019-12-09 ENCOUNTER — Ambulatory Visit: Payer: Medicare Other | Admitting: Physical Therapy

## 2019-12-09 DIAGNOSIS — R262 Difficulty in walking, not elsewhere classified: Secondary | ICD-10-CM

## 2019-12-09 DIAGNOSIS — M25552 Pain in left hip: Secondary | ICD-10-CM

## 2019-12-09 DIAGNOSIS — R2681 Unsteadiness on feet: Secondary | ICD-10-CM

## 2019-12-09 DIAGNOSIS — G8929 Other chronic pain: Secondary | ICD-10-CM

## 2019-12-09 DIAGNOSIS — M6281 Muscle weakness (generalized): Secondary | ICD-10-CM

## 2019-12-09 DIAGNOSIS — M25562 Pain in left knee: Secondary | ICD-10-CM

## 2019-12-09 NOTE — Therapy (Signed)
[facility] [address] [place], [place], [address] Phone: [phone]   Fax:  [phone]  Physical Therapy Treatment  Patient Details  Name: Karen [name] MRN: [id number] Date of Birth: February 23, 1951 No data recorded  Encounter Date: 12/09/2019   PT End of Session - 12/09/19 0825    Visit Number 7    Number of Visits 16    Date for PT Re-Evaluation 01/13/20    Authorization Type 7/10 (Medicare)    PT Start Time 0738    PT Stop Time 0820    PT Time Calculation (min) 42 min    Activity Tolerance Patient tolerated treatment well;Patient limited by pain    Behavior During Therapy [facility] for tasks assessed/performed           Past Medical History:  Diagnosis Date  . Acquired trigger finger of both middle fingers   . Arthritis   . Breast cancer, right ([place]) 2011   Right Lumpectomy and Rad tx's.  . Depression   . GERD (gastroesophageal reflux disease)   . Hyperlipidemia   . Hypertension   . Hypothyroidism   . Narcolepsy and cataplexy   . Neuropathy   . Osteoporosis, post-menopausal   . RLS (restless legs syndrome)   . Sleep apnea     Past Surgical History:  Procedure Laterality Date  . ABDOMINAL HYSTERECTOMY    . APPENDECTOMY    . BREAST SURGERY    . CARDIAC CATHETERIZATION N/A 11/25/2014   Procedure: Left Heart Cath;  Surgeon: Teodoro Spray, MD;  Location: [place] [facility];  Service: Cardiovascular;  Laterality: N/A;  . CHOLECYSTECTOMY    . COLONOSCOPY WITH PROPOFOL N/A 08/05/2018   Procedure: COLONOSCOPY WITH PROPOFOL;  Surgeon: Lollie Sails, MD;  Location: [facility] ENDOSCOPY;  Service: Endoscopy;  Laterality: N/A;  . MASTECTOMY Right    partial/lumpectomy    There were no vitals filed for this visit.   Subjective Assessment - 12/09/19 0824    Subjective Pt reports redness and swelling on dorsum of her foot has dissipated. Pt states she is currently 5/10 NPS in L knee pain.    Pertinent History Patient  is returning to therapy for balance and difficulty walking, unsteadiness. Stated it has worsened since stopping therapy. 2 falls in the last 6 months. Fell asleep standing up for first fall, two weeks ago pt did not see that there was a step down and fell. Pt reported that she hurt both knees, L more than R, difficulty bending L knee. Pt complained of pain with turning her head to the L and her L arm goes numb when she lifts it up. 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 Pt was in therapy last July, said therapy was helpful. Pt will be getting a CT scan of abdomen next week due to enzyme (pancreatic). Pt would like L knee pain to be addressed in therapy as well. Describes her LE neuropathy symptoms as tightness, numbness pt reported its in her feet and ankles but can travel up her shin intermittently, when it hurts badly extends to just below her knee.    How long can you sit comfortably? NA if comfortable chair    How long can you stand comfortably? not more than 5 minutes    How long can you walk comfortably? Pt reported she is able to walk around the grocery store, (use of grocery cart)  Diagnostic tests nerve conduction > 3 years ago    Patient Stated Goals improve balance    Currently in Pain? Yes    Pain Score 5     Pain Location Knee    Pain Orientation Left          There.ex:   Nu-Step: L4 for 10 min with use of UE/LE STS with TRX for BUE use and airex pad on seat: 1x12 STS with BUE use on seat rails: 1x12, verbal cues to improve eccentric control  // bars: 4 lbs ankle weights B hip abduction 2x20, hip extensions 1x20 with tactile cues for upright posture to focus on isolating glut med and glut max.  // bars: 1x20 B heel raises     PT Short Term Goals - 11/18/19 0828      PT SHORT TERM GOAL #1   Title Pt will be independent with HEP in order to improve strength and balance in order to decrease fall risk and  improve function at home and work.    Baseline Not initiated    Time 4    Period Weeks    Status New    Target Date 12/16/19             PT Long Term Goals - 11/18/19 0828      PT LONG TERM GOAL #1   Title Patient will improve BERG by at least 3 points in order to demonstrate clinically significant improvement in balance.      Baseline 6/1 45/56    Time 8    Period Weeks    Status New    Target Date 01/13/20      PT LONG TERM GOAL #2   Title Patient will decrease 5TSTS by at least 3 seconds in order to demonstrate clinically significant improvement in LE strength and decrease risk of falls.    Baseline 6/1 27 seconds with BUE on thighs    Time 8    Period Weeks    Status New    Target Date 01/13/20      PT LONG TERM GOAL #3   Title Pt will demonstrate step over step stair navigation with unilateral UE support    Baseline unable, step to pattern    Time 8    Period Weeks    Target Date 01/13/20      PT LONG TERM GOAL #4   Title Pt will score at least 10 points higher on FOTO score indicating improved functional abilities and activity tolerance.    Baseline 6/1: 40    Time 8    Period Weeks    Status New    Target Date 01/13/20              Plan - 12/09/19 0826    Clinical Impression Statement Pt performed isolated hip musculature strengthening w/ 4 lbs ankle weights. Pt required min tactile cues on shoulders and hips to maintain upright psoture to isolate glut med and glut max. Pt performed STS's with airex pad on seat to elevate rising surface. Pt required mod verbal cues to focus on eccentric control upon descent. Pt used TRX for first set and then BUE use on handrails of chair. No increase in pain symptoms.    Personal Factors and Comorbidities Comorbidity 3+;Past/Current Experience;Fitness;Behavior Pattern;Age    Comorbidities restless leg syndrome; lymphedema; hyperlipidemia; HTN; OA; breast cancer, narcolepsy, neuropathy, sleep apnea sees and MD for elevated  RA markers    Examination-Activity Limitations Bed Mobility;Lift;Bend;Sleep;Sit;Transfers;Reach Overhead;Stairs  Examination-Participation Restrictions Cleaning;Laundry;Yard Work;Other;Community Activity;Driving    Stability/Clinical Decision Making Evolving/Moderate complexity    Rehab Potential Fair    PT Frequency 2x / week    PT Duration 8 weeks    PT Treatment/Interventions ADLs/Self Care Home Management;Moist Heat;Stair training;Gait training;Functional mobility training;Neuromuscular re-education;Balance training;Therapeutic exercise;Therapeutic activities;Patient/family education;Manual techniques;Taping;Dry needling;Cryotherapy    PT Next Visit Plan Reassess HEP and progress as needed.  Balance activities.    PT Home Exercise Plan gentle L knee ROM, icing    Consulted and Agree with Plan of Care Patient           Patient will benefit from skilled therapeutic intervention in order to improve the following deficits and impairments:  Abnormal gait, Impaired sensation, Improper body mechanics, Pain, Decreased mobility, Postural dysfunction, Decreased activity tolerance, Decreased endurance, Decreased range of motion, Decreased strength, Decreased balance, Difficulty walking, Increased edema, Obesity, Increased muscle spasms, Impaired tone, Impaired flexibility  Visit Diagnosis: Difficulty in walking, not elsewhere classified  Muscle weakness (generalized)  Acute pain of left knee  Unsteadiness on feet  Chronic left hip pain     Problem List Patient Active Problem List   Diagnosis Date Noted  . Tachycardia 03/01/2018  . Benign breast lumps 11/26/2017  . Cancer ([place]) 11/26/2017  . Depression 11/26/2017  . Hyperlipidemia 11/26/2017  . Hypertension 11/26/2017  . Osteoporosis, post-menopausal 11/26/2017  . Arthritis 03/26/2017  . Neuropathy 03/26/2017  . Leg pain, bilateral 03/26/2017  . Achilles tendinosis 03/26/2017  . RLS (restless legs syndrome) 03/26/2017  .  Chronic deep vein thrombosis (DVT) of proximal vein of lower extremity ([place]) 03/26/2017  . Other insomnia 01/02/2017  . Chills without fever 12/26/2016  . Chronic pain of left heel 12/26/2016  . Increased band cell count 12/26/2016  . Vitamin D deficiency 11/14/2016  . Weight gain with edema 11/14/2016  . Other headache syndrome 11/07/2016  . Hyponatremia 10/21/2016  . Prediabetes 08/08/2016  . Irritable bowel syndrome with constipation and diarrhea 01/20/2015  . Obesity, morbid ([place]) 01/20/2015  . Adult BMI 35.0-35.9 kg/sq m 12/24/2014  . Obstructive sleep apnea syndrome 12/03/2014  . Multiple joint pain 07/22/2014  . BRCA negative 01/20/2014  . Malignant neoplasm of right female breast ([place]) 12/15/2013   Pura Spice, PT, DPT # [id number], SPT 12/09/2019, 1:48 PM  [facility] [address]. [place], [place], [address] Phone: [phone]   Fax:  [phone]  Name: Karen [name] MRN: [id number] Date of Birth: Jun 10, 1951

## 2019-12-30 ENCOUNTER — Ambulatory Visit: Payer: Medicare Other | Attending: Neurology | Admitting: Physical Therapy

## 2019-12-30 ENCOUNTER — Other Ambulatory Visit: Payer: Self-pay

## 2019-12-30 DIAGNOSIS — M25562 Pain in left knee: Secondary | ICD-10-CM | POA: Diagnosis present

## 2019-12-30 DIAGNOSIS — R262 Difficulty in walking, not elsewhere classified: Secondary | ICD-10-CM | POA: Insufficient documentation

## 2019-12-30 DIAGNOSIS — R2681 Unsteadiness on feet: Secondary | ICD-10-CM | POA: Insufficient documentation

## 2019-12-30 DIAGNOSIS — M6281 Muscle weakness (generalized): Secondary | ICD-10-CM

## 2019-12-30 DIAGNOSIS — M25552 Pain in left hip: Secondary | ICD-10-CM | POA: Insufficient documentation

## 2019-12-30 DIAGNOSIS — G8929 Other chronic pain: Secondary | ICD-10-CM | POA: Diagnosis present

## 2019-12-30 NOTE — Therapy (Signed)
[facility] [address] [place], [place], [address] Phone: [phone]   Fax:  [phone]  Physical Therapy Treatment  Patient Details  Name: Karen [name] MRN: [id number] Date of Birth: May 21, 1951 Referring Provider (PT): Dr. Jennings Books   Encounter Date: 12/30/2019   PT End of Session - 12/30/19 1648    Visit Number 8    Number of Visits 16    Date for PT Re-Evaluation 01/13/20    Authorization Type --    Authorization - Visit Number 8    Authorization - Number of Visits 10    PT Start Time 0730    PT Stop Time 0820    PT Time Calculation (min) 50 min    Activity Tolerance Patient tolerated treatment well;Patient limited by pain    Behavior During Therapy [facility] for tasks assessed/performed           Past Medical History:  Diagnosis Date  . Acquired trigger finger of both middle fingers   . Arthritis   . Breast cancer, right ([place]) 2011   Right Lumpectomy and Rad tx's.  . Depression   . GERD (gastroesophageal reflux disease)   . Hyperlipidemia   . Hypertension   . Hypothyroidism   . Narcolepsy and cataplexy   . Neuropathy   . Osteoporosis, post-menopausal   . RLS (restless legs syndrome)   . Sleep apnea     Past Surgical History:  Procedure Laterality Date  . ABDOMINAL HYSTERECTOMY    . APPENDECTOMY    . BREAST SURGERY    . CARDIAC CATHETERIZATION N/A 11/25/2014   Procedure: Left Heart Cath;  Surgeon: Teodoro Spray, MD;  Location: [facility];  Service: Cardiovascular;  Laterality: N/A;  . CHOLECYSTECTOMY    . COLONOSCOPY WITH PROPOFOL N/A 08/05/2018   Procedure: COLONOSCOPY WITH PROPOFOL;  Surgeon: Lollie Sails, MD;  Location: [facility] ENDOSCOPY;  Service: Endoscopy;  Laterality: N/A;  . MASTECTOMY Right    partial/lumpectomy    There were no vitals filed for this visit.   Subjective Assessment - 12/30/19 0735    Subjective Pt reports 2/10 pain NPS in L knee. Pt presents new referal for L  knee pain from orthopedics for L knee OA and patellofemoral OA. Pt states orthopedics do not want to repair menisucus in L knee due to knee OA. Pt reports burning in feet secondary to polyneuropathy where pt has to soak feet in water to help.    Pertinent History Patient is returning to therapy for balance and difficulty walking, unsteadiness. Stated it has worsened since stopping therapy. 2 falls in the last 6 months. Fell asleep standing up for first fall, two weeks ago pt did not see that there was a step down and fell. Pt reported that she hurt both knees, L more than R, difficulty bending L knee. Pt complained of pain with turning her head to the L and her L arm goes numb when she lifts it up. 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 Pt was in therapy last July, said therapy was helpful. Pt will be getting a CT scan of abdomen next week due to enzyme (pancreatic). Pt would like L knee pain to be addressed in therapy as well. Describes her LE neuropathy symptoms as tightness, numbness pt reported its in her feet and ankles but can travel up her shin intermittently, when it hurts badly extends to  just below her knee.    How long can you sit comfortably? NA if comfortable chair    How long can you stand comfortably? not more than 5 minutes    How long can you walk comfortably? Pt reported she is able to walk around the grocery store, (use of grocery cart)    Diagnostic tests nerve conduction > 3 years ago    Patient Stated Goals improve balance    Currently in Pain? Yes    Pain Score 2     Pain Location Knee    Pain Orientation Left    Pain Descriptors / Indicators Aching;Discomfort    Pain Type Chronic pain    Pain Onset More than a month ago    Multiple Pain Sites No          There.ex:  Nustep L5 for 10 min use of UE/LE's B calf stretch in standing: 2x30 sec B calf raises: 1x20 6" step-ups B: 2x12  Neuro  Re-ed: Balance on airex pad feet together: 3x30 sec  Balance on airex pad feet together eyes closed: 1x30 sec with increased lateral sway  Resisted walking forwards/backwards/L/R: x4/direction in // bars with single black tubing resistance       Knee evaluation:   OBJECTIVE   Mental Status Patient's fund of knowledge is within normal limits for educational level.   SENSATION: Grossly intact to light touch bilateral LE as determined by testing dermatomes L2-S2 Proprioception and hot/cold testing deferred on this date  MUSCULOSKELETAL: Tremor: None Bulk: Normal Tone: Normal   Gait Patient ambulates with L antalgic gait pattern and decreased heel strike on LLE and decreased L step length.   Palpation Increased pain response on medial joint line of L knee and medial joint line of patellafemoral joint.    Strength (out of 5) R/L 4-/4 Hip flexion 4-/-4 Hip abduction (seated) 4-/-4 Hip adduction (seated) 5/4* Knee extension 5/4* Knee flexion 5/5 Ankle dorsiflexion *Indicates pain   AROM (degrees) R/L (all movements increased pain response) Knee flexion: WFL/99 degrees Knee extension: 0 deg/ -6 deg from terminal extension   Passive joint mobility. L tibiofemoral PA/AP hypomobile and concordant pain with AP.  L patellafemoral joint hypmobile inf/sup/med/lat with concordant pain with med mobilzation RLE joint mobility WNL. No hypomobility noted.      SPECIAL TESTS Single leg step down R/L:  R displayed mild knee valgus and no pain with descent L knee displayed moderate knee valgus with concordant pain upon descent with knee flexion.       [facility] PT Assessment - 12/31/19 0001      Assessment   Medical Diagnosis Neuropathy    Referring Provider (PT) Dr. Jennings Books    Onset Date/Surgical Date 10/15/18    Prior Therapy Yes      [place] residence      Prior Function   Level of Independence Independent      Cognition    Overall Cognitive Status Within Functional Limits for tasks assessed           PT Short Term Goals - 11/18/19 0828      PT SHORT TERM GOAL #1   Title Pt will be independent with HEP in order to improve strength and balance in order to decrease fall risk and improve function at home and work.    Baseline Not initiated    Time 4    Period Weeks    Status New    Target Date 12/16/19  PT Long Term Goals - 11/18/19 5366      PT LONG TERM GOAL #1   Title Patient will improve BERG by at least 3 points in order to demonstrate clinically significant improvement in balance.      Baseline 6/1 45/56    Time 8    Period Weeks    Status New    Target Date 01/13/20      PT LONG TERM GOAL #2   Title Patient will decrease 5TSTS by at least 3 seconds in order to demonstrate clinically significant improvement in LE strength and decrease risk of falls.    Baseline 6/1 27 seconds with BUE on thighs    Time 8    Period Weeks    Status New    Target Date 01/13/20      PT LONG TERM GOAL #3   Title Pt will demonstrate step over step stair navigation with unilateral UE support    Baseline unable, step to pattern    Time 8    Period Weeks    Target Date 01/13/20      PT LONG TERM GOAL #4   Title Pt will score at least 10 points higher on FOTO score indicating improved functional abilities and activity tolerance.    Baseline 6/1: 40    Time 8    Period Weeks    Status New    Target Date 01/13/20                 Plan - 12/30/19 1648    Clinical Impression Statement Pt performed bouts of dynamic balance and BLE strengthening exercises with no increase in L knee pain. Pt brought in new eval from orthopedic for therapy for L knee strength. Knee evaluation performed after treatment session. Pt reports L knee pain at medial joint line and med/lat patellafemoral joint. Pt's AROM of R knee flex/ext is [facility] and pain free with no hypomobility noted in AP/PA tibiofemoral joint and  patellafemoral joint is WFL in all directions. Pt displayed limited AROM of L knee: L knee flex measured at 99 deg and L knee extension lacking -6 deg of terminal extension. concordant pain with PA tibiofemoral joint mobs and med patellafemoral mobs. No pain sup/inf or lat however all directions hypomobile throughout. RLE strength WFL. L hip flex: 4-/5, L knee ext: 4/5 with pain, L knee flex: 4/5 with pain. L DF 5/5. L hip abd/qdd 4-/5. Pt can continue to benefit from skilled PT treatment to improve balance and improve LLE joint mobility and L knee strength to improve standing and walking ADL's.    Personal Factors and Comorbidities Comorbidity 3+;Past/Current Experience;Fitness;Behavior Pattern;Age    Comorbidities restless leg syndrome; lymphedema; hyperlipidemia; HTN; OA; breast cancer, narcolepsy, neuropathy, sleep apnea sees and MD for elevated RA markers    Examination-Activity Limitations Bed Mobility;Lift;Bend;Sleep;Sit;Transfers;Reach Overhead;Stairs    Examination-Participation Restrictions Cleaning;Laundry;Yard Work;Other;Community Activity;Driving    Stability/Clinical Decision Making Evolving/Moderate complexity    Rehab Potential Fair    PT Frequency 2x / week    PT Duration 8 weeks    PT Treatment/Interventions ADLs/Self Care Home Management;Moist Heat;Stair training;Gait training;Functional mobility training;Neuromuscular re-education;Balance training;Therapeutic exercise;Therapeutic activities;Patient/family education;Manual techniques;Taping;Dry needling;Cryotherapy    PT Next Visit Plan Reassess HEP and progress as needed.  Balance activities.    PT Home Exercise Plan gentle L knee ROM, icing    Consulted and Agree with Plan of Care Patient           Patient will benefit from skilled therapeutic  intervention in order to improve the following deficits and impairments:  Abnormal gait, Impaired sensation, Improper body mechanics, Pain, Decreased mobility, Postural dysfunction,  Decreased activity tolerance, Decreased endurance, Decreased range of motion, Decreased strength, Decreased balance, Difficulty walking, Increased edema, Obesity, Increased muscle spasms, Impaired tone, Impaired flexibility  Visit Diagnosis: Difficulty in walking, not elsewhere classified  Muscle weakness (generalized)  Acute pain of left knee  Unsteadiness on feet  Chronic left hip pain     Problem List Patient Active Problem List   Diagnosis Date Noted  . Tachycardia 03/01/2018  . Benign breast lumps 11/26/2017  . Cancer ([place]) 11/26/2017  . Depression 11/26/2017  . Hyperlipidemia 11/26/2017  . Hypertension 11/26/2017  . Osteoporosis, post-menopausal 11/26/2017  . Arthritis 03/26/2017  . Neuropathy 03/26/2017  . Leg pain, bilateral 03/26/2017  . Achilles tendinosis 03/26/2017  . RLS (restless legs syndrome) 03/26/2017  . Chronic deep vein thrombosis (DVT) of proximal vein of lower extremity ([place]) 03/26/2017  . Other insomnia 01/02/2017  . Chills without fever 12/26/2016  . Chronic pain of left heel 12/26/2016  . Increased band cell count 12/26/2016  . Vitamin D deficiency 11/14/2016  . Weight gain with edema 11/14/2016  . Other headache syndrome 11/07/2016  . Hyponatremia 10/21/2016  . Prediabetes 08/08/2016  . Irritable bowel syndrome with constipation and diarrhea 01/20/2015  . Obesity, morbid ([place]) 01/20/2015  . Adult BMI 35.0-35.9 kg/sq m 12/24/2014  . Obstructive sleep apnea syndrome 12/03/2014  . Multiple joint pain 07/22/2014  . BRCA negative 01/20/2014  . Malignant neoplasm of right female breast ([place]) 12/15/2013   Pura Spice, PT, DPT # [address] SPT 12/31/2019, 7:29 AM  [facility] [address] [place], [place], [address] Phone: [phone]   Fax:  [phone]  Name: Karen [name] MRN: [id number] Date of Birth: 1951-02-17

## 2019-12-31 ENCOUNTER — Encounter: Payer: Self-pay | Admitting: Physical Therapy

## 2020-01-06 ENCOUNTER — Other Ambulatory Visit: Payer: Self-pay

## 2020-01-06 ENCOUNTER — Encounter: Payer: Self-pay | Admitting: Physical Therapy

## 2020-01-06 ENCOUNTER — Ambulatory Visit: Payer: Medicare Other | Admitting: Physical Therapy

## 2020-01-06 DIAGNOSIS — R262 Difficulty in walking, not elsewhere classified: Secondary | ICD-10-CM

## 2020-01-06 DIAGNOSIS — M6281 Muscle weakness (generalized): Secondary | ICD-10-CM

## 2020-01-06 DIAGNOSIS — R2681 Unsteadiness on feet: Secondary | ICD-10-CM

## 2020-01-06 DIAGNOSIS — M25552 Pain in left hip: Secondary | ICD-10-CM

## 2020-01-06 DIAGNOSIS — G8929 Other chronic pain: Secondary | ICD-10-CM

## 2020-01-06 DIAGNOSIS — M25562 Pain in left knee: Secondary | ICD-10-CM

## 2020-01-06 NOTE — Patient Instructions (Signed)
Access Code: [email or web address]: [email or web address]: 07/20/2021Prepared by: Legrand Como SherkExercises  Supine Gluteal Sets - 1 x daily - 4 x weekly - 2 sets - 10 reps - 5 second hold  Supine Quad Set - 1 x daily - 4 x weekly - 2 sets - 10 reps - 5 second hold  Straight Leg Raise - 1 x daily - 4 x weekly - 2 sets - 8 reps  Sidelying Hip Abduction - 1 x daily - 4 x weekly - 2 sets - 8 reps

## 2020-01-06 NOTE — Therapy (Signed)
[facility] [address] [place], [place], [address] Phone: [phone]   Fax:  [phone]  Physical Therapy Treatment  Patient Details  Name: Karen [name] MRN: [id number] Date of Birth: [birth year] Referring Provider (PT): Dr. Ronne Binning   Encounter Date: 01/06/2020   PT End of Session - 01/06/20 0745    Visit Number 9    Number of Visits 16    Date for PT Re-Evaluation 01/13/20    Authorization - Visit Number 9    Authorization - Number of Visits 10    PT Start Time 0263    PT Stop Time 7858    PT Time Calculation (min) 42 min    Activity Tolerance Patient limited by pain;Patient tolerated treatment well    Behavior During Therapy [facility] for tasks assessed/performed           Past Medical History:  Diagnosis Date  . Acquired trigger finger of both middle fingers   . Arthritis   . Breast cancer, right ([place]) 2011   Right Lumpectomy and Rad tx's.  . Depression   . GERD (gastroesophageal reflux disease)   . Hyperlipidemia   . Hypertension   . Hypothyroidism   . Narcolepsy and cataplexy   . Neuropathy   . Osteoporosis, post-menopausal   . RLS (restless legs syndrome)   . Sleep apnea     Past Surgical History:  Procedure Laterality Date  . ABDOMINAL HYSTERECTOMY    . APPENDECTOMY    . BREAST SURGERY    . CARDIAC CATHETERIZATION N/A 11/25/2014   Procedure: Left Heart Cath;  Surgeon: Teodoro Spray, MD;  Location: [facility];  Service: Cardiovascular;  Laterality: N/A;  . CHOLECYSTECTOMY    . COLONOSCOPY WITH PROPOFOL N/A 08/05/2018   Procedure: COLONOSCOPY WITH PROPOFOL;  Surgeon: Lollie Sails, MD;  Location: [facility] ENDOSCOPY;  Service: Endoscopy;  Laterality: N/A;  . MASTECTOMY Right    partial/lumpectomy    There were no vitals filed for this visit.   Subjective Assessment - 01/06/20 0739    Subjective Pt reports frustration with no answers on why her B feet are swelling, redness and painful.  Pt reports blisters on feet. Pt states she is not sleeping well due to knee pain and foot pain. Current pain level is 7/10 NPS and L knee a 2/10 NPS.    Pertinent History Patient is returning to therapy for balance and difficulty walking, unsteadiness. Stated it has worsened since stopping therapy. 2 falls in the last 6 months. Fell asleep standing up for first fall, two weeks ago pt did not see that there was a step down and fell. Pt reported that she hurt both knees, L more than R, difficulty bending L knee. Pt complained of pain with turning her head to the L and her L arm goes numb when she lifts it up. 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 Pt was in therapy last July, said therapy was helpful. Pt will be getting a CT scan of abdomen next week due to enzyme (pancreatic). Pt would like L knee pain to be addressed in therapy as well. Describes her LE neuropathy symptoms as tightness, numbness pt reported its in her feet and ankles but can travel up her shin intermittently, when it hurts badly extends to just below her knee.    How long can you sit comfortably? NA if comfortable chair  How long can you stand comfortably? not more than 5 minutes    How long can you walk comfortably? Pt reported she is able to walk around the grocery store, (use of grocery cart)    Diagnostic tests nerve conduction > 3 years ago    Patient Stated Goals improve balance    Currently in Pain? Yes    Pain Score 7     Pain Location Foot    Pain Orientation Left;Right    Pain Descriptors / Indicators Burning    Pain Type Chronic pain;Neuropathic pain    Pain Onset More than a month ago    Pain Score 2    Pain Location Knee    Pain Orientation Left    Pain Descriptors / Indicators Aching;Discomfort    Pain Type Chronic pain    Pain Onset More than a month ago          Pain prior to treatment:   B feet: 7/10 NPS  L knee: 2/10 NPS  There.ex:    Nu-Step L5 for 10 min with use of UE/LE's Glut sets: 2x10  Quad sets L knee: 2x10  L SLR: 2x8 L side-lying hip abduction: 2x8   Manual Therapy:   L knee long arc distraction pt in supine: 2x30 sec  Grade 2 patella mobs med/lat for pain relief: 2x30 sec Desensitization techniques: Light touch over med L quad and patella with hypersensitivity to light touch  L calf stretch: 2x30 sec   Pain after treatment:   B feet: 7/10 NPS   L knee: 2/10 NPS   Pt educated on HEP for L knee and hip exercises and desensitization techniques to desensitize L quad and patellar region.   PT Short Term Goals - 11/18/19 2725      PT SHORT TERM GOAL #1   Title Pt will be independent with HEP in order to improve strength and balance in order to decrease fall risk and improve function at home and work.    Baseline Not initiated    Time 4    Period Weeks    Status New    Target Date 12/16/19             PT Long Term Goals - 11/18/19 0828      PT LONG TERM GOAL #1   Title Patient will improve BERG by at least 3 points in order to demonstrate clinically significant improvement in balance.      Baseline 6/1 45/56    Time 8    Period Weeks    Status New    Target Date 01/13/20      PT LONG TERM GOAL #2   Title Patient will decrease 5TSTS by at least 3 seconds in order to demonstrate clinically significant improvement in LE strength and decrease risk of falls.    Baseline 6/1 27 seconds with BUE on thighs    Time 8    Period Weeks    Status New    Target Date 01/13/20      PT LONG TERM GOAL #3   Title Pt will demonstrate step over step stair navigation with unilateral UE support    Baseline unable, step to pattern    Time 8    Period Weeks    Target Date 01/13/20      PT LONG TERM GOAL #4   Title Pt will score at least 10 points higher on FOTO score indicating improved functional abilities and activity tolerance.    Baseline 6/1:  40    Time 8    Period Weeks    Status New    Target  Date 01/13/20            Plan - 01/06/20 5399    Clinical Impression Statement Pt currently having flare up of B foot pain that is burning. Manual techniques for L knee performed and new HEP exercises for new L knee referral demonstrated and performed: Glut set, quad set, sidelying hip abduction, and SLR performed with verbal and tactile cues form therapist needed. No increase in L knee pain or B foot pain at end of session. Pt educated on at home desensitizaiton techniques due to hypersensitivity to light touch on L quad. Pt limited by B foot pain.    Personal Factors and Comorbidities Comorbidity 3+;Past/Current Experience;Fitness;Behavior Pattern;Age    Comorbidities restless leg syndrome; lymphedema; hyperlipidemia; HTN; OA; breast cancer, narcolepsy, neuropathy, sleep apnea sees and MD for elevated RA markers    Examination-Activity Limitations Bed Mobility;Lift;Bend;Sleep;Sit;Transfers;Reach Overhead;Stairs    Examination-Participation Restrictions Cleaning;Laundry;Yard Work;Other;Community Activity;Driving    Stability/Clinical Decision Making Evolving/Moderate complexity    Rehab Potential Fair    PT Frequency 2x / week    PT Duration 8 weeks    PT Treatment/Interventions ADLs/Self Care Home Management;Moist Heat;Stair training;Gait training;Functional mobility training;Neuromuscular re-education;Balance training;Therapeutic exercise;Therapeutic activities;Patient/family education;Manual techniques;Taping;Dry needling;Cryotherapy    PT Next Visit Plan E-stim for L quad. Reassess HEP, LE strength    PT Home Exercise Plan Glut set, quad set, SLR, side-lying hip abduction    Consulted and Agree with Plan of Care Patient           Patient will benefit from skilled therapeutic intervention in order to improve the following deficits and impairments:  Abnormal gait, Impaired sensation, Improper body mechanics, Pain, Decreased mobility, Postural dysfunction, Decreased activity tolerance,  Decreased endurance, Decreased range of motion, Decreased strength, Decreased balance, Difficulty walking, Increased edema, Obesity, Increased muscle spasms, Impaired tone, Impaired flexibility  Visit Diagnosis: Difficulty in walking, not elsewhere classified  Muscle weakness (generalized)  Acute pain of left knee  Unsteadiness on feet  Chronic left hip pain     Problem List Patient Active Problem List   Diagnosis Date Noted  . Tachycardia 03/01/2018  . Benign breast lumps 11/26/2017  . Cancer ([facility]) 11/26/2017  . Depression 11/26/2017  . Hyperlipidemia 11/26/2017  . Hypertension 11/26/2017  . Osteoporosis, post-menopausal 11/26/2017  . Arthritis 03/26/2017  . Neuropathy 03/26/2017  . Leg pain, bilateral 03/26/2017  . Achilles tendinosis 03/26/2017  . RLS (restless legs syndrome) 03/26/2017  . Chronic deep vein thrombosis (DVT) of proximal vein of lower extremity ([facility]) 03/26/2017  . Other insomnia 01/02/2017  . Chills without fever 12/26/2016  . Chronic pain of left heel 12/26/2016  . Increased band cell count 12/26/2016  . Vitamin D deficiency 11/14/2016  . Weight gain with edema 11/14/2016  . Other headache syndrome 11/07/2016  . Hyponatremia 10/21/2016  . Prediabetes 08/08/2016  . Irritable bowel syndrome with constipation and diarrhea 01/20/2015  . Obesity, morbid ([facility]) 01/20/2015  . Adult BMI 35.0-35.9 kg/sq m 12/24/2014  . Obstructive sleep apnea syndrome 12/03/2014  . Multiple joint pain 07/22/2014  . BRCA negative 01/20/2014  . Malignant neoplasm of right female breast ([facility]) 12/15/2013   Cammie Mcgee, PT, DPT # [address] SPT 01/06/2020, 12:36 PM  [facility] [address] [place], [place], [address] Phone: [phone]   Fax:  [phone]  Name: Karen [name] MRN:  [id number] Date of Birth: 1950-12-05

## 2020-01-13 ENCOUNTER — Ambulatory Visit: Payer: Medicare Other | Admitting: Physical Therapy

## 2020-01-20 ENCOUNTER — Encounter: Payer: Self-pay | Admitting: Physical Therapy

## 2020-01-20 ENCOUNTER — Ambulatory Visit: Payer: Medicare Other | Attending: Neurology | Admitting: Physical Therapy

## 2020-01-20 ENCOUNTER — Other Ambulatory Visit: Payer: Self-pay

## 2020-01-20 DIAGNOSIS — G8929 Other chronic pain: Secondary | ICD-10-CM | POA: Diagnosis present

## 2020-01-20 DIAGNOSIS — R2681 Unsteadiness on feet: Secondary | ICD-10-CM

## 2020-01-20 DIAGNOSIS — M25552 Pain in left hip: Secondary | ICD-10-CM | POA: Insufficient documentation

## 2020-01-20 DIAGNOSIS — R262 Difficulty in walking, not elsewhere classified: Secondary | ICD-10-CM | POA: Diagnosis present

## 2020-01-20 DIAGNOSIS — M6281 Muscle weakness (generalized): Secondary | ICD-10-CM

## 2020-01-20 DIAGNOSIS — M25562 Pain in left knee: Secondary | ICD-10-CM

## 2020-01-20 NOTE — Therapy (Signed)
[facility] [address] [place], [place], [address] Phone: [phone]   Fax:  [phone]  Physical Therapy Treatment  Patient Details  Name: Karen [name] MRN: [id number] Date of Birth: Jun 21, 1950 Referring Provider (PT): Dr. Ronne Binning   Encounter Date: 01/20/2020   PT End of Session - 01/20/20 1251    Visit Number 10    Number of Visits 14    Date for PT Re-Evaluation 02/17/20    Authorization - Visit Number 1    Authorization - Number of Visits 10    PT Start Time 0211    PT Stop Time 0808    PT Time Calculation (min) 31 min    Activity Tolerance Patient tolerated treatment well;No increased pain    Behavior During Therapy WFL for tasks assessed/performed           Past Medical History:  Diagnosis Date   Acquired trigger finger of both middle fingers    Arthritis    Breast cancer, right ([place]) 2011   Right Lumpectomy and Rad tx's.   Depression    GERD (gastroesophageal reflux disease)    Hyperlipidemia    Hypertension    Hypothyroidism    Narcolepsy and cataplexy    Neuropathy    Osteoporosis, post-menopausal    RLS (restless legs syndrome)    Sleep apnea     Past Surgical History:  Procedure Laterality Date   ABDOMINAL HYSTERECTOMY     APPENDECTOMY     BREAST SURGERY     CARDIAC CATHETERIZATION N/A 11/25/2014   Procedure: Left Heart Cath;  Surgeon: Teodoro Spray, MD;  Location: [facility];  Service: Cardiovascular;  Laterality: N/A;   CHOLECYSTECTOMY     COLONOSCOPY WITH PROPOFOL N/A 08/05/2018   Procedure: COLONOSCOPY WITH PROPOFOL;  Surgeon: Lollie Sails, MD;  Location: [facility];  Service: Endoscopy;  Laterality: N/A;   MASTECTOMY Right    partial/lumpectomy    There were no vitals filed for this visit.   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 Pt trying to find a doctor in [place] that treats this for a diagnosis. Pt states her L knee has improved and her main concern right now are her feet burning. Pt reports a loss of 12 lbs which she believes has helped her knee pain.    Pertinent History Patient is returning to therapy for balance and difficulty walking, unsteadiness. Stated it has worsened since stopping therapy. 2 falls in the last 6 months. Fell asleep standing up for first fall, two weeks ago pt did not see that there was a step down and fell. Pt reported that she hurt both knees, L more than R, difficulty bending L knee. Pt complained of pain with turning her head to the L and her L arm goes numb when she lifts it up. 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 Pt was in therapy last July, said therapy was helpful. Pt will be getting a CT scan of abdomen next week due to enzyme (pancreatic). Pt would like L knee pain to be addressed in therapy as well. Describes her LE neuropathy symptoms as tightness, numbness pt reported its in her feet and ankles but can travel up her shin intermittently, when it hurts badly extends to  just below her knee.    How long can you sit comfortably? NA if comfortable chair    How long can you stand comfortably? not more than 5 minutes    How long can you walk comfortably? Pt reported she is able to walk around the grocery store, (use of grocery cart)    Diagnostic tests nerve conduction > 3 years ago    Patient Stated Goals improve balance    Currently in Pain? Yes    Pain Score 1     Pain Location Knee    Pain Orientation Left    Pain Descriptors / Indicators Aching;Discomfort    Pain Type Chronic pain    Pain Onset More than a month ago    Pain Frequency Intermittent    Multiple Pain Sites Yes    Pain Score 6    Pain Location Foot    Pain Orientation Right;Left;Other (Comment)    Pain Descriptors  / Indicators Burning    Pain Type Chronic pain    Pain Onset More than a month ago           Neuro Re-Ed:   Reassessment of goals:   FOTO score: 53 with a goal of 50   BERG: 54/56. Difficulty with SLS   5xSTS: 14.06 sec with initial time of 27 sec.   Discussed pt on future POC for Knee impairments on future therapy focusing on improving knee AROM, BLE strength, and gait to improve functional mobility. Pt in agreement with POC going forward.      PT Short Term Goals - 01/20/20 1259      PT SHORT TERM GOAL #1   Title Pt will be independent with HEP in order to improve strength and balance in order to decrease fall risk and improve function at home and work.    Baseline Not initiated; 8/3: Indep with HEP    Time 4    Period Weeks    Status Achieved    Target Date 01/20/20             PT Long Term Goals - 01/20/20 1252      PT LONG TERM GOAL #1   Title Patient will improve BERG by at least 3 points in order to demonstrate clinically significant improvement in balance.      Baseline 6/1 45/56; 8/3: 54/56    Time 4    Period Weeks    Status Achieved    Target Date 01/20/20      PT LONG TERM GOAL #2   Title Patient will decrease 5TSTS by at least 3 seconds in order to demonstrate clinically significant improvement in LE strength and decrease risk of falls.    Baseline 6/1 27 seconds with BUE on thighs; 8/3: 14.06 sec with BUE's on thighs.    Time 4    Period Weeks    Status Achieved    Target Date 01/20/20      PT LONG TERM GOAL #3   Title Pt will demonstrate step over step stair navigation with unilateral UE support    Baseline unable, step to pattern; 8/3: able to asc/desc reciprocal pattern with single UE use on handrail    Time 4    Period Weeks    Status Achieved    Target Date 01/20/20      PT LONG TERM GOAL #4   Title Pt will score at least 10 points higher on FOTO score indicating improved functional abilities and activity tolerance.  Baseline 6/1: 40;  8/3: 53    Time 4    Period Weeks    Status Achieved    Target Date 01/20/20      PT LONG TERM GOAL #5   Title Pt will improve R knee ext AROM to R knee to improve normalized gait mechanics    Baseline 01/20/2020: L knee ext -6 deg    Time 4    Period Weeks    Status New    Target Date 02/17/20      Additional Long Term Goals   Additional Long Term Goals Yes      PT LONG TERM GOAL #6   Title Pt will improve 5xSTS goal < 12 sec to demonstrate significant improvements in BLE strength    Baseline 01/20/2020: 14.06 sec    Time 4    Period Weeks    Status New    Target Date 02/17/20      PT LONG TERM GOAL #7   Title Pt will be able to walk > 10 min with < 2/10 NPS in knee pain to improve ability to ambulate community distance with less pain    Baseline 01/20/2020: Has not been walking frequently due to knee pain.    Time 4    Period Weeks    Status New    Target Date 02/17/20      PT LONG TERM GOAL #8   Title Pt will be able to perform single leg step down with no knee valgus demonstrating improved motor control and lateral hip strength.    Baseline 01/20/2020: mild knee valgus with step down    Time 4    Period Weeks    Status New    Target Date 02/17/20              Plan - 01/20/20 1309    Clinical Impression Statement Pt has accomplished all of her goals for her referral for gait instability. Pt improved FOTO score to 53 with a goal of 10, demonstrating preceived improvements in functional mobility. Pt has also improved BERG balance score to 54/56 meaning pt is not at increased risk of falls. Pt making progress in BLE strength by improving 5xSTS score to 14.06 sec with an initial score of 27 sec displaying improvements in BLE strength. Pt demonstrating safe ability to asc/desc stairs with reciprocal gait and single use of UE on hand rail. Pt can continue to benefit from skilled PT for recent referral to PT for L knee impairments to improve gait, AROM, and BLE strength.     Personal Factors and Comorbidities Comorbidity 3+;Past/Current Experience;Fitness;Behavior Pattern;Age    Comorbidities restless leg syndrome; lymphedema; hyperlipidemia; HTN; OA; breast cancer, narcolepsy, neuropathy, sleep apnea sees and MD for elevated RA markers    Examination-Activity Limitations Bed Mobility;Lift;Bend;Sleep;Sit;Transfers;Reach Overhead;Stairs    Examination-Participation Restrictions Cleaning;Laundry;Yard Work;Other;Community Activity;Driving    Stability/Clinical Decision Making Evolving/Moderate complexity    Clinical Decision Making Moderate    Rehab Potential Fair    PT Frequency 1x / week    PT Duration 4 weeks    PT Treatment/Interventions ADLs/Self Care Home Management;Moist Heat;Stair training;Gait training;Functional mobility training;Neuromuscular re-education;Balance training;Therapeutic exercise;Therapeutic activities;Patient/family education;Manual techniques;Taping;Dry needling;Cryotherapy    PT Next Visit Plan E-stim for L quad. Reassess HEP, LE strength    PT Home Exercise Plan Glut set, quad set, SLR, side-lying hip abduction    Consulted and Agree with Plan of Care Patient           Patient will benefit  from skilled therapeutic intervention in order to improve the following deficits and impairments:  Abnormal gait, Impaired sensation, Improper body mechanics, Pain, Decreased mobility, Postural dysfunction, Decreased activity tolerance, Decreased endurance, Decreased range of motion, Decreased strength, Decreased balance, Difficulty walking, Increased edema, Obesity, Increased muscle spasms, Impaired tone, Impaired flexibility  Visit Diagnosis: Difficulty in walking, not elsewhere classified  Chronic left hip pain  Muscle weakness (generalized)  Acute pain of left knee  Unsteadiness on feet     Problem List Patient Active Problem List   Diagnosis Date Noted   Tachycardia 03/01/2018   Benign breast lumps 11/26/2017   Cancer ([place])  11/26/2017   Depression 11/26/2017   Hyperlipidemia 11/26/2017   Hypertension 11/26/2017   Osteoporosis, post-menopausal 11/26/2017   Arthritis 03/26/2017   Neuropathy 03/26/2017   Leg pain, bilateral 03/26/2017   Achilles tendinosis 03/26/2017   RLS (restless legs syndrome) 03/26/2017   Chronic deep vein thrombosis (DVT) of proximal vein of lower extremity ([place]) 03/26/2017   Other insomnia 01/02/2017   Chills without fever 12/26/2016   Chronic pain of left heel 12/26/2016   Increased band cell count 12/26/2016   Vitamin D deficiency 11/14/2016   Weight gain with edema 11/14/2016   Other headache syndrome 11/07/2016   Hyponatremia 10/21/2016   Prediabetes 08/08/2016   Irritable bowel syndrome with constipation and diarrhea 01/20/2015   Obesity, morbid ([place]) 01/20/2015   Adult BMI 35.0-35.9 kg/sq m 12/24/2014   Obstructive sleep apnea syndrome 12/03/2014   Multiple joint pain 07/22/2014   BRCA negative 01/20/2014   Malignant neoplasm of right female breast ([place]) 12/15/2013   Pura Spice, PT, DPT # [id number] Larna Daughters, SPT 01/20/2020, 2:29 PM  [facility] [address]. [place], [place], [address] Phone: [phone]   Fax:  [phone]  Name: Karen [name] MRN: [id number] Date of Birth: [birth year]

## 2020-01-27 ENCOUNTER — Encounter: Payer: Self-pay | Admitting: Physical Therapy

## 2020-01-27 ENCOUNTER — Other Ambulatory Visit: Payer: Self-pay

## 2020-01-27 ENCOUNTER — Ambulatory Visit: Payer: Medicare Other | Admitting: Physical Therapy

## 2020-01-27 DIAGNOSIS — R262 Difficulty in walking, not elsewhere classified: Secondary | ICD-10-CM | POA: Diagnosis not present

## 2020-01-27 DIAGNOSIS — M25562 Pain in left knee: Secondary | ICD-10-CM

## 2020-01-27 DIAGNOSIS — M25552 Pain in left hip: Secondary | ICD-10-CM

## 2020-01-27 DIAGNOSIS — M6281 Muscle weakness (generalized): Secondary | ICD-10-CM

## 2020-01-27 DIAGNOSIS — R2681 Unsteadiness on feet: Secondary | ICD-10-CM

## 2020-01-27 DIAGNOSIS — G8929 Other chronic pain: Secondary | ICD-10-CM

## 2020-01-27 NOTE — Therapy (Signed)
[facility] [address] [place], [place], [address] Phone: [phone]   Fax:  [phone]  Physical Therapy Treatment  Patient Details  Name: Karen [name] MRN: [id number] Date of Birth: July 12, 1950 Referring Provider (PT): Dr. Ronne Binning   Encounter Date: 01/27/2020   PT End of Session - 01/27/20 0737    Visit Number 11    Number of Visits 14    Date for PT Re-Evaluation 02/17/20    Authorization - Visit Number 2    Authorization - Number of Visits 10    PT Start Time 0729    PT Stop Time 0815    PT Time Calculation (min) 46 min    Activity Tolerance Patient tolerated treatment well;No increased pain    Behavior During Therapy WFL for tasks assessed/performed           Past Medical History:  Diagnosis Date   Acquired trigger finger of both middle fingers    Arthritis    Breast cancer, right ([place]) 2011   Right Lumpectomy and Rad tx's.   Depression    GERD (gastroesophageal reflux disease)    Hyperlipidemia    Hypertension    Hypothyroidism    Narcolepsy and cataplexy    Neuropathy    Osteoporosis, post-menopausal    RLS (restless legs syndrome)    Sleep apnea     Past Surgical History:  Procedure Laterality Date   ABDOMINAL HYSTERECTOMY     APPENDECTOMY     BREAST SURGERY     CARDIAC CATHETERIZATION N/A 11/25/2014   Procedure: Left Heart Cath;  Surgeon: Teodoro Spray, MD;  Location: [facility];  Service: Cardiovascular;  Laterality: N/A;   CHOLECYSTECTOMY     COLONOSCOPY WITH PROPOFOL N/A 08/05/2018   Procedure: COLONOSCOPY WITH PROPOFOL;  Surgeon: Lollie Sails, MD;  Location: [facility] ENDOSCOPY;  Service: Endoscopy;  Laterality: N/A;   MASTECTOMY Right    partial/lumpectomy    There were no vitals filed for this visit.   Subjective Assessment - 01/27/20 0733    Subjective Pt reports L knee pain at 3/10 NPS and continues to have burning in her B feet. Pt states her R  foot is beginning to change to "dark brown" and going up the lat aspect of her R foot. Pt reports in the past month and a half it has begun spreading upward. Pt has scheduled vein specialist appointment on this upcoming monday.    Pertinent History Patient is returning to therapy for balance and difficulty walking, unsteadiness. Stated it has worsened since stopping therapy. 2 falls in the last 6 months. Fell asleep standing up for first fall, two weeks ago pt did not see that there was a step down and fell. Pt reported that she hurt both knees, L more than R, difficulty bending L knee. Pt complained of pain with turning her head to the L and her L arm goes numb when she lifts it up. 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 Pt was in therapy last July, said therapy was helpful. Pt will be getting a CT scan of abdomen next week due to enzyme (pancreatic). Pt would like L knee pain to be addressed in therapy as well. Describes her LE neuropathy symptoms as tightness, numbness pt reported its in her feet and ankles but can travel up her shin intermittently, when it hurts badly extends to just below her  knee.    How long can you sit comfortably? NA if comfortable chair    How long can you stand comfortably? not more than 5 minutes    How long can you walk comfortably? Pt reported she is able to walk around the grocery store, (use of grocery cart)    Diagnostic tests nerve conduction > 3 years ago    Patient Stated Goals improve balance    Currently in Pain? Yes    Pain Score 3     Pain Location Knee    Pain Orientation Left    Pain Descriptors / Indicators Aching;Discomfort    Pain Type Chronic pain    Pain Onset More than a month ago    Multiple Pain Sites Yes    Pain Score 6    Pain Location Foot    Pain Orientation Right;Left;Upper;Medial    Pain Descriptors / Indicators Burning    Pain Type Chronic pain    Pain Onset More than  a month ago          There.ex:   Nu Step L4 for 10 min with use of UE's/LE's. Discussion of foot pain during.  Resisted walking with 1 BTB in // bars for hip strength: Fwd/Bckwd/R/L x5. Reports of increased difficulty side stepping L and R with L >R.  4 way hip strength with 4 lbs ankle weights: 2x8, with verbal and tactile cues for form Supine bridge: 1x12 B SLR: 2x15  Pt educated on Standing hip exercises to perform at home for HEP. Given handout.    PT Short Term Goals - 01/20/20 1259      PT SHORT TERM GOAL #1   Title Pt will be independent with HEP in order to improve strength and balance in order to decrease fall risk and improve function at home and work.    Baseline Not initiated; 8/3: Indep with HEP    Time 4    Period Weeks    Status Achieved    Target Date 01/20/20             PT Long Term Goals - 01/20/20 1252      PT LONG TERM GOAL #1   Title Patient will improve BERG by at least 3 points in order to demonstrate clinically significant improvement in balance.      Baseline 6/1 45/56; 8/3: 54/56    Time 4    Period Weeks    Status Achieved    Target Date 01/20/20      PT LONG TERM GOAL #2   Title Patient will decrease 5TSTS by at least 3 seconds in order to demonstrate clinically significant improvement in LE strength and decrease risk of falls.    Baseline 6/1 27 seconds with BUE on thighs; 8/3: 14.06 sec with BUE's on thighs.    Time 4    Period Weeks    Status Achieved    Target Date 01/20/20      PT LONG TERM GOAL #3   Title Pt will demonstrate step over step stair navigation with unilateral UE support    Baseline unable, step to pattern; 8/3: able to asc/desc reciprocal pattern with single UE use on handrail    Time 4    Period Weeks    Status Achieved    Target Date 01/20/20      PT LONG TERM GOAL #4   Title Pt will score at least 10 points higher on FOTO score indicating improved functional abilities and activity tolerance.  Baseline 6/1:  40; 8/3: 53    Time 4    Period Weeks    Status Achieved    Target Date 01/20/20      PT LONG TERM GOAL #5   Title Pt will improve R knee ext AROM to R knee to improve normalized gait mechanics    Baseline 01/20/2020: L knee ext -6 deg    Time 4    Period Weeks    Status New    Target Date 02/17/20      Additional Long Term Goals   Additional Long Term Goals Yes      PT LONG TERM GOAL #6   Title Pt will improve 5xSTS goal < 12 sec to demonstrate significant improvements in BLE strength    Baseline 01/20/2020: 14.06 sec    Time 4    Period Weeks    Status New    Target Date 02/17/20      PT LONG TERM GOAL #7   Title Pt will be able to walk > 10 min with < 2/10 NPS in knee pain to improve ability to ambulate community distance with less pain    Baseline 01/20/2020: Has not been walking frequently due to knee pain.    Time 4    Period Weeks    Status New    Target Date 02/17/20      PT LONG TERM GOAL #8   Title Pt will be able to perform single leg step down with no knee valgus demonstrating improved motor control and lateral hip strength.    Baseline 01/20/2020: mild knee valgus with step down    Time 4    Period Weeks    Status New    Target Date 02/17/20                 Plan - 01/27/20 0815    Clinical Impression Statement Pt progressing in CKC hip strength with no increased L knee pain. Pt able to perform 4 way standing hip exercises with B 4 lbs ankle weights with min verbal and tactile cues for form with improved carryover in form after cues. Pt demonstrates most weakness in B glut med with reports of increased fatigue with lat hip exercises. Pt can continue to benefit from skilled PT to improve BLE strength and L knee AROM to improve functional mobility.    Personal Factors and Comorbidities Comorbidity 3+;Past/Current Experience;Fitness;Behavior Pattern;Age    Comorbidities restless leg syndrome; lymphedema; hyperlipidemia; HTN; OA; breast cancer, narcolepsy,  neuropathy, sleep apnea sees and MD for elevated RA markers    Examination-Activity Limitations Bed Mobility;Lift;Bend;Sleep;Sit;Transfers;Reach Overhead;Stairs    Examination-Participation Restrictions Cleaning;Laundry;Yard Work;Other;Community Activity;Driving    Stability/Clinical Decision Making Evolving/Moderate complexity    Rehab Potential Fair    PT Frequency 1x / week    PT Duration 4 weeks    PT Treatment/Interventions ADLs/Self Care Home Management;Moist Heat;Stair training;Gait training;Functional mobility training;Neuromuscular re-education;Balance training;Therapeutic exercise;Therapeutic activities;Patient/family education;Manual techniques;Taping;Dry needling;Cryotherapy    PT Next Visit Plan E-stim for L quad. Reassess HEP, LE strength    PT Home Exercise Plan Glut set, quad set, SLR, side-lying hip abduction, glut bridge, Standing hip ext/abd with Red TB    Consulted and Agree with Plan of Care Patient           Patient will benefit from skilled therapeutic intervention in order to improve the following deficits and impairments:  Abnormal gait, Impaired sensation, Improper body mechanics, Pain, Decreased mobility, Postural dysfunction, Decreased activity tolerance, Decreased endurance, Decreased  range of motion, Decreased strength, Decreased balance, Difficulty walking, Increased edema, Obesity, Increased muscle spasms, Impaired tone, Impaired flexibility  Visit Diagnosis: Difficulty in walking, not elsewhere classified  Chronic left hip pain  Muscle weakness (generalized)  Acute pain of left knee  Unsteadiness on feet     Problem List Patient Active Problem List   Diagnosis Date Noted   Tachycardia 03/01/2018   Benign breast lumps 11/26/2017   Cancer ([place]) 11/26/2017   Depression 11/26/2017   Hyperlipidemia 11/26/2017   Hypertension 11/26/2017   Osteoporosis, post-menopausal 11/26/2017   Arthritis 03/26/2017   Neuropathy 03/26/2017   Leg pain,  bilateral 03/26/2017   Achilles tendinosis 03/26/2017   RLS (restless legs syndrome) 03/26/2017   Chronic deep vein thrombosis (DVT) of proximal vein of lower extremity ([place]) 03/26/2017   Other insomnia 01/02/2017   Chills without fever 12/26/2016   Chronic pain of left heel 12/26/2016   Increased band cell count 12/26/2016   Vitamin D deficiency 11/14/2016   Weight gain with edema 11/14/2016   Other headache syndrome 11/07/2016   Hyponatremia 10/21/2016   Prediabetes 08/08/2016   Irritable bowel syndrome with constipation and diarrhea 01/20/2015   Obesity, morbid ([place]) 01/20/2015   Adult BMI 35.0-35.9 kg/sq m 12/24/2014   Obstructive sleep apnea syndrome 12/03/2014   Multiple joint pain 07/22/2014   BRCA negative 01/20/2014   Malignant neoplasm of right female breast ([place]) 12/15/2013   Pura Spice, PT, DPT # [id number] Larna Daughters, SPT 01/27/2020, 4:21 PM  [facility] [address] [place], [place], [address] Phone: [phone]   Fax:  [phone]  Name: Karen [name] MRN: [id number] Date of Birth: Dec 03, 1950

## 2020-01-27 NOTE — Patient Instructions (Signed)
Access Code: [email or web address]: [email or web address]: 08/10/2021Prepared by: Legrand Como SherkExercises  Standing Hip Abduction with Resistance at Thighs - 1 x daily - 4 x weekly - 2 sets - 8 reps  Hip Extension with Resistance Loop - 1 x daily - 4 x weekly - 2 sets - 8 reps  Supine Bridge - 1 x daily - 4 x weekly - 2 sets - 10 reps

## 2020-02-03 ENCOUNTER — Ambulatory Visit: Payer: Medicare Other | Admitting: Physical Therapy

## 2020-02-17 ENCOUNTER — Ambulatory Visit: Payer: Medicare Other | Admitting: Physical Therapy

## 2020-02-24 ENCOUNTER — Ambulatory Visit: Payer: Medicare Other | Admitting: Physical Therapy

## 2020-07-27 ENCOUNTER — Other Ambulatory Visit: Payer: Self-pay

## 2020-07-27 ENCOUNTER — Ambulatory Visit: Payer: Medicare Other | Admitting: Podiatry

## 2020-07-27 DIAGNOSIS — L84 Corns and callosities: Secondary | ICD-10-CM | POA: Diagnosis not present

## 2020-07-27 DIAGNOSIS — G629 Polyneuropathy, unspecified: Secondary | ICD-10-CM | POA: Diagnosis not present

## 2020-07-27 NOTE — Progress Notes (Signed)
   HPI: 70 y.o. female presenting today for dry skin that is developed to the bilateral tips of the toes.  She states that she has tried Vaseline in the past.  Otherwise she has not done anything for treatment.  She presents for further treatment and evaluation  Past Medical History:  Diagnosis Date  . Acquired trigger finger of both middle fingers   . Arthritis   . Breast cancer, right ([place]) 2011   Right Lumpectomy and Rad tx's.  . Depression   . GERD (gastroesophageal reflux disease)   . Hyperlipidemia   . Hypertension   . Hypothyroidism   . Narcolepsy and cataplexy   . Neuropathy   . Osteoporosis, post-menopausal   . RLS (restless legs syndrome)   . Sleep apnea      Physical Exam: General: The patient is alert and oriented x3 in no acute distress.  Dermatology: Skin is warm, dry and supple bilateral lower extremities. Negative for open lesions or macerations.  Hyperkeratotic callus of skin especially to the tips of the toes bilateral noted.  No open wounds noted.  There is some very superficial breakdown of the skin to the right third toe  Vascular: Palpable pedal pulses bilaterally.  There is some minimal edema noted to the bilateral lower extremities. Capillary refill within normal limits.  Neurological: Epicritic and protective threshold diminished bilaterally.   Musculoskeletal Exam: History of bilateral bunionectomies with hammertoe repairs  Assessment: 1.  Small fiber neuropathy as per biopsies taken previously 2.  Positive rheumatoid factor 3.  Abnormal nerve conduction velocity studies 4.  Sensory axonal peripheral polyneuropathy 5.  Xerosis of skin/dry callus skin   Plan of Care:  1. Patient evaluated.  2.  Patient has been treated for and diagnosed with sensory axonal peripheral neuropathy.  Continue management by pain management at [facility] for the lower extremity neuropathic pain 5.  Patient has seen multiple specialists including neurology at [facility] clinic,  neuromuscular specialist at [facility], pain management at [facility], and orthopedics. 6.  Recommend that the patient hydrate her feet daily with lotion.  Recommend urea 40% lotion or AmLactin lotion daily.  7.  Recommend good shoes 8.  Return to clinic as needed     Edrick Kins, DPM [facility]  Dr. Edrick Kins, DPM    [address], [place] [address]                Office [phone]  Fax [phone]

## 2020-07-28 ENCOUNTER — Ambulatory Visit: Payer: Medicare Other | Admitting: Podiatry

## 2022-01-26 ENCOUNTER — Ambulatory Visit: Payer: Medicare Other | Admitting: Podiatry

## 2022-01-26 DIAGNOSIS — M7752 Other enthesopathy of left foot: Secondary | ICD-10-CM | POA: Diagnosis not present

## 2022-01-26 NOTE — Progress Notes (Signed)
Subjective:  Patient ID: Karen [name], female    DOB: 11/07/1950,  MRN: [id number]  Chief Complaint  Patient presents with   foot care    Routine foot care    71 y.o. female presents with the above complaint.  Patient presents with complaint of left ankle capsulitis.  Patient states is painful to touch.  Patient states that hurts with ambulation.  She would like to discuss treatment options for her.  She has not seen anyone else prior to seeing me.  She has not tried any immobilization technique.  Is been on for last few months pain scale 7 out of 10.   Review of Systems: Negative except as noted in the HPI. Denies N/V/F/Ch.  Past Medical History:  Diagnosis Date   Acquired trigger finger of both middle fingers    Arthritis    Breast cancer, right ([place]) 2011   Right Lumpectomy and Rad tx's.   Depression    GERD (gastroesophageal reflux disease)    Hyperlipidemia    Hypertension    Hypothyroidism    Narcolepsy and cataplexy    Neuropathy    Osteoporosis, post-menopausal    RLS (restless legs syndrome)    Sleep apnea     Current Outpatient Medications:    albuterol (PROVENTIL HFA;VENTOLIN HFA) 108 (90 Base) MCG/ACT inhaler, Inhale 2 puffs into the lungs every 6 (six) hours as needed for wheezing or shortness of breath., Disp: , Rfl:    aspirin 81 MG EC tablet, Take by mouth., Disp: , Rfl:    ELIQUIS 5 MG TABS tablet, Take 5 mg by mouth 2 (two) times daily., Disp: , Rfl:    furosemide (LASIX) 20 MG tablet, Take 20 mg by mouth daily., Disp: , Rfl:    L-Methylfolate-Algae-B12-B6 (METANX) 3-90.314-2-35 MG CAPS, Take 1 tablet by mouth daily., Disp: 30 capsule, Rfl: 2   levothyroxine (SYNTHROID, LEVOTHROID) 75 MCG tablet, , Disp: , Rfl:    lisinopril (PRINIVIL,ZESTRIL) 20 MG tablet, Take 20 mg by mouth daily., Disp: , Rfl: 3   meloxicam (MOBIC) 15 MG tablet, Take 15 mg by mouth daily., Disp: , Rfl: 0   Omega-3 Fatty Acids (FISH OIL) 1000 MG CAPS, Take 1,000 mg by mouth  daily., Disp: , Rfl:    pramipexole (MIRAPEX) 1 MG tablet, Take 1 mg by mouth 3 (three) times daily., Disp: , Rfl:    tiZANidine (ZANAFLEX) 2 MG tablet, Take 1 tablet (2 mg total) by mouth 2 (two) times daily as needed for muscle spasms., Disp: 60 tablet, Rfl: 4   topiramate (TOPAMAX) 25 MG tablet, Take 25 mg by mouth 2 (two) times daily., Disp: , Rfl:    traMADol (ULTRAM) 50 MG tablet, Take 1 tablet (50 mg total) by mouth every 8 (eight) hours as needed., Disp: 30 tablet, Rfl: 0   DULoxetine (CYMBALTA) 60 MG capsule, Take 1 capsule (60 mg total) by mouth daily., Disp: 30 capsule, Rfl: 4   hyoscyamine (LEVBID) 0.375 MG 12 hr tablet, Take by mouth., Disp: , Rfl:   Current Facility-Administered Medications:    betamethasone acetate-betamethasone sodium phosphate (CELESTONE) injection 3 mg, 3 mg, Intramuscular, Once, Evans, Brent M, DPM   betamethasone acetate-betamethasone sodium phosphate (CELESTONE) injection 3 mg, 3 mg, Intramuscular, Once, Edrick Kins, DPM  Social History   Tobacco Use  Smoking Status Never  Smokeless Tobacco Never    No Known Allergies Objective:  There were no vitals filed for this visit. There is no height or weight on file to calculate  BMI. Constitutional Well developed. Well nourished.  Vascular Dorsalis pedis pulses palpable bilaterally. Posterior tibial pulses palpable bilaterally. Capillary refill normal to all digits.  No cyanosis or clubbing noted. Pedal hair growth normal.  Neurologic Normal speech. Oriented to person, place, and time. Epicritic sensation to light touch grossly present bilaterally.  Dermatologic Nails well groomed and normal in appearance. No open wounds. No skin lesions.  Orthopedic: Pain on palpation to the left ankle gutter pain along the course of peroneal tendon posterior tibial tendon.  Pain with range of motion of the ankle joint.  No deep intra-articular pain noted.  No pain at the Achilles tendon.   Radiographs:  None Assessment:   1. Capsulitis of ankle, left    Plan:  Patient was evaluated and treated and all questions answered.  Left ankle capsulitis -All questions and concerns were discussed with the patient in extensive detail.  Given the amount of pain that she is having especially in the setting of other structures that are hurting due to compensation I believe patient would benefit from immobilization of the ankle joint to allow the soft tissue structure to heal appropriately.  Patient agrees with plan like to proceed with cam boot immobilization -+ Cam boot was dispensed -We will discuss steroid injection during next clinical visit for the residual pain  No follow-ups on file.

## 2022-01-26 NOTE — Progress Notes (Signed)
Foot care

## 2022-02-01 ENCOUNTER — Ambulatory Visit
Admission: RE | Admit: 2022-02-01 | Discharge: 2022-02-01 | Disposition: A | Discharge status: Home / Self Care | Payer: Self-pay | Source: Ambulatory Visit | Attending: Neurosurgery | Admitting: Neurosurgery

## 2022-02-01 ENCOUNTER — Other Ambulatory Visit: Payer: Self-pay

## 2022-02-01 DIAGNOSIS — Z049 Encounter for examination and observation for unspecified reason: Secondary | ICD-10-CM

## 2022-02-13 NOTE — Progress Notes (Deleted)
Referring Physician:  Vladimir Crofts, MD [facility] [place],   [id number]  Primary Physician:  Karen Jericho, NP  History of Present Illness: 02/13/2022 [name] is here today with a chief complaint of *** Numbness and tingling in the bilateral feet that goes into the legs and stops at the knees. She also complains of numbness and tingling in the hands?  Duration: *** Location: *** Quality: *** Severity: *** 10/10? Precipitating: aggravated by *** Modifying factors: made better by ***nothing? Weakness: none Timing: ***constant Bowel/Bladder Dysfunction: none  Conservative measures:  Physical therapy: *** has not participated in?  Multimodal medical therapy including regular antiinflammatories: *** lyrica, cymbalta, gabapentin, prednisone Injections: *** has not had epidural steroid injections  Past Surgery: ***denies  Karen [name] has ***no symptoms of cervical myelopathy.  The symptoms are causing a significant impact on the patient's life.   Review of Systems:  A 10 point review of systems is negative, except for the pertinent positives and negatives detailed in the HPI.  Past Medical History: Past Medical History:  Diagnosis Date   Acquired trigger finger of both middle fingers    Arthritis    Breast cancer, right ([place]) 2011   Right Lumpectomy and Rad tx's.   Depression    GERD (gastroesophageal reflux disease)    Hyperlipidemia    Hypertension    Hypothyroidism    Narcolepsy and cataplexy    Neuropathy    Osteoporosis, post-menopausal    RLS (restless legs syndrome)    Sleep apnea     Past Surgical History: Past Surgical History:  Procedure Laterality Date   ABDOMINAL HYSTERECTOMY     APPENDECTOMY     BREAST SURGERY     CARDIAC CATHETERIZATION N/A 11/25/2014   Procedure: Left Heart Cath;  Surgeon: Karen Spray, MD;  Location: [place] [facility];  Service: Cardiovascular;   Laterality: N/A;   CHOLECYSTECTOMY     COLONOSCOPY WITH PROPOFOL N/A 08/05/2018   Procedure: COLONOSCOPY WITH PROPOFOL;  Surgeon: Karen Sails, MD;  Location: [facility] ENDOSCOPY;  Service: Endoscopy;  Laterality: N/A;   MASTECTOMY Right    partial/lumpectomy    Allergies: Allergies as of 02/14/2022   (No Known Allergies)    Medications: No outpatient medications have been marked as taking for the 02/14/22 encounter (Appointment) with Karen Maw, MD.   Current Facility-Administered Medications for the 02/14/22 encounter (Appointment) with Karen Maw, MD  Medication   betamethasone acetate-betamethasone sodium phosphate (CELESTONE) injection 3 mg   betamethasone acetate-betamethasone sodium phosphate (CELESTONE) injection 3 mg    Social History: Social History   Tobacco Use   Smoking status: Never   Smokeless tobacco: Never  Vaping Use   Vaping Use: Never used  Substance Use Topics   Alcohol use: Yes    Alcohol/week: 5.0 standard drinks of alcohol    Types: 5 Shots of liquor per week   Drug use: No    Family Medical History: Family History  Problem Relation Age of Onset   Cancer Mother    Hypertension Mother    Hypertension Father    Heart disease Father    Emphysema Father    Depression Father    Cancer Sister    Hypertension Sister     Physical Examination: There were no vitals filed for this visit.  General: Patient is well developed, well nourished, calm, collected, and in no apparent distress. Attention to examination is appropriate.  Neck:   Supple.  ***  Full range of motion.  Respiratory: Patient is breathing without any difficulty.   NEUROLOGICAL:     Awake, alert, oriented to person, place, and time.  Speech is clear and fluent. Fund of knowledge is appropriate.   Cranial Nerves: Pupils equal round and reactive to light.  Facial tone is symmetric.  Facial sensation is symmetric. Shoulder shrug is symmetric. Tongue protrusion is  midline.  There is no pronator drift.  ROM of spine: full.    Strength: Side Biceps Triceps Deltoid Interossei Grip Wrist Ext. Wrist Flex.  R '5 5 5 5 5 5 5  '$ L '5 5 5 5 5 5 5   '$ Side Iliopsoas Quads Hamstring PF DF EHL  R '5 5 5 5 5 5  '$ L '5 5 5 5 5 5   '$ Reflexes are ***2+ and symmetric at the biceps, triceps, brachioradialis, patella and achilles.   Hoffman's is absent.  Clonus is not present.  Toes are down-going.  Bilateral upper and lower extremity sensation is intact to light touch.    No evidence of dysmetria noted.  Gait is normal.   ***No difficulty with tandem gait.    Medical Decision Making  Imaging: ***  I have personally reviewed the images and agree with the above interpretation.  Assessment and Plan: Ms. Olkowski is a pleasant 71 y.o. female with ***   I spent a total of *** minutes in face-to-face and non-face-to-face activities related to this patient's care today.  Thank you for involving me in the care of this patient.      Chester K. Izora Ribas MD, [facility] Neurosurgery

## 2022-02-14 ENCOUNTER — Ambulatory Visit: Payer: Medicare Other | Admitting: Neurosurgery

## 2022-02-22 NOTE — Progress Notes (Signed)
Referring Physician:  Vladimir Crofts, MD [facility] [place],  [place] [address]  Primary Physician:  Ricardo Jericho, NP  History of Present Illness: 02/22/2022 [name] is here today with a chief complaint of numbness in her fingers and hands.  She is also having some balance issues.  She reports urge incontinence, but that has improved with overactive bladder medication.  She reports low back pain and discomfort into her legs particularly when she walks or stands in 1 position.  When she sits down, her symptoms improved.  She does have to use a cart [place] the market.  Her symptoms have been worsening over the past year but have been present for several years.  She reports shocklike and stabbing pain as bad as 9 out of 10 made worse by walking, standing, and prolonged sitting.  Bending forward and laying down make it better.   Bowel/Bladder Dysfunction: none  Conservative measures:  Physical therapy:  has not participated Multimodal medical therapy including regular antiinflammatories: lyrica, meloxicam, prednisone, tramadol, ibuprofen Injections: has not received epidural steroid injections?  Past Surgery: denies  Karen [name] has no symptoms of cervical myelopathy.  The symptoms are causing a significant impact on the patient's life.   Review of Systems:  A 10 point review of systems is negative, except for the pertinent positives and negatives detailed in the HPI.  Past Medical History: Past Medical History:  Diagnosis Date   Acquired trigger finger of both middle fingers    Arthritis    Breast cancer, right ([place]) 2011   Right Lumpectomy and Rad tx's.   Depression    GERD (gastroesophageal reflux disease)    Hyperlipidemia    Hypertension    Hypothyroidism    Narcolepsy and cataplexy    Neuropathy    Osteoporosis, post-menopausal    RLS (restless legs syndrome)    Sleep apnea      Past Surgical History: Past Surgical History:  Procedure Laterality Date   ABDOMINAL HYSTERECTOMY     APPENDECTOMY     BREAST SURGERY     CARDIAC CATHETERIZATION N/A 11/25/2014   Procedure: Left Heart Cath;  Surgeon: Teodoro Spray, MD;  Location: [place] [facility];  Service: Cardiovascular;  Laterality: N/A;   CHOLECYSTECTOMY     COLONOSCOPY WITH PROPOFOL N/A 08/05/2018   Procedure: COLONOSCOPY WITH PROPOFOL;  Surgeon: Lollie Sails, MD;  Location: [facility] ENDOSCOPY;  Service: Endoscopy;  Laterality: N/A;   MASTECTOMY Right    partial/lumpectomy    Allergies: Allergies as of 02/23/2022   (No Known Allergies)    Medications: No outpatient medications have been marked as taking for the 02/23/22 encounter (Appointment) with Meade Maw, MD.   Current Facility-Administered Medications for the 02/23/22 encounter (Appointment) with Meade Maw, MD  Medication   betamethasone acetate-betamethasone sodium phosphate (CELESTONE) injection 3 mg   betamethasone acetate-betamethasone sodium phosphate (CELESTONE) injection 3 mg    Social History: Social History   Tobacco Use   Smoking status: Never   Smokeless tobacco: Never  Vaping Use   Vaping Use: Never used  Substance Use Topics   Alcohol use: Yes    Alcohol/week: 5.0 standard drinks of alcohol    Types: 5 Shots of liquor per week   Drug use: No    Family Medical History: Family History  Problem Relation Age of Onset   Cancer Mother    Hypertension Mother    Hypertension Father  Heart disease Father    Emphysema Father    Depression Father    Cancer Sister    Hypertension Sister     Physical Examination: There were no vitals filed for this visit.  General: Patient is well developed, well nourished, calm, collected, and in no apparent distress. Attention to examination is appropriate.  Neck:   Supple.  Full range of motion.  Respiratory: Patient is breathing without any  difficulty.   NEUROLOGICAL:     Awake, alert, oriented to person, place, and time.  Speech is clear and fluent. Fund of knowledge is appropriate.   Cranial Nerves: Pupils equal round and reactive to light.  Facial tone is symmetric.  Facial sensation is symmetric. Shoulder shrug is symmetric. Tongue protrusion is midline.  There is no pronator drift.  ROM of spine: full.    Strength: Side Biceps Triceps Deltoid Interossei Grip Wrist Ext. Wrist Flex.  R '5 5 5 5 5 5 5  '$ L '5 5 5 5 5 5 5   '$ Side Iliopsoas Quads Hamstring PF DF EHL  R '5 5 5 5 5 5  '$ L '5 5 5 5 5 5   '$ Reflexes are 1+ and symmetric at the biceps, triceps, brachioradialis, patella and achilles.   Hoffman's is absent.  Clonus is not present.  Toes are down-going.  Bilateral upper and lower extremity sensation is intact to light touch.    No evidence of dysmetria noted.    Medical Decision Making  Imaging: MRI C spine 01/26/22 IMPRESSION:  Multilevel cervical spondylosis as described above with multiple levels of  moderate to severe neural foraminal stenosis from C3 to C7, and moderate  spinal canal stenosis from C5-C6 and C6-C7.   Electronically Signed by:  Waunita Schooner, MD, [facility]  Electronically Signed on:  01/26/2022 5:30 PM  MRI L spine 01/20/22 IMPRESSION:  1.  Severe L4-L5 spinal canal stenosis with compression of the thecal sac  and cauda equina. Findings do not appear significantly changed compared to  CT 11/24/2019. Correlate clinically for signs/symptoms of cauda equina  syndrome.  2.  Moderate L2-L3 spinal canal stenosis. Additional degenerative changes  as above.    Electronically Signed by:  Georga Bora, MD, [facility]  Electronically Signed on:  01/20/2022 9:18 PM  I have personally reviewed the images and agree with the above interpretation.  Assessment and Plan: [name] is a pleasant 71 y.o. 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  I recommended trying physical therapy and an injection.  If she does not improve from this, we will consider L4-5 decompression with possible discectomy.  I will see her back in 6 to 8 weeks after she does physical therapy.  She will   I spent a total of 30 minutes in face-to-face and non-face-to-face activities related to this patient's care today.  Thank you for involving me in the care of this patient.      Lashannon Bresnan K. Izora Ribas MD, [facility] Neurosurgery

## 2022-02-23 ENCOUNTER — Encounter: Payer: Self-pay | Admitting: Neurosurgery

## 2022-02-23 ENCOUNTER — Ambulatory Visit (INDEPENDENT_AMBULATORY_CARE_PROVIDER_SITE_OTHER): Payer: Medicare Other | Admitting: Neurosurgery

## 2022-02-23 VITALS — BP 144/92 | HR 86 | Ht 68.0 in | Wt 253.0 lb

## 2022-02-23 DIAGNOSIS — M48062 Spinal stenosis, lumbar region with neurogenic claudication: Secondary | ICD-10-CM | POA: Diagnosis not present

## 2022-02-28 ENCOUNTER — Ambulatory Visit (INDEPENDENT_AMBULATORY_CARE_PROVIDER_SITE_OTHER): Payer: Medicare Other

## 2022-02-28 ENCOUNTER — Ambulatory Visit: Payer: Medicare Other | Admitting: Podiatry

## 2022-02-28 DIAGNOSIS — M7672 Peroneal tendinitis, left leg: Secondary | ICD-10-CM

## 2022-02-28 MED ORDER — BETAMETHASONE SOD PHOS & ACET 6 (3-3) MG/ML IJ SUSP: 3.0000 mg | Freq: Once | INTRAMUSCULAR | Status: DC

## 2022-02-28 MED ORDER — METHYLPREDNISOLONE 4 MG PO TBPK: ORAL_TABLET | ORAL | 0 refills | Status: DC

## 2022-02-28 MED ORDER — MELOXICAM 15 MG PO TABS: 15.0000 mg | ORAL_TABLET | Freq: Every day | ORAL | 1 refills | Status: DC

## 2022-02-28 NOTE — Progress Notes (Signed)
Dg  

## 2022-02-28 NOTE — Progress Notes (Signed)
   Chief Complaint  Patient presents with   left ankle pain    Patient is here complaining of left ankle pain.    HPI: 71 y.o. female presenting today for follow-up evaluation of pain and tenderness associated to the lateral aspect of the left ankle.  Idiopathic onset.  This has now been ongoing for about 3 weeks.  She was seen by another physician here in the office who placed her in the immobilization cam boot.  She wore it for 3 weeks but did not notice significant improvement.  She presents for further treatment and evaluation  Past Medical History:  Diagnosis Date   Acquired trigger finger of both middle fingers    Arthritis    Breast cancer, right ([place]) 2011   Right Lumpectomy and Rad tx's.   Depression    GERD (gastroesophageal reflux disease)    Hyperlipidemia    Hypertension    Hypothyroidism    Narcolepsy and cataplexy    Neuropathy    Osteoporosis, post-menopausal    RLS (restless legs syndrome)    Sleep apnea     Past Surgical History:  Procedure Laterality Date   ABDOMINAL HYSTERECTOMY     APPENDECTOMY     BREAST SURGERY     CARDIAC CATHETERIZATION N/A 11/25/2014   Procedure: Left Heart Cath;  Surgeon: Teodoro Spray, MD;  Location: [place] [facility];  Service: Cardiovascular;  Laterality: N/A;   CHOLECYSTECTOMY     COLONOSCOPY WITH PROPOFOL N/A 08/05/2018   Procedure: COLONOSCOPY WITH PROPOFOL;  Surgeon: Lollie Sails, MD;  Location: [facility] ENDOSCOPY;  Service: Endoscopy;  Laterality: N/A;   MASTECTOMY Right    partial/lumpectomy    No Known Allergies   Physical Exam: General: The patient is alert and oriented x3 in no acute distress.  Dermatology: Skin is warm, dry and supple bilateral lower extremities. Negative for open lesions or macerations.  Vascular: Palpable pedal pulses bilaterally. Capillary refill within normal limits.  Negative for any significant edema or erythema  Neurological: Light touch and protective threshold grossly  intact  Musculoskeletal Exam: No pedal deformities noted.  There is pain on palpation just posterior to the lateral malleolus along the peroneal tendon consistent with a peroneal tendinitis  Radiographic Exam:  Normal osseous mineralization. Joint spaces preserved. No fracture/dislocation/boney destruction.    Assessment: 1.  Peroneal tendinitis left   Plan of Care:  1. Patient evaluated. X-Rays reviewed.  2.  Injection of 0.5 cc Celestone Soluspan injected along the posterior aspect of the ankle just posterior to the lateral malleolus along the peroneal tendon 3.  Prescription for Medrol Dosepak 4.  Prescription for meloxicam 15 mg daily 5.  Discontinue cam walker.  Patient states that the cam walker is causing back and hip pain 6.  Ankle brace dispensed.  Wear daily with good supportive shoes and sneakers 7.  Return to clinic in 4 weeks      Edrick Kins, DPM [facility]  Dr. Edrick Kins, DPM    [address], [place] [address]                Office [phone]  Fax [phone]

## 2022-03-21 ENCOUNTER — Ambulatory Visit: Payer: Medicare Other | Admitting: Podiatry

## 2022-03-21 DIAGNOSIS — M7672 Peroneal tendinitis, left leg: Secondary | ICD-10-CM | POA: Diagnosis not present

## 2022-03-21 NOTE — Progress Notes (Signed)
   Chief Complaint  Patient presents with   Foot Pain    Patient is here for left foot follow-up, patient states that the foot feels so much better.   Follow-up    Patient is here for left foot follow-up, patient states that the foot feels so much better.    HPI: 71 y.o. female presenting today for follow-up evaluation of pain and tenderness associated to the lateral aspect of the left ankle.  Idiopathic onset.  Patient states that over the past 3 weeks the pain has resolved.  Significant improvement.  She no longer has any pain or tenderness when walking.  No new complaints at this time  Past Medical History:  Diagnosis Date   Acquired trigger finger of both middle fingers    Arthritis    Breast cancer, right ([place]) 2011   Right Lumpectomy and Rad tx's.   Depression    GERD (gastroesophageal reflux disease)    Hyperlipidemia    Hypertension    Hypothyroidism    Narcolepsy and cataplexy    Neuropathy    Osteoporosis, post-menopausal    RLS (restless legs syndrome)    Sleep apnea     Past Surgical History:  Procedure Laterality Date   ABDOMINAL HYSTERECTOMY     APPENDECTOMY     BREAST SURGERY     CARDIAC CATHETERIZATION N/A 11/25/2014   Procedure: Left Heart Cath;  Surgeon: Teodoro Spray, MD;  Location: [place] CV LAB;  Service: Cardiovascular;  Laterality: N/A;   CHOLECYSTECTOMY     COLONOSCOPY WITH PROPOFOL N/A 08/05/2018   Procedure: COLONOSCOPY WITH PROPOFOL;  Surgeon: Lollie Sails, MD;  Location: [facility] ENDOSCOPY;  Service: Endoscopy;  Laterality: N/A;   MASTECTOMY Right    partial/lumpectomy    No Known Allergies   Physical Exam: General: The patient is alert and oriented x3 in no acute distress.  Dermatology: Skin is warm, dry and supple bilateral lower extremities. Negative for open lesions or macerations.  Vascular: Palpable pedal pulses bilaterally. Capillary refill within normal limits.  Negative for any significant edema or erythema  Neurological:  Light touch and protective threshold grossly intact  Musculoskeletal Exam: No pedal deformities noted.  No pain on palpation just posterior to the lateral malleolus along the peroneal tendon consistent with a peroneal tendinitis  Assessment: 1.  Peroneal tendinitis left; resolved  Plan of Care:  1. Patient evaluated.  2.  Continue ankle brace as needed 3.  Continue meloxicam 15 mg as needed 4.  Patient may resume full activity no restrictions.  Advised against going barefoot.  Recommend good supportive shoes and sneakers 5.  Return to clinic as needed     Edrick Kins, DPM [facility]  Dr. Edrick Kins, DPM    [address], [place] [address]                Office [phone]  Fax [phone]

## 2022-04-13 ENCOUNTER — Ambulatory Visit: Payer: Medicare Other | Admitting: Neurosurgery

## 2022-04-14 ENCOUNTER — Ambulatory Visit: Payer: Medicare Other | Admitting: Podiatry

## 2022-04-24 ENCOUNTER — Encounter: Payer: Self-pay | Admitting: Podiatry

## 2022-04-24 ENCOUNTER — Ambulatory Visit: Payer: Medicare Other | Admitting: Podiatry

## 2022-04-24 DIAGNOSIS — M79674 Pain in right toe(s): Secondary | ICD-10-CM

## 2022-04-24 DIAGNOSIS — B351 Tinea unguium: Secondary | ICD-10-CM | POA: Diagnosis not present

## 2022-04-24 DIAGNOSIS — M79675 Pain in left toe(s): Secondary | ICD-10-CM

## 2022-04-24 NOTE — Progress Notes (Signed)
This patient returns to my office for at risk foot care.  This patient requires this care by a professional since this patient will be at risk due to having neuropathy.    This patient is unable to cut nails herself since the patient cannot reach her nails.These nails are painful walking and wearing shoes.  This patient presents for at risk foot care today.  General Appearance  Alert, conversant and in no acute stress.  Vascular  Dorsalis pedis and posterior tibial  pulses are palpable  bilaterally.  Capillary return is within normal limits  bilaterally. Temperature is within normal limits  bilaterally.  Neurologic  Senn-Weinstein monofilament wire test within normal limits  bilaterally. Muscle power within normal limits bilaterally.  Nails Thick disfigured discolored nails with subungual debris  from hallux to fifth toes bilaterally. No evidence of bacterial infection or drainage bilaterally.  Orthopedic  No limitations of motion  feet .  No crepitus or effusions noted.  No bony pathology or digital deformities noted.  HAV  B/L.  Skin  normotropic skin with no porokeratosis noted bilaterally.  No signs of infections or ulcers noted.     Onychomycosis  Pain in right toes  Pain in left toes  Consent was obtained for treatment procedures.   Mechanical debridement of nails 1-5  bilaterally performed with a nail nipper.  Filed with dremel without incident.    Return office visit     3 months                 Told patient to return for periodic foot care and evaluation due to potential at risk complications.   Gardiner Barefoot DPM

## 2022-04-25 ENCOUNTER — Encounter: Payer: Self-pay | Admitting: Neurosurgery

## 2022-05-02 ENCOUNTER — Ambulatory Visit: Payer: Medicare Other | Admitting: Podiatry

## 2022-05-30 ENCOUNTER — Ambulatory Visit: Payer: Medicare Other | Admitting: Podiatry

## 2022-06-22 ENCOUNTER — Encounter: Payer: Self-pay | Admitting: Neurosurgery

## 2022-06-22 ENCOUNTER — Ambulatory Visit (INDEPENDENT_AMBULATORY_CARE_PROVIDER_SITE_OTHER): Payer: Medicare Other | Admitting: Neurosurgery

## 2022-06-22 VITALS — BP 122/80 | Ht 68.0 in | Wt 251.2 lb

## 2022-06-22 DIAGNOSIS — M48062 Spinal stenosis, lumbar region with neurogenic claudication: Secondary | ICD-10-CM

## 2022-06-22 NOTE — Progress Notes (Signed)
Referring Physician:  Ricardo Jericho, NP No address on file  Primary Physician:  [facility]  History of Present Illness: 06/22/2022 Karen [name] returns to see me.  She continues to have symptoms of neurogenic claudication.  She was recently in [place] visiting family.  She had an injection which helped her somewhat.  02/23/2022 Karen [name] is here today with a chief complaint of numbness in her fingers and hands.  She is also having some balance issues.  She reports urge incontinence, but that has improved with overactive bladder medication.  She reports low back pain and discomfort into her legs particularly when she walks or stands in 1 position.  When she sits down, her symptoms improved.  She does have to use a cart [place] the market.  Her symptoms have been worsening over the past year but have been present for several years.  She reports shocklike and stabbing pain as bad as 9 out of 10 made worse by walking, standing, and prolonged sitting.  Bending forward and laying down make it better.   Bowel/Bladder Dysfunction: none  Conservative measures:  Physical therapy:  has not participated Multimodal medical therapy including regular antiinflammatories: lyrica, meloxicam, prednisone, tramadol, ibuprofen Injections: has not received epidural steroid injections?  Past Surgery: denies  Karen [name] has no symptoms of cervical myelopathy.  The symptoms are causing a significant impact on the patient's life.   Review of Systems:  A 10 point review of systems is negative, except for the pertinent positives and negatives detailed in the HPI.  Past Medical History: Past Medical History:  Diagnosis Date   Acquired trigger finger of both middle fingers    Arthritis    Breast cancer, right ([place]) 2011   Right Lumpectomy and Rad tx's.   Depression    GERD (gastroesophageal reflux disease)    Hyperlipidemia    Hypertension     Hypothyroidism    Narcolepsy and cataplexy    Neuropathy    Osteoporosis, post-menopausal    RLS (restless legs syndrome)    Sleep apnea     Past Surgical History: Past Surgical History:  Procedure Laterality Date   ABDOMINAL HYSTERECTOMY     APPENDECTOMY     BREAST SURGERY     CARDIAC CATHETERIZATION N/A 11/25/2014   Procedure: Left Heart Cath;  Surgeon: Teodoro Spray, MD;  Location: [place] [facility];  Service: Cardiovascular;  Laterality: N/A;   CHOLECYSTECTOMY     COLONOSCOPY WITH PROPOFOL N/A 08/05/2018   Procedure: COLONOSCOPY WITH PROPOFOL;  Surgeon: Lollie Sails, MD;  Location: [facility] END[facility]COPY;  Service: Endoscopy;  Laterality: N/A;   MASTECTOMY Right    partial/lumpectomy    Allergies: Allergies as of 06/22/2022   (No Known Allergies)    Medications: No outpatient medications have been marked as taking for the 06/22/22 encounter (Appointment) with Meade Maw, MD.   Current Facility-Administered Medications for the 06/22/22 encounter (Appointment) with Meade Maw, MD  Medication   betamethasone acetate-betamethasone sodium phosphate (CELESTONE) injection 3 mg   betamethasone acetate-betamethasone sodium phosphate (CELESTONE) injection 3 mg   betamethasone acetate-betamethasone sodium phosphate (CELESTONE) injection 3 mg    Social History: Social History   Tobacco Use   Smoking status: Never   Smokeless tobacco: Never  Vaping Use   Vaping Use: Never used  Substance Use Topics   Alcohol use: Yes    Alcohol/week: 5.0 standard drinks of alcohol    Types: 5 Shots of liquor  per week   Drug use: No    Family Medical History: Family History  Problem Relation Age of Onset   Cancer Mother    Hypertension Mother    Hypertension Father    Heart disease Father    Emphysema Father    Depression Father    Cancer Sister    Hypertension Sister     Physical Examination: There were no vitals filed for this visit.  General: Patient is well  developed, well nourished, calm, collected, and in no apparent distress. Attention to examination is appropriate.  Neck:   Supple.  Full range of motion.  Respiratory: Patient is breathing without any difficulty.   NEUROLOGICAL:     Awake, alert, oriented to person, place, and time.  Speech is clear and fluent. Fund of knowledge is appropriate.   Cranial Nerves: Pupils equal round and reactive to light.  Facial tone is symmetric.  Facial sensation is symmetric. Shoulder shrug is symmetric. Tongue protrusion is midline.  There is no pronator drift.  ROM of spine: full.    Strength: Side Biceps Triceps Deltoid Interossei Grip Wrist Ext. Wrist Flex.  R '5 5 5 5 5 5 5  '$ L '5 5 5 5 5 5 5   '$ Side Iliopsoas Quads Hamstring PF DF EHL  R '5 5 5 5 5 5  '$ L '5 5 5 5 5 5   '$ Reflexes are 1+ and symmetric at the biceps, triceps, brachioradialis, patella and achilles.   Hoffman's is absent.  Clonus is not present.  Toes are down-going.  Bilateral upper and lower extremity sensation is intact to light touch.    No evidence of dysmetria noted.    Medical Decision Making  Imaging: MRI C spine 01/26/22 IMPRESSION:  Multilevel cervical spondylosis as described above with multiple levels of  moderate to severe neural foraminal stenosis from C3 to C7, and moderate  spinal canal stenosis from C5-C6 and C6-C7.   Electronically Signed by:  Waunita Schooner, MD, [facility]  Electronically Signed on:  01/26/2022 5:30 PM  MRI L spine 01/20/22 IMPRESSION:  1.  Severe L4-L5 spinal canal stenosis with compression of the thecal sac  and cauda equina. Findings do not appear significantly changed compared to  CT 11/24/2019. Correlate clinically for signs/symptoms of cauda equina  syndrome.  2.  Moderate L2-L3 spinal canal stenosis. Additional degenerative changes  as above.    Electronically Signed by:  Georga Bora, MD, [facility]  Electronically Signed on:  01/20/2022 9:18 PM  I have personally  reviewed the images and agree with the above interpretation.  Assessment and Plan: Karen [name] is a pleasant 72 y.o. 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  She would like to try additional physical therapy and another injection before making a decision regarding surgical intervention.  I think this is reasonable.  I will reach out to Dr. Santa Lighter.  She is scheduled to see her physical therapist next week.  I will see her back in approximately 2 months to reassess. If she does not improve from this, we will consider L4-5 decompression with possible discectomy.  I spent a total of 10 minutes in face-to-face and non-face-to-face activities related to this patient's care today.  Thank you for involving me in the care of this patient.      Elleah Hemsley K. Izora Ribas MD, [facility]

## 2022-06-23 ENCOUNTER — Ambulatory Visit: Payer: Medicare Other | Admitting: Podiatry

## 2022-06-23 DIAGNOSIS — L97512 Non-pressure chronic ulcer of other part of right foot with fat layer exposed: Secondary | ICD-10-CM

## 2022-06-23 NOTE — Progress Notes (Signed)
   Chief Complaint  Patient presents with   Follow-up    Follow up on right foot    HPI: 72 y.o. female presenting today for follow-up evaluation of pain and tenderness associated to the lateral aspect of the left ankle.  Idiopathic onset.  Patient states that over the past 3 weeks the pain has resolved.  Significant improvement.  She no longer has any pain or tenderness when walking.  No new complaints at this time  Past Medical History:  Diagnosis Date   Acquired trigger finger of both middle fingers    Arthritis    Breast cancer, right ([place]) 2011   Right Lumpectomy and Rad tx's.   Depression    GERD (gastroesophageal reflux disease)    Hyperlipidemia    Hypertension    Hypothyroidism    Narcolepsy and cataplexy    Neuropathy    Osteoporosis, post-menopausal    RLS (restless legs syndrome)    Sleep apnea     Past Surgical History:  Procedure Laterality Date   ABDOMINAL HYSTERECTOMY     APPENDECTOMY     BREAST SURGERY     CARDIAC CATHETERIZATION N/A 11/25/2014   Procedure: Left Heart Cath;  Surgeon: Teodoro Spray, MD;  Location: [place] [facility];  Service: Cardiovascular;  Laterality: N/A;   CHOLECYSTECTOMY     COLONOSCOPY WITH PROPOFOL N/A 08/05/2018   Procedure: COLONOSCOPY WITH PROPOFOL;  Surgeon: Lollie Sails, MD;  Location: [facility] ENDOSCOPY;  Service: Endoscopy;  Laterality: N/A;   MASTECTOMY Right    partial/lumpectomy    Allergies  Allergen Reactions   Atorvastatin Other (See Comments)     Physical Exam: General: The patient is alert and oriented x3 in no acute distress.  Dermatology: Skin is warm, dry and supple bilateral lower extremities. Negative for open lesions or macerations.  Vascular: Palpable pedal pulses bilaterally. Capillary refill within normal limits.  Negative for any significant edema or erythema  Neurological: Light touch and protective threshold grossly intact  Musculoskeletal Exam: No pedal deformities noted.  No pain on  palpation just posterior to the lateral malleolus along the peroneal tendon consistent with a peroneal tendinitis  Assessment: 1.  Peroneal tendinitis left; resolved  Plan of Care:  1. Patient evaluated.  2.  Continue ankle brace as needed 3.  Continue meloxicam 15 mg as needed 4.  Patient may resume full activity no restrictions.  Advised against going barefoot.  Recommend good supportive shoes and sneakers 5.  Return to clinic as needed     Edrick Kins, DPM [facility]  Dr. Edrick Kins, DPM    [address], [place] [address]                Office [phone]  Fax [phone]

## 2022-07-17 ENCOUNTER — Ambulatory Visit: Payer: Medicare Other | Admitting: Podiatry

## 2022-07-25 ENCOUNTER — Encounter: Payer: Self-pay | Admitting: Podiatry

## 2022-07-25 ENCOUNTER — Ambulatory Visit: Payer: Medicare Other | Admitting: Podiatry

## 2022-07-25 VITALS — BP 132/81 | HR 98

## 2022-07-25 DIAGNOSIS — L989 Disorder of the skin and subcutaneous tissue, unspecified: Secondary | ICD-10-CM | POA: Diagnosis not present

## 2022-07-25 DIAGNOSIS — M79674 Pain in right toe(s): Secondary | ICD-10-CM

## 2022-07-25 DIAGNOSIS — M79675 Pain in left toe(s): Secondary | ICD-10-CM

## 2022-07-25 DIAGNOSIS — B351 Tinea unguium: Secondary | ICD-10-CM | POA: Diagnosis not present

## 2022-07-25 NOTE — Progress Notes (Signed)
   Chief Complaint  Patient presents with   Callouses    "It's doing well."    SUBJECTIVE Patient presents to office today complaining of elongated, thickened nails that cause pain while ambulating in shoes.  Patient is unable to trim their own nails.  Patient states that the ulcer to the distal tip of the right third toe has healed.  It is now just a mildly symptomatic callus.  Patient is here for further evaluation and treatment.  Past Medical History:  Diagnosis Date   Acquired trigger finger of both middle fingers    Arthritis    Breast cancer, right ([place]) 2011   Right Lumpectomy and Rad tx's.   Depression    GERD (gastroesophageal reflux disease)    Hyperlipidemia    Hypertension    Hypothyroidism    Narcolepsy and cataplexy    Neuropathy    Osteoporosis, post-menopausal    RLS (restless legs syndrome)    Sleep apnea     Allergies  Allergen Reactions   Atorvastatin Other (See Comments)     OBJECTIVE General Patient is awake, alert, and oriented x 3 and in no acute distress. Derm Skin is dry and supple bilateral. Negative open lesions or macerations. Remaining integument unremarkable. Nails are tender, long, thickened and dystrophic with subungual debris, consistent with onychomycosis, 1-5 bilateral. No signs of infection noted.  Hyperkeratotic preulcerative callus noted to the distal tips of the bilateral toes.  No open wounds noted Vasc  DP and PT pedal pulses palpable bilaterally. Temperature gradient within normal limits.  Neuro Epicritic and protective threshold sensation grossly intact bilaterally.  Musculoskeletal Exam No symptomatic pedal deformities noted bilateral. Muscular strength within normal limits.  ASSESSMENT 1.  Pain due to onychomycosis of toenails both 2.  Preulcerative callus lesions distal tips of the bilateral toes  PLAN OF CARE 1. Patient evaluated today.  2. Instructed to maintain good pedal hygiene and foot care.  3. Mechanical debridement  of nails 1-5 bilaterally performed using a nail nipper. Filed with dremel without incident.  4.  Excisional debridement of the hyperkeratotic preulcerative callus tissue was performed using a tissue nipper without incident or bleeding as a courtesy to the patient 5.  Return to clinic in 3 mos.    Edrick Kins, DPM [facility]  Dr. Edrick Kins, DPM    [address], [place] [address]                Office [phone]  Fax [phone]

## 2022-07-27 ENCOUNTER — Ambulatory Visit: Payer: Medicare Other | Admitting: Podiatry

## 2022-08-07 ENCOUNTER — Encounter: Payer: Self-pay | Admitting: Neurosurgery

## 2022-08-07 DIAGNOSIS — M48062 Spinal stenosis, lumbar region with neurogenic claudication: Secondary | ICD-10-CM

## 2022-08-10 ENCOUNTER — Ambulatory Visit: Payer: Medicare Other | Admitting: Neurosurgery

## 2022-08-10 ENCOUNTER — Encounter: Payer: Self-pay | Admitting: Neurosurgery

## 2022-08-10 VITALS — BP 161/93 | HR 80 | Temp 97.7°F | Wt 248.0 lb

## 2022-08-10 DIAGNOSIS — M48062 Spinal stenosis, lumbar region with neurogenic claudication: Secondary | ICD-10-CM

## 2022-08-10 NOTE — Progress Notes (Signed)
Referring Physician:  No referring provider defined for this encounter.  Primary Physician:  [facility]  History of Present Illness: 08/10/2022 [name] returns to see me.  She continues to have symptoms down her right leg.  She has tried conservative management which did not help.  06/22/2022 [name] returns to see me.  She continues to have symptoms of neurogenic claudication.  She was recently in [place] visiting family.  She had an injection which helped her somewhat.  02/23/2022 [name] is here today with a chief complaint of numbness in her fingers and hands.  She is also having some balance issues.  She reports urge incontinence, but that has improved with overactive bladder medication.  She reports low back pain and discomfort into her legs particularly when she walks or stands in 1 position.  When she sits down, her symptoms improved.  She does have to use a [place] market.  Her symptoms have been worsening over the past year but have been present for several years.  She reports shocklike and stabbing pain as bad as 9 out of 10 made worse by walking, standing, and prolonged sitting.  Bending forward and laying down make it better.   Bowel/Bladder Dysfunction: none  Conservative measures:  Physical therapy:  has not participated Multimodal medical therapy including regular antiinflammatories: lyrica, meloxicam, prednisone, tramadol, ibuprofen Injections: has not received epidural steroid injections?  Past Surgery: denies  Karen [name] has no symptoms of cervical myelopathy.  The symptoms are causing a significant impact on the patient's life.   Review of Systems:  A 10 point review of systems is negative, except for the pertinent positives and negatives detailed in the HPI.  Past Medical History: Past Medical History:  Diagnosis Date   Acquired trigger finger of both middle fingers    Arthritis    Breast  cancer, right ([place]) 2011   Right Lumpectomy and Rad tx's.   Depression    GERD (gastroesophageal reflux disease)    Hyperlipidemia    Hypertension    Hypothyroidism    Narcolepsy and cataplexy    Neuropathy    Osteoporosis, post-menopausal    RLS (restless legs syndrome)    Sleep apnea     Past Surgical History: Past Surgical History:  Procedure Laterality Date   ABDOMINAL HYSTERECTOMY     APPENDECTOMY     BREAST SURGERY     CARDIAC CATHETERIZATION N/A 11/25/2014   Procedure: Left Heart Cath;  Surgeon: Teodoro Spray, MD;  Location: [place] [facility];  Service: Cardiovascular;  Laterality: N/A;   CHOLECYSTECTOMY     COLONOSCOPY WITH PROPOFOL N/A 08/05/2018   Procedure: COLONOSCOPY WITH PROPOFOL;  Surgeon: Lollie Sails, MD;  Location: [facility] ENDOSCOPY;  Service: Endoscopy;  Laterality: N/A;   MASTECTOMY Right    partial/lumpectomy    Allergies: Allergies as of 08/10/2022   (No Active Allergies)    Medications: Current Meds  Medication Sig   levothyroxine (SYNTHROID, LEVOTHROID) 75 MCG tablet    lisinopril (PRINIVIL,ZESTRIL) 20 MG tablet Take 20 mg by mouth daily.   metFORMIN (GLUCOPHAGE-XR) 500 MG 24 hr tablet Take 500 mg by mouth daily.   mirabegron ER (MYRBETRIQ) 25 MG TB24 tablet Take 1 tablet by mouth daily.   pregabalin (LYRICA) 75 MG capsule Take 75 mg by mouth 3 (three) times daily.   Vitamin D, Ergocalciferol, (DRISDOL) 1.25 MG (50000 UNIT) CAPS capsule Take 50,000 Units by mouth once a  week.   Current Facility-Administered Medications for the 08/10/22 encounter (Office Visit) with Meade Maw, MD  Medication   betamethasone acetate-betamethasone sodium phosphate (CELESTONE) injection 3 mg   betamethasone acetate-betamethasone sodium phosphate (CELESTONE) injection 3 mg   betamethasone acetate-betamethasone sodium phosphate (CELESTONE) injection 3 mg    Social History: Social History   Tobacco Use   Smoking status: Never   Smokeless tobacco:  Never  Vaping Use   Vaping Use: Never used  Substance Use Topics   Alcohol use: Not Currently    Alcohol/week: 5.0 standard drinks of alcohol    Types: 5 Shots of liquor per week   Drug use: No    Family Medical History: Family History  Problem Relation Age of Onset   Cancer Mother    Hypertension Mother    Hypertension Father    Heart disease Father    Emphysema Father    Depression Father    Cancer Sister    Hypertension Sister     Physical Examination: Vitals:   08/10/22 0926  BP: (!) 161/93  Pulse: 80  Temp: 97.7 F (36.5 C)    General: Patient is well developed, well nourished, calm, collected, and in no apparent distress. Attention to examination is appropriate.  Neck:   Supple.  Full range of motion.  Respiratory: Patient is breathing without any difficulty.   NEUROLOGICAL:     Awake, alert, oriented to person, place, and time.  Speech is clear and fluent. Fund of knowledge is appropriate.   Cranial Nerves: Pupils equal round and reactive to light.  Facial tone is symmetric.  Facial sensation is symmetric. Shoulder shrug is symmetric. Tongue protrusion is midline.  There is no pronator drift.  ROM of spine: full.    Strength: Side Biceps Triceps Deltoid Interossei Grip Wrist Ext. Wrist Flex.  R 5 5 5 5 5 5 5  $ L 5 5 5 5 5 5 5   $ Side Iliopsoas Quads Hamstring PF DF EHL  R 5 5 5 5 5 5  $ L 5 5 5 5 5 5   $ Reflexes are 1+ and symmetric at the biceps, triceps, brachioradialis, patella and achilles.   Hoffman's is absent.  Clonus is not present.  Toes are down-going.  Bilateral upper and lower extremity sensation is intact to light touch.    No evidence of dysmetria noted.    Medical Decision Making  Imaging: MRI C spine 01/26/22 IMPRESSION:  Multilevel cervical spondylosis as described above with multiple levels of  moderate to severe neural foraminal stenosis from C3 to C7, and moderate  spinal canal stenosis from C5-C6 and C6-C7.   Electronically  Signed by:  Waunita Schooner, MD, [facility]  Electronically Signed on:  01/26/2022 5:30 PM  MRI L spine 01/20/22 IMPRESSION:  1.  Severe L4-L5 spinal canal stenosis with compression of the thecal sac  and cauda equina. Findings do not appear significantly changed compared to  CT 11/24/2019. Correlate clinically for signs/symptoms of cauda equina  syndrome.  2.  Moderate L2-L3 spinal canal stenosis. Additional degenerative changes  as above.    Electronically Signed by:  Georga Bora, MD, [facility]  Electronically Signed on:  01/20/2022 9:18 PM  I have personally reviewed the images and agree with the above interpretation.  Assessment and Plan: Ms. Rains is a pleasant 72 y.o. 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  At this point, her symptoms are persistent after multiple months of conservative management.  I recommended against further conservative management.  I recommended right L4-5 decompression.  I discussed the planned procedure at length with the patient, including the risks, benefits, alternatives, and indications. The risks discussed include but are not limited to bleeding, infection, need for reoperation, spinal fluid leak, stroke, vision loss, anesthetic complication, coma, paralysis, and even death. I also described in detail that improvement was not guaranteed.  The patient expressed understanding of these risks, and asked that we proceed with surgery. I described the surgery in layman's terms, and gave ample opportunity for questions, which were answered to the best of my ability.  I spent a total of 20 minutes in face-to-face and non-face-to-face activities related to this patient's care today.  Thank you for involving me in the care of this patient.      Karen [name] K. Izora Ribas MD, [facility]

## 2022-08-10 NOTE — Patient Instructions (Signed)
Please see below for information in regards to your upcoming surgery:  **we are planning to move our office location mid-March. Please check with [place] to see if we have moved to our new location prior to coming to your post op appointments after surgery. Our phone number will remain the same [phone]). Our new address will be: [facility] [address] [place], [place] [address]  Planned surgery: Right L4-5 laminoforaminotomy    Surgery date: to be determined (see options below) - you will find out your arrival time the business day before your surgery.  08/28/22 08/30/22 09/04/22 09/06/22 *other Mondays or Wednesdays after that may be available   Pre-op appointment at [facility]: we will call you with a date/time for this. Pre-admit testing is located on the first floor of the [facility], [place], [address]. Please bring all prescriptions in the original prescription bottles to your appointment, even if you have reviewed medications by phone with a pharmacy representative. Pre-op labs may be done at your pre-op appointment. You are not required to fast for these labs. Should you need to change your pre-op appointment, please call Pre-admit testing at [phone].    Surgical clearance: we will send a clearance request to Dr Reece Levy    Metformin - should be held for 2 days prior to surgery    If you have FMLA/disability paperwork, please drop it off or fax it to [phone], attention [place].   We can be reached by phone or mychart 8am-4pm, Monday-Friday. If you have any questions/concerns before or after surgery, you can reach [place] at [phone], or you can send a mychart message. If you have a concern after hours that cannot wait until normal business hours, you can call [phone] and ask to page the neurosurgeon on call for [place].     Appointments/FMLA & disability paperwork: [place]  Nurse: Ophelia Shoulder  Medical assistants: Lum Keas Physician Assistant's: [place] Surgeon: Meade Maw, MD

## 2022-08-10 NOTE — Progress Notes (Signed)
3

## 2022-08-22 ENCOUNTER — Ambulatory Visit: Payer: Medicare Other | Admitting: Neurosurgery

## 2022-08-31 MED ORDER — METHOCARBAMOL 500 MG PO TABS: 500.0000 mg | ORAL_TABLET | Freq: Three times a day (TID) | ORAL | 0 refills | Status: DC | PRN

## 2022-08-31 NOTE — Addendum Note (Signed)
Addended byGeronimo Boot on: 08/31/2022 10:21 AM   Modules accepted: Orders

## 2022-09-22 ENCOUNTER — Other Ambulatory Visit: Payer: Self-pay

## 2022-09-22 DIAGNOSIS — Z01818 Encounter for other preprocedural examination: Secondary | ICD-10-CM

## 2022-10-02 ENCOUNTER — Other Ambulatory Visit: Payer: Self-pay | Admitting: Orthopedic Surgery

## 2022-10-02 DIAGNOSIS — M48062 Spinal stenosis, lumbar region with neurogenic claudication: Secondary | ICD-10-CM

## 2022-10-06 ENCOUNTER — Encounter
Admission: RE | Admit: 2022-10-06 | Discharge: 2022-10-06 | Disposition: A | Discharge status: Home / Self Care | Payer: Medicare Other | Source: Ambulatory Visit | Attending: Neurosurgery | Admitting: Neurosurgery

## 2022-10-06 VITALS — BP 122/72 | HR 72 | Resp 12 | Ht 68.0 in | Wt 251.3 lb

## 2022-10-06 DIAGNOSIS — Z79899 Other long term (current) drug therapy: Secondary | ICD-10-CM | POA: Insufficient documentation

## 2022-10-06 DIAGNOSIS — I10 Essential (primary) hypertension: Secondary | ICD-10-CM | POA: Insufficient documentation

## 2022-10-06 DIAGNOSIS — Z01812 Encounter for preprocedural laboratory examination: Secondary | ICD-10-CM | POA: Insufficient documentation

## 2022-10-06 DIAGNOSIS — E119 Type 2 diabetes mellitus without complications: Secondary | ICD-10-CM | POA: Diagnosis not present

## 2022-10-06 DIAGNOSIS — I2510 Atherosclerotic heart disease of native coronary artery without angina pectoris: Secondary | ICD-10-CM

## 2022-10-06 DIAGNOSIS — Z01818 Encounter for other preprocedural examination: Secondary | ICD-10-CM

## 2022-10-06 HISTORY — DX: Localized edema: R60.0

## 2022-10-06 HISTORY — DX: Palpitations: R00.2

## 2022-10-06 HISTORY — DX: Prediabetes: R73.03

## 2022-10-06 HISTORY — DX: Obesity, unspecified: E66.9

## 2022-10-06 HISTORY — DX: Cardiac murmur, unspecified: R01.1

## 2022-10-06 HISTORY — DX: Acute embolism and thrombosis of unspecified deep veins of unspecified lower extremity: I82.409

## 2022-10-06 LAB — URINALYSIS, ROUTINE W REFLEX MICROSCOPIC
Bilirubin Urine: NEGATIVE
Glucose, UA: NEGATIVE mg/dL
Hgb urine dipstick: NEGATIVE
Ketones, ur: NEGATIVE mg/dL
Nitrite: NEGATIVE
Protein, ur: NEGATIVE mg/dL
Specific Gravity, Urine: 1.004 — ABNORMAL LOW (ref 1.005–1.030)
pH: 6 (ref 5.0–8.0)

## 2022-10-06 LAB — BASIC METABOLIC PANEL
Anion gap: 7 (ref 5–15)
BUN: 11 mg/dL (ref 8–23)
CO2: 29 mmol/L (ref 22–32)
Calcium: 9.1 mg/dL (ref 8.9–10.3)
Chloride: 100 mmol/L (ref 98–111)
Creatinine, Ser: 0.57 mg/dL (ref 0.44–1.00)
GFR, Estimated: >60 mL/min (ref >60)
Glucose, Bld: 93 mg/dL (ref 70–99)
Potassium: 3.6 mmol/L (ref 3.5–5.1)
Sodium: 136 mmol/L (ref 135–145)

## 2022-10-06 LAB — HEMOGLOBIN A1C
Hgb A1c MFr Bld: 5.5 % (ref 4.8–5.6)
Mean Plasma Glucose: 111.15 mg/dL

## 2022-10-06 LAB — SURGICAL PCR SCREEN
MRSA, PCR: NEGATIVE
Staphylococcus aureus: NEGATIVE

## 2022-10-06 NOTE — Patient Instructions (Addendum)
Your procedure is scheduled on:10-23-22 Monday Report to the Registration Desk on the 1st floor of the Medical Mall.Then proceed to the 2nd floor Surgery Desk To find out your arrival time, please call [phone] between 1PM - 3PM on:10-20-22 Friday If your arrival time is 6:00 am, do not arrive before that time as the Medical Mall entrance doors do not open until 6:00 am.  REMEMBER: Instructions that are not followed completely may result in serious medical risk, up to and including death; or upon the discretion of your surgeon and anesthesiologist your surgery may need to be rescheduled.  Do not eat food after midnight the night before surgery.  No gum chewing or hard candies.  You may however, drink Water up to 2 hours before you are scheduled to arrive for your surgery. Do not drink anything within 2 hours of your scheduled arrival time.  One week prior to surgery:Last dose on 4-28 Stop Anti-inflammatories (NSAIDS) such as Advil, Aleve, Ibuprofen, Motrin, Naproxen, Naprosyn and Aspirin based products such as Excedrin, Goody's Powder, BC Powder.You may however, take Tylenol if needed for pain up until the day of surgery.  Stop ANY OVER THE COUNTER supplements/vitamins 7 days prior to surgery  Stop your metFORMIN (GLUCOPHAGE-XR) 2 days prior to surgery-Last dose will be on 10-20-22 Friday  TAKE ONLY THESE MEDICATIONS THE MORNING OF SURGERY WITH A SIP OF WATER: -DULoxetine (CYMBALTA)  -levothyroxine (SYNTHROID, LEVOTHROID)  -mirabegron ER (MYRBETRIQ)  -pregabalin (LYRICA)   No Alcohol for 24 hours before or after surgery.  No Smoking including e-cigarettes for 24 hours before surgery.  No chewable tobacco products for at least 6 hours before surgery.  No nicotine patches on the day of surgery.  Do not use any "recreational" drugs for at least a week (preferably 2 weeks) before your surgery.  Please be advised that the combination of cocaine and anesthesia may have negative outcomes,  up to and including death. If you test positive for cocaine, your surgery will be cancelled.  On the morning of surgery brush your teeth with toothpaste and water, you may rinse your mouth with mouthwash if you wish. Do not swallow any toothpaste or mouthwash.  Use CHG Soap as directed on instruction sheet.  Do not wear jewelry, make-up, hairpins, clips or nail polish.  Do not wear lotions, powders, or perfumes.   Do not shave body hair from the neck down 48 hours before surgery.  Contact lenses, hearing aids and dentures may not be worn into surgery.  Do not bring valuables to the hospital. [facility] is not responsible for any missing/lost belongings or valuables.   Notify your doctor if there is any change in your medical condition (cold, fever, infection).  Wear comfortable clothing (specific to your surgery type) to the hospital.  After surgery, you can help prevent lung complications by doing breathing exercises.  Take deep breaths and cough every 1-2 hours. Your doctor may order a device called an Incentive Spirometer to help you take deep breaths. When coughing or sneezing, hold a pillow firmly against your incision with both hands. This is called "splinting." Doing this helps protect your incision. It also decreases belly discomfort.  If you are being admitted to the hospital overnight, leave your suitcase in the car. After surgery it may be brought to your room.  In case of increased patient census, it may be necessary for you, the patient, to continue your postoperative care in the Same Day Surgery department.  If you are being  discharged the day of surgery, you will not be allowed to drive home. You will need a responsible individual to drive you home and stay with you for 24 hours after surgery.   If you are taking public transportation, you will need to have a responsible individual with you.  Please call the Pre-admissions Testing Dept. at [phone] if you have  any questions about these instructions.  Surgery Visitation Policy:  Patients having surgery or a procedure may have two visitors.  Children under the age of 28 must have an adult with them who is not the patient.  Inpatient Visitation:    Visiting hours are 7 a.m. to 8 p.m. Up to four visitors are allowed at one time in a patient room. The visitors may rotate out with other people during the day.  One visitor age 71 or older may stay with the patient overnight and must be in the room by 8 p.m.

## 2022-10-07 DIAGNOSIS — B9689 Other specified bacterial agents as the cause of diseases classified elsewhere: Secondary | ICD-10-CM

## 2022-10-07 DIAGNOSIS — B3731 Acute candidiasis of vulva and vagina: Secondary | ICD-10-CM

## 2022-10-07 DIAGNOSIS — R300 Dysuria: Secondary | ICD-10-CM

## 2022-10-07 DIAGNOSIS — Z01812 Encounter for preprocedural laboratory examination: Secondary | ICD-10-CM

## 2022-10-07 LAB — URINE CULTURE: Culture: >=100000 — AB

## 2022-10-08 LAB — URINE CULTURE

## 2022-10-08 MED ORDER — SULFAMETHOXAZOLE-TRIMETHOPRIM 800-160 MG PO TABS
1.0000 | ORAL_TABLET | Freq: Two times a day (BID) | ORAL | 0 refills | Status: AC
Start: 2022-10-08 — End: 2022-10-13

## 2022-10-08 MED ORDER — FLUCONAZOLE 150 MG PO TABS
ORAL_TABLET | ORAL | 0 refills | Status: DC
Start: 2022-10-08 — End: 2023-04-19

## 2022-10-08 MED ORDER — PHENAZOPYRIDINE HCL 200 MG PO TABS
200.0000 mg | ORAL_TABLET | Freq: Three times a day (TID) | ORAL | 0 refills | Status: DC | PRN
Start: 2022-10-08 — End: 2022-11-07

## 2022-10-08 NOTE — Progress Notes (Signed)
[facility] Perioperative Services: Pre-Admission/Anesthesia Testing  Abnormal Lab Notification and Treatment Plan of Care   Date: 10/08/22  Name: Karen [name] MRN:   [id number]  Re: Abnormal labs noted during PAT appointment   Notified:  Provider Name Provider Role Notification Mode  Venetia Night, MD Neurosurgery Routed and/or faxed via [facility]   Abnormal Lab Value(s):   Lab Results  Component Value Date   COLORURINE YELLOW (A) 10/06/2022   APPEARANCEUR CLEAR (A) 10/06/2022   LABSPEC 1.004 (L) 10/06/2022   PHURINE 6.0 10/06/2022   GLUCOSEU NEGATIVE 10/06/2022   HGBUR NEGATIVE 10/06/2022   BILIRUBINUR NEGATIVE 10/06/2022   KETONESUR NEGATIVE 10/06/2022   PROTEINUR NEGATIVE 10/06/2022   NITRITE NEGATIVE 10/06/2022   LEUKOCYTESUR MODERATE (A) 10/06/2022   EPIU 0-5 10/06/2022   WBCU 21-50 10/06/2022   RBCU 0-5 10/06/2022   BACTERIA MANY (A) 10/06/2022   CULT >=100,000 COLONIES/mL KLEBSIELLA OXYTOCA (A) 10/06/2022    Clinical Information and Notes:  Patient is scheduled for a RIGHT L4-L5 LAMINOFORAMINOTOMY on 10/23/2022.    UA performed in PAT consistent with/concerning for infection.  No leukocytosis noted on CBC; WBC 8.2 Renal function: Estimated Creatinine Clearance: 86.7 mL/min (by C-G formula based on SCr of 0.57 mg/dL). Urine C&S added to assess for pathogenically significant growth.  Impression and Plan:  Karen [name] with a UA that was (+) for infection; reflex culture sent. Culture grew out pathogenically significant Klebsiella oxytoca colony count.Contacted patient to discuss. Patient reporting that she is experiencing frequency, urgency, dysuria, and lower back pain. She denies any significant abdominal/suprapubic pain, nausea, vomiting, fevers, or chills. Patient with surgery scheduled soon. In efforts to avoid delaying patient's procedure, or have her experience any potentially significant perioperative complications  related to the aforementioned, I would like to proceed with  treatment for urinary tract infection.  Patient is going out of town today, with plans to return on 10/21/2022. Prescriptions have been sent to [facility] in [facility] her request.   Allergies reviewed. Culture report also reviewed to ensure culture appropriate coverage is being provided. Will treat with a 5 day course of SMZ-TMP DS. Patient encouraged to complete the entire course of antibiotics even if she begins to feel better.  Patient encouraged to increase her fluid intake as much as possible. Discussed that water is always best to flush the urinary tract. She was advised to avoid caffeine containing fluids until her infections clears, as caffeine can cause her to experience painful bladder spasms.   May use Tylenol as needed for pain/fever should she experience these symptoms. With her going out of town, and given that she is already experiencing significant dysuria, I will send a prescription for phenazopyridine  (200 mg TID PRN for up to 3 days) to help relieve her current urinary pain.   Patient has a history of vulvovaginal candidiasis in the past while on oral antimicrobial therapy. Will send in prophylactic fluconazole dose (150 mg x 1 - may repeat in 72 hours if still symptomatic) for patient to use should she develop symptoms.   Meds ordered this encounter  Medications   sulfamethoxazole-trimethoprim (BACTRIM DS) 800-160 MG tablet    Sig: Take 1 tablet by mouth 2 (two) times daily for 5 days.    Dispense:  10 tablet    Refill:  0   phenazopyridine (PYRIDIUM) 200 MG tablet    Sig: Take 1 tablet (200 mg total) by mouth 3 (three) times daily as needed for pain.    Dispense:  9 tablet    Refill:  0   fluconazole (DIFLUCAN) 150 MG tablet    Sig: Take 1 tablet (150 mg) PO x 1 dose. May repeat 150 mg dose in 3 days if still symptomatic.    Dispense:  2 tablet    Refill:  0   Patient instructed to call surgeon's office or  PAT with any questions or concerns related to the above outlined course of treatment. Additionally, she was instructed to call if she feels like she is getting worse overall while on treatment. Results and treatment plan of care forwarded to primary attending surgeon to make them aware.   Encounter Diagnoses  Name Primary?   Pre-operative laboratory examination Yes   UTI due to Klebsiella species    Vulvovaginal candidiasis    Dysuria    Quentin Mulling, MSN, APRN, FNP-C, CEN [facility]  Peri-operative Services Nurse Practitioner Phone: [phone] Fax: [phone] 10/08/22 8:43 AM  NOTE: This note has been prepared using Dragon dictation software. Despite my best ability to proofread, there is always the potential that unintentional transcriptional errors may still occur from this process.

## 2022-10-10 ENCOUNTER — Other Ambulatory Visit: Payer: Medicare Other

## 2022-10-23 ENCOUNTER — Encounter: Payer: Self-pay | Admitting: Neurosurgery

## 2022-10-23 ENCOUNTER — Other Ambulatory Visit: Payer: Self-pay

## 2022-10-23 ENCOUNTER — Encounter
Admission: RE | Disposition: A | Discharge status: Home / Self Care | Payer: Self-pay | Source: Home / Self Care | Attending: Neurosurgery

## 2022-10-23 ENCOUNTER — Ambulatory Visit: Payer: Medicare Other | Admitting: Urgent Care

## 2022-10-23 ENCOUNTER — Ambulatory Visit
Admission: RE | Admit: 2022-10-23 | Discharge: 2022-10-23 | Disposition: A | Discharge status: Home / Self Care | Payer: Medicare Other | Attending: Neurosurgery | Admitting: Neurosurgery

## 2022-10-23 ENCOUNTER — Ambulatory Visit: Payer: Medicare Other

## 2022-10-23 DIAGNOSIS — G6181 Chronic inflammatory demyelinating polyneuritis: Secondary | ICD-10-CM | POA: Insufficient documentation

## 2022-10-23 DIAGNOSIS — R8290 Unspecified abnormal findings in urine: Secondary | ICD-10-CM

## 2022-10-23 DIAGNOSIS — Z01812 Encounter for preprocedural laboratory examination: Secondary | ICD-10-CM

## 2022-10-23 DIAGNOSIS — R350 Frequency of micturition: Secondary | ICD-10-CM

## 2022-10-23 DIAGNOSIS — M48062 Spinal stenosis, lumbar region with neurogenic claudication: Secondary | ICD-10-CM | POA: Diagnosis not present

## 2022-10-23 DIAGNOSIS — Z01818 Encounter for other preprocedural examination: Secondary | ICD-10-CM

## 2022-10-23 HISTORY — PX: FORAMINOTOMY 1 LEVEL: SHX5835

## 2022-10-23 LAB — GLUCOSE, CAPILLARY
Glucose-Capillary: 88 mg/dL (ref 70–99)
Glucose-Capillary: 100 mg/dL — ABNORMAL HIGH (ref 70–99)

## 2022-10-23 LAB — TYPE AND SCREEN
ABO/RH(D): A POS
Antibody Screen: NEGATIVE

## 2022-10-23 SURGERY — FORAMINOTOMY 1 LEVEL
Anesthesia: General | Site: Spine Lumbar | Laterality: Right

## 2022-10-23 MED ORDER — SUCCINYLCHOLINE CHLORIDE 200 MG/10ML IV SOSY
PREFILLED_SYRINGE | INTRAVENOUS | Status: DC | PRN
  Administered 2022-10-23: 140 mg via INTRAVENOUS

## 2022-10-23 MED ORDER — ACETAMINOPHEN 10 MG/ML IV SOLN
INTRAVENOUS | Status: DC | PRN
  Administered 2022-10-23: 1000 mg via INTRAVENOUS

## 2022-10-23 MED ORDER — FENTANYL CITRATE (PF) 100 MCG/2ML IJ SOLN
INTRAMUSCULAR | Status: DC | PRN
  Administered 2022-10-23: 100 ug via INTRAVENOUS

## 2022-10-23 MED ORDER — METHYLPREDNISOLONE ACETATE 40 MG/ML IJ SUSP
INTRAMUSCULAR | Status: DC | PRN
  Administered 2022-10-23: 40 mg

## 2022-10-23 MED ORDER — REMIFENTANIL HCL 1 MG IV SOLR
INTRAVENOUS | Status: DC | PRN
  Administered 2022-10-23: .1 ug/kg/min via INTRAVENOUS

## 2022-10-23 MED ORDER — MIDAZOLAM HCL 2 MG/2ML IJ SOLN
INTRAMUSCULAR | Status: DC | PRN
  Administered 2022-10-23: 2 mg via INTRAVENOUS

## 2022-10-23 MED ORDER — PHENYLEPHRINE 80 MCG/ML (10ML) SYRINGE FOR IV PUSH (FOR BLOOD PRESSURE SUPPORT)
PREFILLED_SYRINGE | INTRAVENOUS | Status: DC | PRN
  Administered 2022-10-23: 80 ug via INTRAVENOUS

## 2022-10-23 MED ORDER — DEXMEDETOMIDINE HCL IN NACL 80 MCG/20ML IV SOLN
INTRAVENOUS | Status: DC | PRN
  Administered 2022-10-23 (×2): 4 ug via INTRAVENOUS

## 2022-10-23 MED ORDER — SURGIFLO WITH THROMBIN (HEMOSTATIC MATRIX KIT) OPTIME
TOPICAL | Status: DC | PRN
  Administered 2022-10-23: 1 via TOPICAL

## 2022-10-23 MED ORDER — CEFAZOLIN SODIUM-DEXTROSE 2-4 GM/100ML-% IV SOLN
INTRAVENOUS | Status: AC
  Filled 2022-10-23: qty 100

## 2022-10-23 MED ORDER — 0.9 % SODIUM CHLORIDE (POUR BTL) OPTIME
TOPICAL | Status: DC | PRN
  Administered 2022-10-23: 500 mL

## 2022-10-23 MED ORDER — SODIUM CHLORIDE 0.9 % IV SOLN: INTRAVENOUS | Status: DC

## 2022-10-23 MED ORDER — ONDANSETRON HCL 4 MG/2ML IJ SOLN
INTRAMUSCULAR | Status: DC | PRN
  Administered 2022-10-23: 4 mg via INTRAVENOUS

## 2022-10-23 MED ORDER — MIDAZOLAM HCL 2 MG/2ML IJ SOLN
INTRAMUSCULAR | Status: AC
  Filled 2022-10-23: qty 2

## 2022-10-23 MED ORDER — PROMETHAZINE HCL 25 MG/ML IJ SOLN: 6.2500 mg | INTRAMUSCULAR | Status: DC | PRN

## 2022-10-23 MED ORDER — LIDOCAINE HCL (PF) 2 % IJ SOLN
INTRAMUSCULAR | Status: AC
  Filled 2022-10-23: qty 5

## 2022-10-23 MED ORDER — ONDANSETRON HCL 4 MG/2ML IJ SOLN
INTRAMUSCULAR | Status: AC
  Filled 2022-10-23: qty 2

## 2022-10-23 MED ORDER — OXYCODONE HCL 5 MG/5ML PO SOLN: 5.0000 mg | Freq: Once | ORAL | Status: AC | PRN

## 2022-10-23 MED ORDER — FENTANYL CITRATE (PF) 100 MCG/2ML IJ SOLN
INTRAMUSCULAR | Status: AC
  Filled 2022-10-23: qty 2

## 2022-10-23 MED ORDER — GLYCOPYRROLATE 0.2 MG/ML IJ SOLN
INTRAMUSCULAR | Status: AC
  Filled 2022-10-23: qty 1

## 2022-10-23 MED ORDER — SODIUM CHLORIDE (PF) 0.9 % IJ SOLN
INTRAMUSCULAR | Status: DC | PRN
  Administered 2022-10-23: 60 mL via INTRAMUSCULAR

## 2022-10-23 MED ORDER — FENTANYL CITRATE (PF) 100 MCG/2ML IJ SOLN
25.0000 ug | INTRAMUSCULAR | Status: DC | PRN
  Administered 2022-10-23 (×4): 25 ug via INTRAVENOUS

## 2022-10-23 MED ORDER — DEXAMETHASONE SODIUM PHOSPHATE 10 MG/ML IJ SOLN
INTRAMUSCULAR | Status: DC | PRN
  Administered 2022-10-23: 10 mg via INTRAVENOUS

## 2022-10-23 MED ORDER — OXYCODONE HCL 5 MG PO TABS
5.0000 mg | ORAL_TABLET | Freq: Once | ORAL | Status: AC | PRN
  Administered 2022-10-23: 5 mg via ORAL

## 2022-10-23 MED ORDER — SODIUM CHLORIDE FLUSH 0.9 % IV SOLN
INTRAVENOUS | Status: AC
  Filled 2022-10-23: qty 20

## 2022-10-23 MED ORDER — OXYCODONE HCL 5 MG PO TABS
ORAL_TABLET | ORAL | Status: AC
  Filled 2022-10-23: qty 1

## 2022-10-23 MED ORDER — BUPIVACAINE LIPOSOME 1.3 % IJ SUSP
INTRAMUSCULAR | Status: AC
  Filled 2022-10-23: qty 20

## 2022-10-23 MED ORDER — CEFAZOLIN SODIUM-DEXTROSE 2-4 GM/100ML-% IV SOLN
2.0000 g | Freq: Once | INTRAVENOUS | Status: AC
  Administered 2022-10-23: 2 g via INTRAVENOUS

## 2022-10-23 MED ORDER — PROPOFOL 10 MG/ML IV BOLUS
INTRAVENOUS | Status: DC | PRN
  Administered 2022-10-23: 150 mg via INTRAVENOUS

## 2022-10-23 MED ORDER — ACETAMINOPHEN 10 MG/ML IV SOLN: 1000.0000 mg | Freq: Once | INTRAVENOUS | Status: DC | PRN

## 2022-10-23 MED ORDER — DROPERIDOL 2.5 MG/ML IJ SOLN: 0.6250 mg | Freq: Once | INTRAMUSCULAR | Status: DC | PRN

## 2022-10-23 MED ORDER — PHENYLEPHRINE HCL-NACL 20-0.9 MG/250ML-% IV SOLN
INTRAVENOUS | Status: DC | PRN
  Administered 2022-10-23: 30 ug/min via INTRAVENOUS

## 2022-10-23 MED ORDER — BUPIVACAINE-EPINEPHRINE (PF) 0.5% -1:200000 IJ SOLN
INTRAMUSCULAR | Status: DC | PRN
  Administered 2022-10-23: 2 mL

## 2022-10-23 MED ORDER — GLYCOPYRROLATE 0.2 MG/ML IJ SOLN
INTRAMUSCULAR | Status: DC | PRN
  Administered 2022-10-23: .2 mg via INTRAVENOUS

## 2022-10-23 MED ORDER — PROPOFOL 10 MG/ML IV BOLUS
INTRAVENOUS | Status: AC
  Filled 2022-10-23: qty 20

## 2022-10-23 MED ORDER — FAMOTIDINE 20 MG PO TABS
20.0000 mg | ORAL_TABLET | Freq: Once | ORAL | Status: AC
  Administered 2022-10-23: 20 mg via ORAL

## 2022-10-23 MED ORDER — ORAL CARE MOUTH RINSE: 15.0000 mL | Freq: Once | OROMUCOSAL | Status: AC

## 2022-10-23 MED ORDER — METHOCARBAMOL 500 MG PO TABS
500.0000 mg | ORAL_TABLET | Freq: Three times a day (TID) | ORAL | 0 refills | Status: DC | PRN
Start: 2022-10-23 — End: 2023-04-19

## 2022-10-23 MED ORDER — PHENYLEPHRINE HCL-NACL 20-0.9 MG/250ML-% IV SOLN
INTRAVENOUS | Status: AC
  Filled 2022-10-23: qty 250

## 2022-10-23 MED ORDER — CHLORHEXIDINE GLUCONATE 0.12 % MT SOLN
15.0000 mL | Freq: Once | OROMUCOSAL | Status: AC
  Administered 2022-10-23: 15 mL via OROMUCOSAL

## 2022-10-23 MED ORDER — EPINEPHRINE PF 1 MG/ML IJ SOLN
INTRAMUSCULAR | Status: AC
  Filled 2022-10-23: qty 1

## 2022-10-23 MED ORDER — CHLORHEXIDINE GLUCONATE 0.12 % MT SOLN
OROMUCOSAL | Status: AC
  Filled 2022-10-23: qty 15

## 2022-10-23 MED ORDER — FAMOTIDINE 20 MG PO TABS
ORAL_TABLET | ORAL | Status: AC
  Filled 2022-10-23: qty 1

## 2022-10-23 MED ORDER — LIDOCAINE HCL (CARDIAC) PF 100 MG/5ML IV SOSY
PREFILLED_SYRINGE | INTRAVENOUS | Status: DC | PRN
  Administered 2022-10-23: 100 mg via INTRAVENOUS

## 2022-10-23 MED ORDER — REMIFENTANIL HCL 1 MG IV SOLR
INTRAVENOUS | Status: AC
  Filled 2022-10-23: qty 1000

## 2022-10-23 MED ORDER — BUPIVACAINE HCL (PF) 0.5 % IJ SOLN
INTRAMUSCULAR | Status: AC
  Filled 2022-10-23: qty 60

## 2022-10-23 MED ORDER — METHYLPREDNISOLONE ACETATE 40 MG/ML IJ SUSP
INTRAMUSCULAR | Status: AC
  Filled 2022-10-23: qty 1

## 2022-10-23 MED ORDER — OXYCODONE HCL 5 MG PO TABS: 5.0000 mg | ORAL_TABLET | ORAL | 0 refills | Status: AC | PRN

## 2022-10-23 SURGICAL SUPPLY — 46 items
ADH SKN CLS APL DERMABOND .7 (GAUZE/BANDAGES/DRESSINGS) ×1
AGENT HMST KT MTR STRL THRMB (HEMOSTASIS) ×1
APL PRP STRL LF DISP 70% ISPRP (MISCELLANEOUS) ×1
BASIN KIT SINGLE STR (MISCELLANEOUS) ×1 IMPLANT
BUR NEURO DRILL SOFT 3.0X3.8M (BURR) ×1 IMPLANT
CHLORAPREP W/TINT 26 (MISCELLANEOUS) ×1 IMPLANT
CNTNR URN SCR LID CUP LEK RST (MISCELLANEOUS) ×1 IMPLANT
CONT SPEC 4OZ STRL OR WHT (MISCELLANEOUS) ×1
DERMABOND ADVANCED .7 DNX12 (GAUZE/BANDAGES/DRESSINGS) ×1 IMPLANT
DRAPE C ARM PK CFD 31 SPINE (DRAPES) ×1 IMPLANT
DRAPE LAPAROTOMY 100X77 ABD (DRAPES) ×1 IMPLANT
DRAPE MICROSCOPE SPINE 48X150 (DRAPES) ×1 IMPLANT
DRAPE SURG 17X11 SM STRL (DRAPES) ×1 IMPLANT
DRSG OPSITE POSTOP 3X4 (GAUZE/BANDAGES/DRESSINGS) IMPLANT
ELECT EZSTD 165MM 6.5IN (MISCELLANEOUS) ×1
ELECT REM PT RETURN 9FT ADLT (ELECTROSURGICAL) ×1
ELECTRODE EZSTD 165MM 6.5IN (MISCELLANEOUS) IMPLANT
ELECTRODE REM PT RTRN 9FT ADLT (ELECTROSURGICAL) ×1 IMPLANT
GLOVE BIOGEL PI IND STRL 6.5 (GLOVE) ×1 IMPLANT
GLOVE BIOGEL PI IND STRL 8.5 (GLOVE) ×2 IMPLANT
GLOVE SURG SYN 6.5 ES PF (GLOVE) ×2 IMPLANT
GLOVE SURG SYN 6.5 PF PI (GLOVE) ×2 IMPLANT
GLOVE SURG SYN 8.5  E (GLOVE) ×3
GLOVE SURG SYN 8.5 E (GLOVE) ×3 IMPLANT
GLOVE SURG SYN 8.5 PF PI (GLOVE) ×3 IMPLANT
GOWN SRG LRG LVL 4 IMPRV REINF (GOWNS) ×1 IMPLANT
GOWN SRG XL LVL 3 NONREINFORCE (GOWNS) ×1 IMPLANT
GOWN STRL NON-REIN TWL XL LVL3 (GOWNS) ×1
GOWN STRL REIN LRG LVL4 (GOWNS) ×1
KIT SPINAL PRONEVIEW (KITS) ×1 IMPLANT
MANIFOLD NEPTUNE II (INSTRUMENTS) ×1 IMPLANT
MARKER SKIN DUAL TIP RULER LAB (MISCELLANEOUS) ×1 IMPLANT
NDL SAFETY ECLIP 18X1.5 (MISCELLANEOUS) ×1 IMPLANT
NS IRRIG 500ML POUR BTL (IV SOLUTION) IMPLANT
PACK LAMINECTOMY NEURO (CUSTOM PROCEDURE TRAY) ×1 IMPLANT
PAD ARMBOARD 7.5X6 YLW CONV (MISCELLANEOUS) ×1 IMPLANT
SURGIFLO W/THROMBIN 8M KIT (HEMOSTASIS) ×1 IMPLANT
SUT DVC VLOC 3-0 CL 6 P-12 (SUTURE) ×1 IMPLANT
SUT VIC AB 0 CT1 27 (SUTURE) ×1
SUT VIC AB 0 CT1 27XCR 8 STRN (SUTURE) ×1 IMPLANT
SUT VIC AB 2-0 CT1 18 (SUTURE) ×1 IMPLANT
SYR 10ML LL (SYRINGE) ×2 IMPLANT
SYR 30ML LL (SYRINGE) ×2 IMPLANT
SYR 3ML LL SCALE MARK (SYRINGE) ×1 IMPLANT
TRAP FLUID SMOKE EVACUATOR (MISCELLANEOUS) ×1 IMPLANT
WATER STERILE IRR 1000ML POUR (IV SOLUTION) ×2 IMPLANT

## 2022-10-23 NOTE — Discharge Instructions (Addendum)
Your surgeon has performed an operation on your lumbar spine (low back) to relieve pressure on one or more nerves. Many times, patients feel better immediately after surgery and can "overdo it." Even if you feel well, it is important that you follow these activity guidelines. If you do not let your back heal properly from the surgery, you can increase the chance of a disc herniation and/or return of your symptoms. The following are instructions to help in your recovery once you have been discharged from the hospital.  * It is ok to take NSAIDs after surgery.  Activity    No bending, lifting, or twisting ("BLT"). Avoid lifting objects heavier than 10 pounds (gallon milk jug).  Where possible, avoid household activities that involve lifting, bending, pushing, or pulling such as laundry, vacuuming, grocery shopping, and childcare. Try to arrange for help from friends and family for these activities while your back heals.  Increase physical activity slowly as tolerated.  Taking short walks is encouraged, but avoid strenuous exercise. Do not jog, run, bicycle, lift weights, or participate in any other exercises unless specifically allowed by your doctor. Avoid prolonged sitting, including car rides.  Talk to your doctor before resuming sexual activity.  You should not drive until cleared by your doctor.  Until released by your doctor, you should not return to work or school.  You should rest at home and let your body heal.   You may shower three days after your surgery.  After showering, lightly dab your incision dry. Do not take a tub bath or go swimming for 3 weeks, or until approved by your doctor at your follow-up appointment.  If you smoke, we strongly recommend that you quit.  Smoking has been proven to interfere with normal healing in your back and will dramatically reduce the success rate of your surgery. Please contact [facility] (800-QUIT-NOW) and use the resources at www.[facility].com for  assistance in stopping smoking.  Surgical Incision   If you have a dressing on your incision, you may remove it three days after your surgery. Keep your incision area clean and dry.  If you have staples or stitches on your incision, you should have a follow up scheduled for removal. If you do not have staples or stitches, you will have steri-strips (small pieces of surgical tape) or Dermabond glue. The steri-strips/glue should begin to peel away within about a week (it is fine if the steri-strips fall off before then). If the strips are still in place one week after your surgery, you may gently remove them.  Diet            You may return to your usual diet. Be sure to stay hydrated.  When to Contact Us  Although your surgery and recovery will likely be uneventful, you may have some residual numbness, aches, and pains in your back and/or legs. This is normal and should improve in the next few weeks.  However, should you experience any of the following, contact us immediately: New numbness or weakness Pain that is progressively getting worse, and is not relieved by your pain medications or rest Bleeding, redness, swelling, pain, or drainage from surgical incision Chills or flu-like symptoms Fever greater than 101.0 F (38.3 C) Problems with bowel or bladder functions Difficulty breathing or shortness of breath Warmth, tenderness, or swelling in your calf  Contact Information During office hours (Monday-Friday 9 am to 5 pm), please call your physician at [phone] and ask for Kendelyn Jean After hours and   weekends, please call [phone] and speak with the neurosurgeon on call For a life-threatening emergency, call 911   AMBULATORY SURGERY  DISCHARGE INSTRUCTIONS   The drugs that you were given will stay in your system until tomorrow so for the next 24 hours you should not:  Drive an automobile Make any legal decisions Drink any alcoholic beverage   You may resume regular  meals tomorrow.  Today it is better to start with liquids and gradually work up to solid foods.  You may eat anything you prefer, but it is better to start with liquids, then soup and crackers, and gradually work up to solid foods.   Please notify your doctor immediately if you have any unusual bleeding, trouble breathing, redness and pain at the surgery site, drainage, fever, or pain not relieved by medication.    Additional Instructions: PLEASE LEAVE TEAL (EXPAREL) ARMBAND ON FOR 4 DAYS   Please contact your physician with any problems or Same Day Surgery at [phone], Monday through Friday 6 am to 4 pm, or [place] at [facility] number at [phone].

## 2022-10-23 NOTE — Transfer of Care (Signed)
Immediate Anesthesia Transfer of Care Note  Patient: Karen [name]  Procedure(s) Performed: RIGHT L4-5 LAMINOFORAMINOTOMY (Right: Spine Lumbar)  Patient Location: PACU  Anesthesia Type:General  Level of Consciousness: drowsy  Airway & Oxygen Therapy: Patient Spontanous Breathing and Patient connected to face mask oxygen  Post-op Assessment: Report given to RN and Post -op Vital signs reviewed and stable  Post vital signs: Reviewed and stable  Last Vitals:  Vitals Value Taken Time  BP 130/88 10/23/22 0907  Temp    Pulse 97 10/23/22 0909  Resp    SpO2 95 % 10/23/22 0909  Vitals shown include unvalidated device data.  Last Pain:  Vitals:   10/23/22 0634  TempSrc: Temporal  PainSc: 4          Complications: No notable events documented.

## 2022-10-23 NOTE — Op Note (Signed)
Indications: 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 The patient tried and failed conservative management, prompting surgical intervention.  Findings: compression of R side of canal  Preoperative Diagnosis: Lumbar Stenosis with neurogenic claudication Postoperative Diagnosis: same   EBL: 10 ml IVF: see AR ml Drains: none Disposition: Extubated and Stable to PACU Complications: none  No foley catheter was placed.   Preoperative Note:  Risks of surgery discussed include: infection, bleeding, stroke, coma, death, paralysis, CSF leak, nerve/spinal cord injury, numbness, tingling, weakness, complex regional pain syndrome, recurrent stenosis and/or disc herniation, vascular injury, development of instability, neck/back pain, need for further surgery, persistent symptoms, development of deformity, and the risks of anesthesia. The patient understood these risks and agreed to proceed.  Operative Note:   1. Right L4-5 lumbar decompression including central laminectomy and bilateral medial facetectomies including foraminotomies  The patient was then brought from the preoperative center with intravenous access established.  The patient underwent general anesthesia and endotracheal tube intubation, and was then rotated on the [place] rail top where all pressure points were appropriately padded.  The skin was then thoroughly cleansed.  Perioperative antibiotic prophylaxis was administered.  Sterile prep and drapes were then applied and a timeout was then observed.  C-arm was brought into the field under sterile conditions and under lateral visualization the L4-5 interspace was identified and marked.  The incision was marked on the right and injected with local anesthetic. Once this was complete a 2 cm incision was opened with the use of a #10 blade knife.    The metrx tubes were sequentially advanced and confirmed in position at L4-5. An  18mm by 80mm tube was locked in place to the bed side attachment.  The microscope was then sterilely brought into the field and muscle creep was hemostased with a bipolar and resected with a pituitary rongeur.  A Bovie extender was then used to expose the spinous process and lamina.  Careful attention was placed to not violate the facet capsule. A 3 mm matchstick drill bit was then used to make a hemi-laminotomy trough until the ligamentum flavum was exposed.  This was extended to the base of the spinous process and to the contralateral side to remove all the central bone from each side.  Once this was complete and the underlying ligamentum flavum was visualized, it was dissected with a curette and resected with Kerrison rongeurs.  Extensive ligamentum hypertrophy was noted, requiring a substantial amount of time and care for removal.  The dura was identified and palpated. The kerrison rongeur was then used to remove the medial facet bilaterally until no compression was noted.  A small amount of soft tissue deep to the root was also removed. A balltip probe was used to confirm decompression of the ipsilateral L5 nerve root.  No CSF leak was noted.  Depo-Medrol was placed on the nerve root.  The wound was copiously irrigated. The tube system was then removed under microscopic visualization and hemostasis was obtained with a bipolar.    The fascial layer was reapproximated with the use of a 0 Vicryl suture.  Subcutaneous tissue layer was reapproximated using 2-0 Vicryl suture.  3-0 monocryl was placed in subcuticular fashion. The skin was then cleansed and Dermabond was used to close the skin opening.  Patient was then rotated back to the preoperative bed awakened from anesthesia and taken to recovery all counts are correct in this case.  I performed the entire procedure with the  assistance of Manning Charity PA as an Designer, television/film set. An assistant was required for this procedure due to the complexity.  The  assistant provided assistance in tissue manipulation and suction, and was required for the successful and safe performance of the procedure. I performed the critical portions of the procedure.   Mehreen Azizi K. Myer Haff MD

## 2022-10-23 NOTE — Anesthesia Preprocedure Evaluation (Signed)
Anesthesia Evaluation  Patient identified by MRN, date of birth, ID band Patient awake    Reviewed: Allergy & Precautions, H&P , NPO status , Patient's Chart, lab work & pertinent test results, reviewed documented beta blocker date and time   Airway Mallampati: III  TM Distance: >3 FB Neck ROM: full    Dental  (+) Teeth Intact, Poor Dentition   Pulmonary sleep apnea and Continuous Positive Airway Pressure Ventilation    Pulmonary exam normal        Cardiovascular Exercise Tolerance: Poor hypertension, On Medications Normal cardiovascular exam+ Valvular Problems/Murmurs  Rhythm:regular Rate:Normal     Neuro/Psych  PSYCHIATRIC DISORDERS  Depression    negative neurological ROS     GI/Hepatic Neg liver ROS,GERD  Medicated,,  Endo/Other  Hypothyroidism  Morbid obesity  Renal/GU negative Renal ROS  negative genitourinary   Musculoskeletal   Abdominal   Peds  Hematology negative hematology ROS (+)   Anesthesia Other Findings Past Medical History: No date: Acquired trigger finger of both middle fingers No date: Arthritis 2011: Breast cancer, right (HCC)     Comment:  Right Lumpectomy and Rad tx's. 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, post-menopausal No date: Pre-diabetes No date: RLS (restless legs syndrome) No date: Sleep apnea     Comment:  not using cpap machine-feels like she is drowning Past Surgical History: No date: ABDOMINAL HYSTERECTOMY No date: APPENDECTOMY No date: BREAST SURGERY 11/25/2014: CARDIAC CATHETERIZATION; N/A     Comment:  Procedure: Left Heart Cath;  Surgeon: Dalia Heading,                MD;  Location: [facility] INVASIVE CV LAB;  Service:               Cardiovascular;  Laterality: N/A; No date: CHOLECYSTECTOMY 08/05/2018: COLONOSCOPY WITH PROPOFOL; N/A     Comment:  Procedure: COLONOSCOPY WITH PROPOFOL;  Surgeon:               Christena Deem, MD;  Location: [facility];                Service: Endoscopy;  Laterality: N/A; No date: MASTECTOMY; Right     Comment:  partial/lumpectomy   Reproductive/Obstetrics negative OB ROS                             Anesthesia Physical Anesthesia Plan  ASA: 3  Anesthesia Plan: General ETT   Post-op Pain Management:    Induction:   PONV Risk Score and Plan: 4 or greater  Airway Management Planned:   Additional Equipment:   Intra-op Plan:   Post-operative Plan:   Informed Consent: I have reviewed the patients History and Physical, chart, labs and discussed the procedure including the risks, benefits and alternatives for the proposed anesthesia with the patient or authorized representative who has indicated his/her understanding and acceptance.     Dental Advisory Given  Plan Discussed with: CRNA  Anesthesia Plan Comments:         Anesthesia Quick Evaluation

## 2022-10-23 NOTE — Discharge Summary (Signed)
Discharge Summary  Patient ID: ROSELENA REYMAN MRN: [id number] DOB/AGE: December 11, 1950 72 y.o.  Admit date: 10/23/2022 Discharge date: 10/23/2022  Admission Diagnoses: neurogenic claudication (ICD10 M48.062).  Discharge Diagnoses:  Active Problems:   * No active hospital problems. *   Discharged Condition: good  Hospital Course:  CALLEN KOZIEL is a 72 y.o presenting with neurogenic claudication status post right L4-5 decompression.  Her intraoperative course was uncomplicated.  She was monitored in PACU and discharged home after ambulating, urinating, and tolerating p.o. intake.  She was given prescriptions for pain medication and muscle relaxer.  Consults: None  Significant Diagnostic Studies: none  Treatments: surgery: as above.  Please see separately dictated operative report for further details.  Discharge Exam: Blood pressure (!) 142/90, pulse 78, temperature (!) 97.1 F (36.2 C), temperature source Temporal, resp. rate 17, SpO2 97 %. CN II- XII grossly intact 5/5 throughout BLE Incision c/d/i  Disposition: Discharge disposition: 01-Home or Self Care        Allergies as of 10/23/2022   No Known Allergies      Medication List     TAKE these medications    atorvastatin 20 MG tablet Commonly known as: LIPITOR Take 20 mg by mouth at bedtime.   colchicine 0.6 MG tablet Take 0.6 mg by mouth as needed.   CREON PO Take 3 tablets by mouth with breakfast, with lunch, and with evening meal.   DULoxetine 30 MG capsule Commonly known as: CYMBALTA Take 30 mg by mouth every morning.   fluconazole 150 MG tablet Commonly known as: Diflucan Take 1 tablet (150 mg) PO x 1 dose. May repeat 150 mg dose in 3 days if still symptomatic.   furosemide 20 MG tablet Commonly known as: LASIX Take 20 mg by mouth as needed.   ibuprofen 200 MG tablet Commonly known as: ADVIL Take 400 mg by mouth every 6 (six) hours as needed.   levothyroxine 75 MCG tablet Commonly known  as: SYNTHROID Take 75 mcg by mouth daily before breakfast.   lisinopril 20 MG tablet Commonly known as: ZESTRIL Take 20 mg by mouth every morning.   metFORMIN 500 MG 24 hr tablet Commonly known as: GLUCOPHAGE-XR Take 500 mg by mouth every evening.   methocarbamol 500 MG tablet Commonly known as: ROBAXIN Take 1 tablet (500 mg total) by mouth every 8 (eight) hours as needed. What changed: See the new instructions.   Myrbetriq 50 MG Tb24 tablet Generic drug: mirabegron ER Take 50 mg by mouth every morning.   oxyCODONE 5 MG immediate release tablet Commonly known as: Roxicodone Take 1 tablet (5 mg total) by mouth every 4 (four) hours as needed for up to 5 days for severe pain.   phenazopyridine 200 MG tablet Commonly known as: PYRIDIUM Take 1 tablet (200 mg total) by mouth 3 (three) times daily as needed for pain.   pramipexole 1 MG tablet Commonly known as: MIRAPEX Take 1 mg by mouth in the morning and at bedtime.   pregabalin 75 MG capsule Commonly known as: LYRICA Take 75-150 mg by mouth 2 (two) times daily. 1 tab in am and 2 in the evening   Vitamin D (Ergocalciferol) 1.25 MG (50000 UNIT) Caps capsule Commonly known as: DRISDOL Take 50,000 Units by mouth once a week. Wednesdays        Follow-up Information     Drake Leach, PA-C Follow up on 11/07/2022.   Specialty: Neurosurgery Contact information: [address] [place] [address] [phone]  Signed: Susanne Borders 10/23/2022, 9:00 AM

## 2022-10-23 NOTE — Anesthesia Procedure Notes (Signed)
Procedure Name: Intubation Date/Time: 10/23/2022 7:25 AM  Performed by: Joanette Gula, Aliahna Statzer, CRNAPre-anesthesia Checklist: Patient identified, Emergency Drugs available, Suction available and Patient being monitored Patient Re-evaluated:Patient Re-evaluated prior to induction Oxygen Delivery Method: Circle system utilized Preoxygenation: Pre-oxygenation with 100% oxygen Induction Type: IV induction Ventilation: Mask ventilation without difficulty Laryngoscope Size: McGraph and 3 Grade View: Grade I Tube type: Oral Tube size: 7.0 mm Number of attempts: 1 Airway Equipment and Method: Stylet Placement Confirmation: ETT inserted through vocal cords under direct vision, positive ETCO2 and breath sounds checked- equal and bilateral Secured at: 23 cm Tube secured with: Tape Dental Injury: Teeth and Oropharynx as per pre-operative assessment

## 2022-10-23 NOTE — Progress Notes (Signed)
Referring Physician:  No referring provider defined for this encounter.  Primary Physician:  [facility]  History of Present Illness: 10/23/2022 Karen [name] presents today with continued symptoms.  08/10/2022 Karen [name] returns to see me.  She continues to have symptoms down her right leg.  She has tried conservative management which did not help.  06/22/2022 Karen [name] returns to see me.  She continues to have symptoms of neurogenic claudication.  She was recently in [place] visiting family.  She had an injection which helped her somewhat.  02/23/2022 Karen [name] is here today with a chief complaint of numbness in her fingers and hands.  She is also having some balance issues.  She reports urge incontinence, but that has improved with overactive bladder medication.  She reports low back pain and discomfort into her legs particularly when she walks or stands in 1 position.  When she sits down, her symptoms improved.  She does have to use a cart [place] the market.  Her symptoms have been worsening over the past year but have been present for several years.  She reports shocklike and stabbing pain as bad as 9 out of 10 made worse by walking, standing, and prolonged sitting.  Bending forward and laying down make it better.   Bowel/Bladder Dysfunction: none  Conservative measures:  Physical therapy:  has not participated Multimodal medical therapy including regular antiinflammatories: lyrica, meloxicam, prednisone, tramadol, ibuprofen Injections: has not received epidural steroid injections?  Past Surgery: denies  MYSTICAL STORZ has no symptoms of cervical myelopathy.  The symptoms are causing a significant impact on the patient's life.   Review of Systems:  A 10 point review of systems is negative, except for the pertinent positives and negatives detailed in the HPI.  Past Medical History: Past Medical History:  Diagnosis Date   Acquired  trigger finger of both middle fingers    Arthritis    Breast cancer, right (HCC) 2011   Right Lumpectomy and Rad tx's.   Depression    DVT (deep venous thrombosis) (HCC)    GERD (gastroesophageal reflux disease)    Heart murmur    Heart palpitations    Hyperlipidemia    Hypertension    Hypothyroidism    Leg edema    Narcolepsy and cataplexy    Neuropathy    Obesity    Osteoporosis, post-menopausal    Pre-diabetes    RLS (restless legs syndrome)    Sleep apnea    not using cpap machine-feels like she is drowning    Past Surgical History: Past Surgical History:  Procedure Laterality Date   ABDOMINAL HYSTERECTOMY     APPENDECTOMY     BREAST SURGERY     CARDIAC CATHETERIZATION N/A 11/25/2014   Procedure: Left Heart Cath;  Surgeon: Dalia Heading, MD;  Location: [facility] INVASIVE CV LAB;  Service: Cardiovascular;  Laterality: N/A;   CHOLECYSTECTOMY     COLONOSCOPY WITH PROPOFOL N/A 08/05/2018   Procedure: COLONOSCOPY WITH PROPOFOL;  Surgeon: Christena Deem, MD;  Location: [facility] ENDOSCOPY;  Service: Endoscopy;  Laterality: N/A;   MASTECTOMY Right    partial/lumpectomy    Allergies: Allergies as of 09/22/2022   (No Known Allergies)    Medications: Current Facility-Administered Medications for the 10/23/22 encounter [facility] Encounter)  Medication   betamethasone acetate-betamethasone sodium phosphate (CELESTONE) injection 3 mg   betamethasone acetate-betamethasone sodium phosphate (CELESTONE) injection 3 mg   betamethasone acetate-betamethasone sodium phosphate (CELESTONE) injection 3 mg  Current Meds  Medication Sig   atorvastatin (LIPITOR) 20 MG tablet Take 20 mg by mouth at bedtime.   colchicine 0.6 MG tablet Take 0.6 mg by mouth as needed.   DULoxetine (CYMBALTA) 30 MG capsule Take 30 mg by mouth every morning.   furosemide (LASIX) 20 MG tablet Take 20 mg by mouth as needed.   ibuprofen (ADVIL) 200 MG tablet Take 400 mg by mouth every 6 (six) hours as needed.    levothyroxine (SYNTHROID, LEVOTHROID) 75 MCG tablet Take 75 mcg by mouth daily before breakfast.   lisinopril (PRINIVIL,ZESTRIL) 20 MG tablet Take 20 mg by mouth every morning.   mirabegron ER (MYRBETRIQ) 50 MG TB24 tablet Take 50 mg by mouth every morning.   pramipexole (MIRAPEX) 1 MG tablet Take 1 mg by mouth in the morning and at bedtime.   pregabalin (LYRICA) 75 MG capsule Take 75-150 mg by mouth 2 (two) times daily. 1 tab in am and 2 in the evening    Social History: Social History   Tobacco Use   Smoking status: Never   Smokeless tobacco: Never  Vaping Use   Vaping Use: Never used  Substance Use Topics   Alcohol use: Not Currently    Alcohol/week: 5.0 standard drinks of alcohol    Types: 5 Shots of liquor per week   Drug use: No    Family Medical History: Family History  Problem Relation Age of Onset   Cancer Mother    Hypertension Mother    Hypertension Father    Heart disease Father    Emphysema Father    Depression Father    Cancer Sister    Hypertension Sister     Physical Examination: Vitals:   10/23/22 0634  BP: (!) 142/90  Pulse: 78  Resp: 17  Temp: (!) 97.1 F (36.2 C)  SpO2: 97%   Heart sounds normal no MRG. Chest Clear to Auscultation Bilaterally.  General: Patient is well developed, well nourished, calm, collected, and in no apparent distress. Attention to examination is appropriate.  Neck:   Supple.  Full range of motion.  Respiratory: Patient is breathing without any difficulty.   NEUROLOGICAL:     Awake, alert, oriented to person, place, and time.  Speech is clear and fluent. Fund of knowledge is appropriate.   Cranial Nerves: Pupils equal round and reactive to light.  Facial tone is symmetric.  Facial sensation is symmetric. Shoulder shrug is symmetric. Tongue protrusion is midline.  There is no pronator drift.  ROM of spine: full.    Strength: Side Biceps Triceps Deltoid Interossei Grip Wrist Ext. Wrist Flex.  R 5 5 5 5 5 5 5   L 5  5 5 5 5 5 5    Side Iliopsoas Quads Hamstring PF DF EHL  R 5 5 5 5 5 5   L 5 5 5 5 5 5    Reflexes are 1+ and symmetric at the biceps, triceps, brachioradialis, patella and achilles.   Hoffman's is absent.  Clonus is not present.  Toes are down-going.  Bilateral upper and lower extremity sensation is intact to light touch.    No evidence of dysmetria noted.    Medical Decision Making  Imaging: MRI C spine 01/26/22 IMPRESSION:  Multilevel cervical spondylosis as described above with multiple levels of  moderate to severe neural foraminal stenosis from C3 to C7, and moderate  spinal canal stenosis from C5-C6 and C6-C7.   Electronically Signed by:  Loreli Slot, MD, [facility]  Electronically Signed on:  01/26/2022 5:30 PM  MRI L spine 01/20/22 IMPRESSION:  1.  Severe L4-L5 spinal canal stenosis with compression of the thecal sac  and cauda equina. Findings do not appear significantly changed compared to  CT 11/24/2019. Correlate clinically for signs/symptoms of cauda equina  syndrome.  2.  Moderate L2-L3 spinal canal stenosis. Additional degenerative changes  as above.    Electronically Signed by:  Mal Amabile, MD, [facility]  Electronically Signed on:  01/20/2022 9:18 PM  I have personally reviewed the images and agree with the above interpretation.  Assessment and Plan: Ms. Reigel is a pleasant 72 y.o. 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  At this point, her symptoms are persistent after failing conservative management.  We will proceed with right L4-5 decompression.   Freedom Lopezperez K. Myer Haff MD, [facility] Neurosurgery

## 2022-10-24 ENCOUNTER — Ambulatory Visit: Payer: Medicare Other | Admitting: Podiatry

## 2022-10-24 ENCOUNTER — Encounter: Payer: Self-pay | Admitting: Neurosurgery

## 2022-10-25 NOTE — Anesthesia Postprocedure Evaluation (Signed)
Anesthesia Post Note  Patient: TAYVEN LEYDON  Procedure(s) Performed: RIGHT L4-5 LAMINOFORAMINOTOMY (Right: Spine Lumbar)  Patient location during evaluation: PACU Anesthesia Type: General Level of consciousness: awake and alert Pain management: pain level controlled Vital Signs Assessment: post-procedure vital signs reviewed and stable Respiratory status: spontaneous breathing, nonlabored ventilation, respiratory function stable and patient connected to nasal cannula oxygen Cardiovascular status: blood pressure returned to baseline and stable Postop Assessment: no apparent nausea or vomiting Anesthetic complications: no   No notable events documented.   Last Vitals:  Vitals:   10/23/22 1015 10/23/22 1046  BP: (!) 129/91 (!) 154/92  Pulse: 85 87  Resp: 10 16  Temp:  (!) 36.1 C  SpO2: 95% 95%    Last Pain:  Vitals:   10/24/22 0853  TempSrc:   PainSc: 0-No pain                 Yevette Edwards

## 2022-11-01 NOTE — Progress Notes (Signed)
   REFERRING PHYSICIAN:  [place], [facility] [address] [place],  [place] [address]  DOS: 10/23/22  Right L4-5 lumbar decompression including central laminectomy and bilateral medial facetectomies including foraminotomies   HISTORY OF PRESENT ILLNESS: Karen [name] is approximately 2 weeks status post Right L4-5 lumbar decompression including central laminectomy and bilateral medial facetectomies including foraminotomies . Was given robaxin and oxycodone on discharge from the hospital.   Preop right leg is better. She still has some numbness and tingling in her feet. She feels like her balance is 100% better. She has minimal LBP.   She is not taking oxycodone or robaxin.    PHYSICAL EXAMINATION:  General: Patient is well developed, well nourished, calm, collected, and in no apparent distress.   NEUROLOGICAL:  General: In no acute distress.   Awake, alert, oriented to person, place, and time.  Pupils equal round and reactive to light.  Facial tone is symmetric.     Strength:            Side Iliopsoas Quads Hamstring PF DF EHL  R 5 5 5 5 5 5   L 5 5 5 5 5 5    Incision c/d/i   ROS (Neurologic):  Negative except as noted above  IMAGING: Nothing new to review.   ASSESSMENT/PLAN:  Karen [name] is doing well s/p above surgery. Treatment options reviewed with patient and following plan made:   - I have advised the patient to lift up to 10 pounds until 6 weeks after surgery (follow up with Dr. Myer Haff).  - Reviewed wound care.  - No bending, twisting, or lifting.  - She is not taking oxycodone or robaxin.  - Follow up as scheduled in 4 weeks and prn.   Advised to contact the office if any questions or concerns arise.  Drake Leach PA-C Department of neurosurgery

## 2022-11-07 ENCOUNTER — Encounter: Payer: Self-pay | Admitting: Orthopedic Surgery

## 2022-11-07 ENCOUNTER — Ambulatory Visit (INDEPENDENT_AMBULATORY_CARE_PROVIDER_SITE_OTHER): Payer: Medicare Other | Admitting: Orthopedic Surgery

## 2022-11-07 VITALS — BP 124/80 | Temp 97.9°F | Ht 68.0 in | Wt 251.0 lb

## 2022-11-07 DIAGNOSIS — Z09 Encounter for follow-up examination after completed treatment for conditions other than malignant neoplasm: Secondary | ICD-10-CM

## 2022-11-07 DIAGNOSIS — Z9889 Other specified postprocedural states: Secondary | ICD-10-CM

## 2022-11-07 DIAGNOSIS — M48062 Spinal stenosis, lumbar region with neurogenic claudication: Secondary | ICD-10-CM

## 2022-11-24 ENCOUNTER — Ambulatory Visit: Payer: Medicare Other | Admitting: Podiatry

## 2022-11-24 DIAGNOSIS — B351 Tinea unguium: Secondary | ICD-10-CM

## 2022-11-24 DIAGNOSIS — M79674 Pain in right toe(s): Secondary | ICD-10-CM | POA: Diagnosis not present

## 2022-11-24 DIAGNOSIS — M79675 Pain in left toe(s): Secondary | ICD-10-CM

## 2022-11-24 NOTE — Progress Notes (Signed)
   Chief Complaint  Patient presents with   Nail Problem    Thick painful toenails, 3 month follow up    SUBJECTIVE Patient presents to office today complaining of elongated, thickened nails that cause pain while ambulating in shoes.  Patient is unable to trim their own nails.  Patient states that the ulcer to the distal tip of the right third toe has healed.  It is now just a mildly symptomatic callus.  Patient is here for further evaluation and treatment.  Past Medical History:  Diagnosis Date   Acquired trigger finger of both middle fingers    Arthritis    Breast cancer, right (HCC) 2011   Right Lumpectomy and Rad tx's.   Depression    DVT (deep venous thrombosis) (HCC)    GERD (gastroesophageal reflux disease)    Heart murmur    Heart palpitations    Hyperlipidemia    Hypertension    Hypothyroidism    Leg edema    Narcolepsy and cataplexy    Neuropathy    Obesity    Osteoporosis, post-menopausal    Pre-diabetes    RLS (restless legs syndrome)    Sleep apnea    not using cpap machine-feels like she is drowning    No Known Allergies    OBJECTIVE General Patient is awake, alert, and oriented x 3 and in no acute distress. Derm Skin is dry and supple bilateral. Negative open lesions or macerations. Remaining integument unremarkable. Nails are tender, long, thickened and dystrophic with subungual debris, consistent with onychomycosis, 1-5 bilateral. No signs of infection noted.  Hyperkeratotic preulcerative callus noted to the distal tips of the bilateral toes.  No open wounds noted Vasc  DP and PT pedal pulses palpable bilaterally. Temperature gradient within normal limits.  Neuro Epicritic and protective threshold sensation grossly intact bilaterally.  Musculoskeletal Exam No symptomatic pedal deformities noted bilateral. Muscular strength within normal limits.  ASSESSMENT 1.  Pain due to onychomycosis of toenails both 2.  Preulcerative callus lesions distal tips of  the bilateral toes  PLAN OF CARE 1. Patient evaluated today.  2. Instructed to maintain good pedal hygiene and foot care.  3. Mechanical debridement of nails 1-5 bilaterally performed using a nail nipper. Filed with dremel without incident.  4.  Excisional debridement of the hyperkeratotic preulcerative callus tissue was performed using a tissue nipper without incident or bleeding as a courtesy to the patient 5.  Return to clinic in 3 mos.    Felecia Shelling, DPM [facility]  Dr. Felecia Shelling, DPM    [address] [place], [place] [address]                Office [phone]  Fax [phone]

## 2022-12-05 ENCOUNTER — Ambulatory Visit
Admission: RE | Admit: 2022-12-05 | Discharge: 2022-12-05 | Disposition: A | Discharge status: Home / Self Care | Payer: Medicare Other | Source: Ambulatory Visit | Attending: Neurosurgery | Admitting: Neurosurgery

## 2022-12-05 ENCOUNTER — Other Ambulatory Visit: Payer: Self-pay | Admitting: Neurosurgery

## 2022-12-05 ENCOUNTER — Encounter: Payer: Self-pay | Admitting: Neurosurgery

## 2022-12-05 ENCOUNTER — Telehealth: Payer: Self-pay

## 2022-12-05 ENCOUNTER — Ambulatory Visit (INDEPENDENT_AMBULATORY_CARE_PROVIDER_SITE_OTHER): Payer: Medicare Other | Admitting: Neurosurgery

## 2022-12-05 VITALS — BP 134/82 | Temp 98.3°F | Ht 68.0 in | Wt 255.8 lb

## 2022-12-05 DIAGNOSIS — M7989 Other specified soft tissue disorders: Secondary | ICD-10-CM | POA: Insufficient documentation

## 2022-12-05 DIAGNOSIS — I825Y1 Chronic embolism and thrombosis of unspecified deep veins of right proximal lower extremity: Secondary | ICD-10-CM | POA: Diagnosis present

## 2022-12-05 DIAGNOSIS — M7121 Synovial cyst of popliteal space [Baker], right knee: Secondary | ICD-10-CM

## 2022-12-05 DIAGNOSIS — Z09 Encounter for follow-up examination after completed treatment for conditions other than malignant neoplasm: Secondary | ICD-10-CM

## 2022-12-05 DIAGNOSIS — M48062 Spinal stenosis, lumbar region with neurogenic claudication: Secondary | ICD-10-CM

## 2022-12-05 NOTE — Progress Notes (Signed)
   REFERRING PHYSICIAN:  [facility] [address] [place],  [place] [address]  DOS: 10/23/22  Right L4-5 lumbar decompression including central laminectomy and bilateral medial facetectomies including foraminotomies   HISTORY OF PRESENT ILLNESS: Karen [name] is status post Right L4-5 lumbar decompression.  She is having some increasing discomfort in her back and down her legs.  It is similar to that prior to surgery.  She is having some swelling in her right lower leg.  She has history of low deep venous thrombosis.  PHYSICAL EXAMINATION:  General: Patient is well developed, well nourished, calm, collected, and in no apparent distress.   NEUROLOGICAL:  General: In no acute distress.   Awake, alert, oriented to person, place, and time.  Pupils equal round and reactive to light.  Facial tone is symmetric.     Strength:            Side Iliopsoas Quads Hamstring PF DF EHL  R 5 5 5 5 5 5   L 5 5 5 5 5 5    Incision c/d/i  She has relative swelling of her right lower leg versus her left.  It is uncomfortable to palpation.  ROS (Neurologic):  Negative except as noted above  IMAGING: Nothing new to review.   ASSESSMENT/PLAN:  Karen [name] is doing fair s/p above surgery.   For her back and legs, I recommended starting physical therapy.  I am optimistic she will improve.  I am concerned about her right lower extremity swelling given her history of a deep venous thrombosis.  I would recommend getting an ultrasound to see whether she has a clot.    If she has not improved at her next visit, we will discuss reimaging.    Venetia Night Department of neurosurgery

## 2022-12-05 NOTE — Addendum Note (Signed)
Addended by: Ernie Hew on: 12/05/2022 10:11 AM   Modules accepted: Orders

## 2022-12-05 NOTE — Addendum Note (Signed)
Addended by: Ernie Hew on: 12/05/2022 10:14 AM   Modules accepted: Orders

## 2022-12-05 NOTE — Telephone Encounter (Signed)
Per Dr. Myer Haff: Results for DVT US came back negative, however there was mention of a Baker's Cyst., we can refer her to Orthopedics if she would like.   I spoke with the patient and notified her of the results. She verbalized understanding and would like the referral to Ortho. Referral has been placed.

## 2022-12-08 ENCOUNTER — Other Ambulatory Visit: Payer: Self-pay | Admitting: Neurosurgery

## 2022-12-08 ENCOUNTER — Encounter: Payer: Self-pay | Admitting: Neurosurgery

## 2022-12-08 DIAGNOSIS — M5412 Radiculopathy, cervical region: Secondary | ICD-10-CM

## 2022-12-11 ENCOUNTER — Ambulatory Visit
Admission: RE | Admit: 2022-12-11 | Discharge: 2022-12-11 | Disposition: A | Discharge status: Home / Self Care | Payer: Medicare Other | Source: Ambulatory Visit | Attending: Neurosurgery | Admitting: Neurosurgery

## 2022-12-11 ENCOUNTER — Other Ambulatory Visit: Payer: Medicare Other

## 2022-12-11 DIAGNOSIS — M5412 Radiculopathy, cervical region: Secondary | ICD-10-CM

## 2022-12-25 ENCOUNTER — Encounter: Payer: Self-pay | Admitting: Neurosurgery

## 2023-01-03 ENCOUNTER — Other Ambulatory Visit: Payer: Self-pay | Admitting: Neurosurgery

## 2023-01-03 ENCOUNTER — Telehealth: Payer: Self-pay

## 2023-01-03 DIAGNOSIS — G5603 Carpal tunnel syndrome, bilateral upper limbs: Secondary | ICD-10-CM

## 2023-01-03 NOTE — Telephone Encounter (Signed)
-----   Message from [facility] sent at 01/03/2023 10:56 AM EDT ----- Karen [name] ----- Message ----- From: Venetia Night, MD Sent: 01/03/2023  10:55 AM EDT To: Glendale Chard, DO; Antony Madura, MD  Hey-  I just ordered BUE EMGNCV for this patient.  She has history of B CTS with also a history of peripheral neuropathy.    Thanks in advance, Annia Friendly

## 2023-01-03 NOTE — Telephone Encounter (Signed)
Order faxed also.

## 2023-01-04 ENCOUNTER — Encounter: Payer: Self-pay | Admitting: Neurology

## 2023-01-04 ENCOUNTER — Other Ambulatory Visit: Payer: Self-pay

## 2023-01-04 DIAGNOSIS — R202 Paresthesia of skin: Secondary | ICD-10-CM

## 2023-01-08 NOTE — Progress Notes (Unsigned)
   REFERRING PHYSICIAN:  [facility] [address] [place],  [place] [address]  DOS: 10/23/22  Right L4-5 lumbar decompression including central laminectomy and bilateral medial facetectomies including foraminotomies   HISTORY OF PRESENT ILLNESS: Karen [name] is almoast 3 months out from her surgery.   She was doing okay at last visit. Had some right leg swelling- [place] was negative for DVT.   She was sent to PT at her last visit. ***    Also had cervical MRI done. She has EMG scheduled tomorrow and will likely follow up with Dr. Katrinka Blazing.       Preop right leg is better. She still has some numbness and tingling in her feet. She feels like her balance is 100% better. She has minimal LBP.   She is not taking oxycodone or robaxin.    PHYSICAL EXAMINATION:  General: Patient is well developed, well nourished, calm, collected, and in no apparent distress.   NEUROLOGICAL:  General: In no acute distress.   Awake, alert, oriented to person, place, and time.  Pupils equal round and reactive to light.  Facial tone is symmetric.     Strength:            Side Iliopsoas Quads Hamstring PF DF EHL  R 5 5 5 5 5 5   L 5 5 5 5 5 5    Incision well healed   ROS (Neurologic):  Negative except as noted above  IMAGING: Nothing new to review.   ASSESSMENT/PLAN:  CYERRA YIM is doing well s/p above surgery. Treatment options reviewed with patient and following plan made:   - I have advised the patient to lift up to 10 pounds until 6 weeks after surgery (follow up with Dr. Myer Haff).  - Reviewed wound care.  - No bending, twisting, or lifting.  - She is not taking oxycodone or robaxin.  - Follow up as scheduled in 4 weeks and prn.   Advised to contact the office if any questions or concerns arise.  Drake Leach PA-C Department of neurosurgery

## 2023-01-10 NOTE — Telephone Encounter (Signed)
01/12/2023

## 2023-01-11 ENCOUNTER — Encounter: Payer: Self-pay | Admitting: Orthopedic Surgery

## 2023-01-11 ENCOUNTER — Ambulatory Visit (INDEPENDENT_AMBULATORY_CARE_PROVIDER_SITE_OTHER): Payer: Medicare Other | Admitting: Orthopedic Surgery

## 2023-01-11 VITALS — BP 118/80 | Temp 97.9°F | Wt 254.6 lb

## 2023-01-11 DIAGNOSIS — M48062 Spinal stenosis, lumbar region with neurogenic claudication: Secondary | ICD-10-CM

## 2023-01-11 DIAGNOSIS — Z9889 Other specified postprocedural states: Secondary | ICD-10-CM

## 2023-01-11 DIAGNOSIS — Z09 Encounter for follow-up examination after completed treatment for conditions other than malignant neoplasm: Secondary | ICD-10-CM

## 2023-01-12 ENCOUNTER — Ambulatory Visit: Payer: Medicare Other | Admitting: Neurology

## 2023-01-12 DIAGNOSIS — R202 Paresthesia of skin: Secondary | ICD-10-CM | POA: Diagnosis not present

## 2023-01-12 DIAGNOSIS — G5603 Carpal tunnel syndrome, bilateral upper limbs: Secondary | ICD-10-CM

## 2023-01-12 NOTE — Procedures (Signed)
[facility] Neurology  [address], [address]  [place], [place] [address] Tel: [phone] Fax: [phone] Test Date:  01/12/2023  Patient: Karen [name] DOB: [birth year] Physician: Nita Sickle, DO  Sex: Female Height: 5\' 8"  Ref Phys: Venetia Night, MD  ID#: [id number]   Technician:    History: This is a 72 year old female referred for evaluation of bilateral hand paresthesias.  NCV & EMG Findings: Extensive electrodiagnostic testing of the right upper extremity and additional studies of the left shows:  Right median sensory response is absent.  Left median sensory response shows prolonged latency (5.3 ms) and reduced amplitude (7.3 V).  Bilateral ulnar sensory responses are within normal limits. Bilateral median motor responses show prolonged latency (R5.2, L5.5 ms).  Of note there is evidence of bilateral Martin-Gruber anastomosis, a normal anatomic variant.  Bilateral ulnar motor responses are within normal limits.  Chronic motor axon loss changes are isolated to the right abductor pollicis brevis muscle, without accompanying active denervation.  These findings are not present in the left upper extremity.  Impression: Right median neuropathy at or distal to the wrist (severe), consistent with a clinical diagnosis of carpal tunnel syndrome. Left median neuropathy at or distal to the wrist (moderate-to-severe), consistent with a clinical diagnosis of carpal tunnel syndrome. Incidentally, there is evidence of bilateral Martin-Gruber anastomosis, a normal anatomic variant.   ___________________________ Nita Sickle, DO    Nerve Conduction Studies   Stim Site NR Peak (ms) Norm Peak (ms) O-P Amp (V) Norm O-P Amp  Left Median Anti Sensory (2nd Digit)  32 C  Wrist    *5.3 <3.8 *7.3 >10  Right Median Anti Sensory (2nd Digit)  32 C  Wrist *NR  <3.8  >10  Left Ulnar Anti Sensory (5th Digit)  32 C  Wrist    2.8 <3.2 38.9 >5  Right Ulnar Anti Sensory (5th Digit)   32 C  Wrist    2.7 <3.2 37.1 >5     Stim Site NR Onset (ms) Norm Onset (ms) O-P Amp (mV) Norm O-P Amp Site1 Site2 Delta-0 (ms) Dist (cm) Vel (m/s) Norm Vel (m/s)  Left Median Motor (Abd Poll Brev)  32 C  Wrist    *5.5 <4.0 5.5 >5 Elbow Wrist 5.2 29.0 56 >50  Elbow    10.7  5.3  Ullnar-wrist crossover Elbow 7.3 0.0    Ullnar-wrist crossover    3.4  2.3         Right Median Motor (Abd Poll Brev)  32 C  Wrist    *5.2 <4.0 5.7 >5 Elbow Wrist 5.5 29.0 53 >50  Elbow    10.7  5.1  Ulnar-wrist crossover Elbow 5.9 0.0    Ulnar-wrist crossover    4.8  2.6         Left Ulnar Motor (Abd Dig Minimi)  32 C  Wrist    2.0 <3.1 7.6 >7 B Elbow Wrist 3.7 21.0 57 >50  B Elbow    5.7  7.1  A Elbow B Elbow 1.7 10.0 59 >50  A Elbow    7.4  7.1         Right Ulnar Motor (Abd Dig Minimi)  32 C  Wrist    2.7 <3.1 8.1 >7 B Elbow Wrist 3.6 22.0 61 >50  B Elbow    6.3  7.9  A Elbow B Elbow 1.6 10.0 62 >50  A Elbow    7.9  7.9  Electromyography   Side Muscle Ins.Act Fibs Fasc Recrt Amp Dur Poly Activation Comment  Right 1stDorInt Nml Nml Nml Nml Nml Nml Nml Nml N/A  Right Abd Poll Brev Nml Nml Nml *1- *1+ *1+ *1+ Nml N/A  Right PronatorTeres Nml Nml Nml Nml Nml Nml Nml Nml N/A  Right Biceps Nml Nml Nml Nml Nml Nml Nml Nml N/A  Right Triceps Nml Nml Nml Nml Nml Nml Nml Nml N/A  Right Deltoid Nml Nml Nml Nml Nml Nml Nml Nml N/A  Left 1stDorInt Nml Nml Nml Nml Nml Nml Nml Nml N/A  Left Abd Poll Brev Nml Nml Nml Nml Nml Nml Nml Nml N/A  Left PronatorTeres Nml Nml Nml Nml Nml Nml Nml Nml N/A  Left Biceps Nml Nml Nml Nml Nml Nml Nml Nml N/A  Left Triceps Nml Nml Nml Nml Nml Nml Nml Nml N/A  Left Deltoid Nml Nml Nml Nml Nml Nml Nml Nml N/A      Waveforms:

## 2023-01-13 ENCOUNTER — Encounter: Payer: Self-pay | Admitting: Neurosurgery

## 2023-01-15 NOTE — Telephone Encounter (Signed)
She confirmed app with Dr.Smith for 8/6.

## 2023-01-15 NOTE — Progress Notes (Signed)
Referring Physician:  Venetia Night, MD [address] [place],  [place] [address]  Primary Physician:  [facility]  History of Present Illness: 01/23/2023 Karen [name] is here today with a chief complaint of Carpal Tunnel Syndrome.  She has been dealing with this for at least 2 years.  Only feels it bilaterally but the right is worse than the left.  This often wakes her from sleep.  Even during the day she finds herself intermittently trying to shake out her hands.  Starts as tingling but can also produce burning.  This mostly present in the median distribution, she does on the right side get some radiation up into the forearm.  She has tried nighttime bracing.  She has not had injections.  She has a recent EMG which demonstrates severe level symptoms.  She does feel that she has decreased sensation.  She has not previously had carpal tunnel surgery.  Review of Systems:  A 10 point review of systems is negative, except for the pertinent positives and negatives detailed in the HPI.  Past Medical History: Past Medical History:  Diagnosis Date   Acquired trigger finger of both middle fingers    Arthritis    Breast cancer, right (HCC) 2011   Right Lumpectomy and Rad tx's.   Depression    DVT (deep venous thrombosis) (HCC)    GERD (gastroesophageal reflux disease)    Heart murmur    Heart palpitations    Hyperlipidemia    Hypertension    Hypothyroidism    Leg edema    Narcolepsy and cataplexy    Neuropathy    Obesity    Osteoporosis, post-menopausal    Pre-diabetes    RLS (restless legs syndrome)    Sleep apnea    not using cpap machine-feels like she is drowning    Past Surgical History: Past Surgical History:  Procedure Laterality Date   ABDOMINAL HYSTERECTOMY     APPENDECTOMY     BREAST SURGERY     CARDIAC CATHETERIZATION N/A 11/25/2014   Procedure: Left Heart Cath;  Surgeon: Dalia Heading, MD;  Location: [facility] INVASIVE CV  LAB;  Service: Cardiovascular;  Laterality: N/A;   CHOLECYSTECTOMY     COLONOSCOPY WITH PROPOFOL N/A 08/05/2018   Procedure: COLONOSCOPY WITH PROPOFOL;  Surgeon: Christena Deem, MD;  Location[facility] ENDOSCOPY;  Service: Endoscopy;  Laterality: N/A;   FORAMINOTOMY 1 LEVEL Right 10/23/2022   Procedure: RIGHT L4-5 LAMINOFORAMINOTOMY;  Surgeon: Venetia Night, MD;  Location: [facility] ORS;  Service: Neurosurgery;  Laterality: Right;   MASTECTOMY Right    partial/lumpectomy    Allergies: Allergies as of 01/23/2023   (No Known Allergies)    Medications:  Current Outpatient Medications:    atorvastatin (LIPITOR) 20 MG tablet, Take 20 mg by mouth at bedtime., Disp: , Rfl:    DULoxetine (CYMBALTA) 30 MG capsule, Take 30 mg by mouth every morning., Disp: , Rfl:    fluconazole (DIFLUCAN) 150 MG tablet, Take 1 tablet (150 mg) PO x 1 dose. May repeat 150 mg dose in 3 days if still symptomatic., Disp: 2 tablet, Rfl: 0   furosemide (LASIX) 20 MG tablet, Take 20 mg by mouth as needed., Disp: , Rfl:    ibuprofen (ADVIL) 200 MG tablet, Take 400 mg by mouth every 6 (six) hours as needed., Disp: , Rfl:    levothyroxine (SYNTHROID, LEVOTHROID) 75 MCG tablet, Take 75 mcg by mouth daily before breakfast., Disp: , Rfl:  lisinopril (PRINIVIL,ZESTRIL) 20 MG tablet, Take 20 mg by mouth every morning., Disp: , Rfl: 3   metFORMIN (GLUCOPHAGE-XR) 500 MG 24 hr tablet, Take 500 mg by mouth every evening., Disp: , Rfl:    methocarbamol (ROBAXIN) 500 MG tablet, Take 1 tablet (500 mg total) by mouth every 8 (eight) hours as needed., Disp: 90 tablet, Rfl: 0   mirabegron ER (MYRBETRIQ) 50 MG TB24 tablet, Take 50 mg by mouth every morning., Disp: , Rfl:    Pancrelipase, Lip-Prot-Amyl, (CREON PO), Take 3 tablets by mouth with breakfast, with lunch, and with evening meal., Disp: , Rfl:    pramipexole (MIRAPEX) 1 MG tablet, Take 1 mg by mouth in the morning and at bedtime., Disp: , Rfl:    pregabalin (LYRICA) 75 MG capsule,  Take 75-150 mg by mouth 2 (two) times daily. 1 tab in am and 2 in the evening, Disp: , Rfl:    Vitamin D, Ergocalciferol, (DRISDOL) 1.25 MG (50000 UNIT) CAPS capsule, Take 50,000 Units by mouth once a week. Wednesdays, Disp: , Rfl:   Social History: Social History   Tobacco Use   Smoking status: Never   Smokeless tobacco: Never  Vaping Use   Vaping status: Never Used  Substance Use Topics   Alcohol use: Not Currently    Alcohol/week: 5.0 standard drinks of alcohol    Types: 5 Shots of liquor per week   Drug use: No    Family Medical History: Family History  Problem Relation Age of Onset   Cancer Mother    Hypertension Mother    Hypertension Father    Heart disease Father    Emphysema Father    Depression Father    Cancer Sister    Hypertension Sister     Physical Examination: Vitals:   01/23/23 1328  BP: 115/60    General: Patient is in no apparent distress. Attention to examination is appropriate.  Neck:   Supple.  Full range of motion.  Respiratory: Patient is breathing without any difficulty.   NEUROLOGICAL:     Awake, alert, oriented to person, place, and time.  Speech is clear and fluent.   Cranial Nerves: Pupils equal round and reactive to light.  Facial tone is symmetric. Shoulder shrug is symmetric. Tongue protrusion is midline.  There is no pronator drift.  Motor Exam:  She demonstrates some mild thenar atrophy when compared to the contralateral side, she does have a positive Phalen's bilaterally.  Positive carpal compression test bilaterally.  Reflexes are 2+ and symmetric at the biceps, triceps, brachioradialis.  Hoffman's is absent. Clonus is Absent  Decreased sensation in the bilateral median distributions right worse than left  Gait is normal.     Medical Decision Making   Electrodiagnostics:  [facility] Neurology  [address] [place], [address]  [place], [place] [address] Tel: [phone] Fax: [phone] Test Date:  01/12/2023    Patient: Karen [name] DOB: [birth year] Physician: Nita Sickle, DO  Sex: Female Height: 5\' 8"  Ref Phys: Venetia Night, MD  ID#: [id number]     Technician:      History: This is a 72 year old female referred for evaluation of bilateral hand paresthesias.   NCV & EMG Findings: Extensive electrodiagnostic testing of the right upper extremity and additional studies of the left shows:  Right median sensory response is absent.  Left median sensory response shows prolonged latency (5.3 ms) and reduced amplitude (7.3 V).  Bilateral ulnar sensory responses are within normal limits. Bilateral median motor responses show prolonged  latency (R5.2, L5.5 ms).  Of note there is evidence of bilateral Martin-Gruber anastomosis, a normal anatomic variant.  Bilateral ulnar motor responses are within normal limits.  Chronic motor axon loss changes are isolated to the right abductor pollicis brevis muscle, without accompanying active denervation.  These findings are not present in the left upper extremity.   Impression: Right median neuropathy at or distal to the wrist (severe), consistent with a clinical diagnosis of carpal tunnel syndrome. Left median neuropathy at or distal to the wrist (moderate-to-severe), consistent with a clinical diagnosis of carpal tunnel syndrome. Incidentally, there is evidence of bilateral Martin-Gruber anastomosis, a normal anatomic variant.     ___________________________ Nita Sickle, DO     Nerve Conduction Studies    Stim Site NR Peak (ms) Norm Peak (ms) O-P Amp (V) Norm O-P Amp  Left Median Anti Sensory (2nd Digit)  32 C  Wrist    *5.3 <3.8 *7.3 >10  Right Median Anti Sensory (2nd Digit)  32 C  Wrist *NR   <3.8   >10  Left Ulnar Anti Sensory (5th Digit)  32 C  Wrist    2.8 <3.2 38.9 >5  Right Ulnar Anti Sensory (5th Digit)  32 C  Wrist    2.7 <3.2 37.1 >5       Stim Site NR Onset (ms) Norm Onset (ms) O-P Amp (mV) Norm O-P Amp Site1 Site2 Delta-0 (ms)  Dist (cm) Vel (m/s) Norm Vel (m/s)  Left Median Motor (Abd Poll Brev)  32 C  Wrist    *5.5 <4.0 5.5 >5 Elbow Wrist 5.2 29.0 56 >50  Elbow    10.7   5.3   Ullnar-wrist crossover Elbow 7.3 0.0      Ullnar-wrist crossover    3.4   2.3                Right Median Motor (Abd Poll Brev)  32 C  Wrist    *5.2 <4.0 5.7 >5 Elbow Wrist 5.5 29.0 53 >50  Elbow    10.7   5.1   Ulnar-wrist crossover Elbow 5.9 0.0      Ulnar-wrist crossover    4.8   2.6                Left Ulnar Motor (Abd Dig Minimi)  32 C  Wrist    2.0 <3.1 7.6 >7 B Elbow Wrist 3.7 21.0 57 >50  B Elbow    5.7   7.1   A Elbow B Elbow 1.7 10.0 59 >50  A Elbow    7.4   7.1                Right Ulnar Motor (Abd Dig Minimi)  32 C  Wrist    2.7 <3.1 8.1 >7 B Elbow Wrist 3.6 22.0 61 >50  B Elbow    6.3   7.9   A Elbow B Elbow 1.6 10.0 62 >50  A Elbow    7.9   7.9                  Electromyography    Side Muscle Ins.Act Fibs Fasc Recrt Amp Dur Poly Activation Comment  Right 1stDorInt Nml Nml Nml Nml Nml Nml Nml Nml N/A  Right Abd Poll Brev Nml Nml Nml *1- *1+ *1+ *1+ Nml N/A  Right PronatorTeres Nml Nml Nml Nml Nml Nml Nml Nml N/A  Right Biceps Nml Nml Nml Nml Nml Nml Nml Nml N/A  Right Triceps Nml  Nml Nml Nml Nml Nml Nml Nml N/A  Right Deltoid Nml Nml Nml Nml Nml Nml Nml Nml N/A  Left 1stDorInt Nml Nml Nml Nml Nml Nml Nml Nml N/A  Left Abd Poll Brev Nml Nml Nml Nml Nml Nml Nml Nml N/A  Left PronatorTeres Nml Nml Nml Nml Nml Nml Nml Nml N/A  Left Biceps Nml Nml Nml Nml Nml Nml Nml Nml N/A  Left Triceps Nml Nml Nml Nml Nml Nml Nml Nml N/A  Left Deltoid Nml Nml Nml Nml Nml Nml Nml Nml N/A     I have personally reviewed the electrodiagnostics and agree with the above interpretation.  Severe carpal tunnel on the right, moderately severe carpal tunnel on the left.  Assessment and Plan: Ms. Blancher is a pleasant 71 y.o. female with 2+ years of significant carpal tunnel syndrome.  She states is worse on the right than the left.  Her  left hand is her dominant hand.  On the right she shows some mild thenar atrophy when compared to the left side.  She has positive carpal compression test, positive Phalen's.  Often wakes at night to shake out her hands.  Describes it more as a tingling sensation rather than a pain.  Given these ongoing symptoms as well as lack of improvement with wrist bracing and severe level of disease as noted by the nerve conduction test we feel that she would benefit from a carpal tunnel decompression.  Will plan for either open versus carpal tunnel release with ultrasound guidance in late September as patient preference due to personal arrangements.  Thank you for involving me in the care of this patient.    Lovenia Kim MD/MSCR Neurosurgery - Peripheral Nerve Surgery

## 2023-01-23 ENCOUNTER — Encounter: Payer: Self-pay | Admitting: Neurosurgery

## 2023-01-23 ENCOUNTER — Ambulatory Visit (INDEPENDENT_AMBULATORY_CARE_PROVIDER_SITE_OTHER): Payer: Medicare Other | Admitting: Neurosurgery

## 2023-01-23 VITALS — BP 115/60 | Ht 66.0 in | Wt 257.6 lb

## 2023-01-23 DIAGNOSIS — G5603 Carpal tunnel syndrome, bilateral upper limbs: Secondary | ICD-10-CM | POA: Diagnosis not present

## 2023-01-23 NOTE — Addendum Note (Signed)
Addended by: Sharlot Gowda on: 01/23/2023 04:36 PM   Modules accepted: Orders

## 2023-02-23 ENCOUNTER — Encounter: Payer: Self-pay | Admitting: Neurosurgery

## 2023-02-27 ENCOUNTER — Encounter: Payer: Self-pay | Admitting: Podiatry

## 2023-02-27 ENCOUNTER — Ambulatory Visit: Payer: Medicare Other | Admitting: Podiatry

## 2023-02-27 DIAGNOSIS — L989 Disorder of the skin and subcutaneous tissue, unspecified: Secondary | ICD-10-CM | POA: Diagnosis not present

## 2023-02-27 DIAGNOSIS — M79674 Pain in right toe(s): Secondary | ICD-10-CM

## 2023-02-27 DIAGNOSIS — M79675 Pain in left toe(s): Secondary | ICD-10-CM

## 2023-02-27 DIAGNOSIS — B351 Tinea unguium: Secondary | ICD-10-CM | POA: Diagnosis not present

## 2023-02-27 NOTE — Progress Notes (Signed)
   Chief Complaint  Patient presents with   Callouses    "The calluses have developed.  Also, my Orthopedic doctor wanted me to see if my foot could be causing problems with my right knee."    SUBJECTIVE Patient presents to office today complaining of elongated, thickened nails that cause pain while ambulating in shoes.  Patient is unable to trim their own nails.  Patient also developed symptomatic calluses to the bilateral tips of the toes.  Past Medical History:  Diagnosis Date   Acquired trigger finger of both middle fingers    Arthritis    Breast cancer, right (HCC) 2011   Right Lumpectomy and Rad tx's.   Depression    DVT (deep venous thrombosis) (HCC)    GERD (gastroesophageal reflux disease)    Heart murmur    Heart palpitations    Hyperlipidemia    Hypertension    Hypothyroidism    Leg edema    Narcolepsy and cataplexy    Neuropathy    Obesity    Osteoporosis, post-menopausal    Pre-diabetes    RLS (restless legs syndrome)    Sleep apnea    not using cpap machine-feels like she is drowning    No Known Allergies    OBJECTIVE General Patient is awake, alert, and oriented x 3 and in no acute distress. Derm Skin is dry and supple bilateral. Negative open lesions or macerations. Remaining integument unremarkable. Nails are tender, long, thickened and dystrophic with subungual debris, consistent with onychomycosis, 1-5 bilateral. No signs of infection noted.  Hyperkeratotic preulcerative callus noted to the distal tips of the bilateral toes.  No open wounds noted Vasc  DP and PT pedal pulses palpable bilaterally. Temperature gradient within normal limits.  Neuro light touch and protective threshold sensation diminished bilaterally.  Musculoskeletal Exam history of prior bilateral forefoot surgery  ASSESSMENT 1.  Pain due to onychomycosis of toenails both 2.  Preulcerative callus lesions distal tips of the bilateral toes 3.  DJD/arthritis right knee  PLAN OF  CARE -Patient evaluated today.  -Instructed to maintain good pedal hygiene and foot care.  -Mechanical debridement of nails 1-5 bilaterally performed using a nail nipper. Filed with dremel without incident.  -Excisional debridement of the hyperkeratotic preulcerative callus tissue was performed using a tissue nipper without incident or bleeding as a courtesy to the patient -Today we did discuss the possibility of orthotics to support the feet and potentially alleviate some of her right knee pain.  Although I do believe they might be somewhat beneficial, they would not be covered by insurance.  Cost was discussed with the patient.  For now continue good supportive tennis shoes and sneakers -Return to clinic in 3 mos.    Felecia Shelling, DPM [facility]  Dr. Felecia Shelling, DPM    [address] [place], [place] [address]                Office [phone]  Fax [phone]

## 2023-04-12 ENCOUNTER — Ambulatory Visit: Payer: Medicare Other | Admitting: Neurosurgery

## 2023-04-19 ENCOUNTER — Ambulatory Visit: Payer: Medicare Other | Admitting: Neurosurgery

## 2023-04-19 ENCOUNTER — Other Ambulatory Visit: Payer: Self-pay

## 2023-04-19 ENCOUNTER — Telehealth: Payer: Self-pay

## 2023-04-19 ENCOUNTER — Encounter: Payer: Self-pay | Admitting: Neurosurgery

## 2023-04-19 VITALS — BP 126/78 | Ht 68.0 in | Wt 257.0 lb

## 2023-04-19 DIAGNOSIS — Z01818 Encounter for other preprocedural examination: Secondary | ICD-10-CM

## 2023-04-19 DIAGNOSIS — I872 Venous insufficiency (chronic) (peripheral): Secondary | ICD-10-CM

## 2023-04-19 NOTE — Telephone Encounter (Signed)
Please see below for information in regards to your upcoming surgery:   Planned surgery: Right carpal tunnel release with ultrasound guidance   Surgery date: 05/08/23 at [facility] (Medical Mall: [address], [place], [place] [address]) - you will find out your arrival time the business day before your surgery.   Pre-op appointment at [facility] Pre-admit Testing: we will call you with a date/time for this. If you are scheduled for an in person appointment, Pre-admit Testing is located on the first floor of the [facility] building, [address], [address]. Please bring all prescriptions in the original prescription bottles to your appointment. During this appointment, they will advise you which medications you can take the morning of surgery, and which medications you will need to hold for surgery. Labs (such as blood work, EKG) may be done at your pre-op appointment. You are not required to fast for these labs. Should you need to change your pre-op appointment, please call Pre-admit testing at [phone].     How to contact us:  If you have any questions/concerns before or after surgery, you can reach [place] at [phone], or you can send a mychart message. We can be reached by phone or mychart 8am-4pm, Monday-Friday.  *Please note: Calls after 4pm are forwarded to a third party answering service. Mychart messages are not routinely monitored during evenings, weekends, and holidays. Please call our office to contact the answering service for urgent concerns during non-business hours.    If you have FMLA/disability paperwork, please drop it off or fax it to [phone], attention Patty.   Appointments/FMLA & disability paperwork: Joycelyn Rua, & Flonnie Hailstone Registered Nurse/Surgery scheduler: Royston Cowper Medical Assistants: Nash Mantis Physician Assistants: Manning Charity, PA-C & Drake Leach, PA-C Surgeons: Venetia Night, MD & Ernestine Mcmurray,  MD

## 2023-04-19 NOTE — Progress Notes (Signed)
   REFERRING PHYSICIAN:  No referring provider defined for this encounter.  DOS: 10/23/22  Right L4-5 lumbar decompression including central laminectomy and bilateral medial facetectomies including foraminotomies   HISTORY OF PRESENT ILLNESS: Karen [name] is having trouble with leg pain.  It bothers her almost all the time.  She has some numbness and tingling.  It is all around her legs.  This has been very bothersome.  She is still having symptoms of carpal tunnel syndrome.    PHYSICAL EXAMINATION:  General: Patient is well developed, well nourished, calm, collected, and in no apparent distress.   NEUROLOGICAL:  General: In no acute distress.   Awake, alert, oriented to person, place, and time.  Pupils equal round and reactive to light.  Facial tone is symmetric.     Strength:            Side Iliopsoas Quads Hamstring PF DF EHL  R 5 5 5 5 5 5   L 5 5 5 5 5 5    She has some discoloration of her legs right greater than left concerning for possible venous insufficiency   ROS (Neurologic):  Negative except as noted above  IMAGING: Nothing new to review.   ASSESSMENT/PLAN:  Karen [name] is having some symptoms which could be secondary to venous insufficiency or her peripheral neuropathy.  I will send her for vascular workup.  If there is no intervention that can be performed, we discussed that she could be a candidate for spinal cord stimulation.  I have encouraged her to consider moving forward with carpal tunnel release, as it will not impact her timing for other interventions.    Venetia Night Department of neurosurgery

## 2023-05-01 ENCOUNTER — Other Ambulatory Visit: Payer: Self-pay

## 2023-05-01 ENCOUNTER — Encounter
Admission: RE | Admit: 2023-05-01 | Discharge: 2023-05-01 | Disposition: A | Discharge status: Home / Self Care | Payer: Medicare Other | Source: Ambulatory Visit | Attending: Neurosurgery | Admitting: Neurosurgery

## 2023-05-01 NOTE — Patient Instructions (Addendum)
Your procedure is scheduled on: Tuesday 05/08/23 To find out your arrival time, please call [phone] between 1PM - 3PM on:   Monday 05/07/23 Report to the Registration Desk on the 1st floor of the [facility]. Free Valet parking is available.  If your arrival time is 6:00 am, do not arrive before that time as the [facility] entrance doors do not open until 6:00 am.  REMEMBER: Instructions that are not followed completely may result in serious medical risk, up to and including death; or upon the discretion of your surgeon and anesthesiologist your surgery may need to be rescheduled.  Do not eat food after midnight the night before surgery.  No gum chewing or hard candies.  You may however, drink CLEAR liquids up to 2 hours before you are scheduled to arrive for your surgery. Do not drink anything within 2 hours of your scheduled arrival time.  Clear liquids include: - water  - apple juice without pulp - gatorade (not RED colors) - black coffee or tea (Do NOT add milk or creamers to the coffee or tea) Do NOT drink anything that is not on this list.  Type 1 and Type 2 diabetics should only drink water.  One week prior to surgery: Stop Anti-inflammatories (NSAIDS) such as Advil, Aleve, Ibuprofen, Motrin, Naproxen, Naprosyn and Aspirin based products such as Excedrin, Goody's Powder, BC Powder. You may however, continue to take Tylenol if needed for pain up until the day of surgery.  Stop ANY OVER THE COUNTER supplements or vitamins until after surgery.  Continue taking all prescribed medications.  TAKE ONLY THESE MEDICATIONS THE MORNING OF SURGERY WITH A SIP OF WATER:  colchicine 0.6 MG tablet  levothyroxine (SYNTHROID, LEVOTHROID) 75 MCG tablet  pramipexole (MIRAPEX) 1 MG tablet   No Alcohol for 24 hours before or after surgery.  No Smoking including e-cigarettes for 24 hours before surgery.  No chewable tobacco products for at least 6 hours before surgery.  No  nicotine patches on the day of surgery.  Do not use any "recreational" drugs for at least a week (preferably 2 weeks) before your surgery.  Please be advised that the combination of cocaine and anesthesia may have negative outcomes, up to and including death. If you test positive for cocaine, your surgery will be cancelled.  On the morning of surgery brush your teeth with toothpaste and water, you may rinse your mouth with mouthwash if you wish. Do not swallow any toothpaste or mouthwash.  Use CHG Soap or wipes as directed on instruction sheet. Use the soap given by surgeon's office as directed.  Do not wear lotions, powders, or perfumes.   Do not shave body hair from the neck down 48 hours before surgery.  Wear comfortable clothing (specific to your surgery type) to the hospital.  Do not wear jewelry, make-up, hairpins, clips or nail polish.  For welded (permanent) jewelry: bracelets, anklets, waist bands, etc.  Please have this removed prior to surgery.  If it is not removed, there is a chance that hospital personnel will need to cut it off on the day of surgery. Contact lenses, hearing aids and dentures may not be worn into surgery.  Do not bring valuables to the hospital. [facility] is not responsible for any missing/lost belongings or valuables.   Notify your doctor if there is any change in your medical condition (cold, fever, infection).  If you are being discharged the day of surgery, you will not be allowed to drive home.  You will need a responsible individual to drive you home and stay with you for 24 hours after surgery.   If you are taking public transportation, you will need to have a responsible individual with you.  If you are being admitted to the hospital overnight, leave your suitcase in the car. After surgery it may be brought to your room.  In case of increased patient census, it may be necessary for you, the patient, to continue your postoperative care in the  Same Day Surgery department.  After surgery, you can help prevent lung complications by doing breathing exercises.  Take deep breaths and cough every 1-2 hours. Your doctor may order a device called an Incentive Spirometer to help you take deep breaths. When coughing or sneezing, hold a pillow firmly against your incision with both hands. This is called "splinting." Doing this helps protect your incision. It also decreases belly discomfort.  Surgery Visitation Policy:  Patients undergoing a surgery or procedure may have two family members or support persons with them as long as the person is not COVID-19 positive or experiencing its symptoms.   Inpatient Visitation:    Visiting hours are 7 a.m. to 8 p.m. Up to four visitors are allowed at one time in a patient room. The visitors may rotate out with other people during the day. One designated support person (adult) may remain overnight.  Please call the Pre-admissions Testing Dept. at [phone] if you have any questions about these instructions.

## 2023-05-07 MED ORDER — CEFAZOLIN IN SODIUM CHLORIDE 2-0.9 GM/100ML-% IV SOLN
2.0000 g | Freq: Once | INTRAVENOUS | Status: DC
  Filled 2023-05-07: qty 100

## 2023-05-07 MED ORDER — ORAL CARE MOUTH RINSE: 15.0000 mL | Freq: Once | OROMUCOSAL | Status: AC

## 2023-05-07 MED ORDER — LACTATED RINGERS IV SOLN: INTRAVENOUS | Status: DC

## 2023-05-07 MED ORDER — CHLORHEXIDINE GLUCONATE 0.12 % MT SOLN
15.0000 mL | Freq: Once | OROMUCOSAL | Status: AC
  Administered 2023-05-08: 15 mL via OROMUCOSAL

## 2023-05-07 MED ORDER — CEFAZOLIN SODIUM-DEXTROSE 2-4 GM/100ML-% IV SOLN
2.0000 g | INTRAVENOUS | Status: AC
  Administered 2023-05-08: 2 g via INTRAVENOUS

## 2023-05-07 NOTE — Anesthesia Preprocedure Evaluation (Signed)
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  Echo 08/04/21:  NORMAL LEFT VENTRICULAR SYSTOLIC FUNCTION  NORMAL RIGHT VENTRICULAR SYSTOLIC FUNCTION  MILD VALVULAR REGURGITATION  NO VALVULAR STENOSIS  ESTIMATED LVEF >55% (CALCULATED: 67%)  Aortic: NORMAL GRADIENTS  Mitral: TRIVIAL -MILD MR; MAX VELOCITY 5.80m/s  Tricuspid: TRIVIAL TR (2.62m/s)  Pulmonic: TRACE PI  MILD LAE  Closest EF: >55% (Estimated)  Mitral: TRIVIAL MR  Tricuspid: TRIVIAL TR    Neuro/Psych  Headaches PSYCHIATRIC DISORDERS  Depression     Neuromuscular disease (polyneuropathy)    GI/Hepatic ,GERD  ,,  Endo/Other  Hypothyroidism  Obesity  Renal/GU negative Renal ROS     Musculoskeletal  (+) Arthritis ,    Abdominal   Peds  Hematology Breast CA   Anesthesia Other Findings   Reproductive/Obstetrics                             Anesthesia Physical Anesthesia Plan  ASA: 2  Anesthesia Plan: General   Post-op Pain Management:    Induction: Intravenous  PONV Risk Score and Plan: 3 and Propofol infusion, TIVA, Treatment may vary due to age or medical condition and Ondansetron  Airway Management Planned: Natural Airway  Additional Equipment:   Intra-op Plan:   Post-operative Plan:   Informed Consent: I have reviewed the patients History and Physical, chart, labs and discussed the procedure  including the risks, benefits and alternatives for the proposed anesthesia with the patient or authorized representative who has indicated his/her understanding and acceptance.       Plan Discussed with: CRNA  Anesthesia Plan Comments: (LMA/GETA backup discussed.  Patient consented for risks of anesthesia including but not limited to:  - adverse reactions to medications - damage to eyes, teeth, lips or other oral mucosa - nerve damage due to positioning  - sore throat or hoarseness - damage to heart, brain, nerves, lungs, other parts of body or loss of life  Informed patient about role of CRNA in peri- and intra-operative care.  Patient voiced understanding.)        Anesthesia Quick Evaluation

## 2023-05-08 ENCOUNTER — Other Ambulatory Visit: Payer: Self-pay

## 2023-05-08 ENCOUNTER — Ambulatory Visit: Payer: Medicare Other | Admitting: Anesthesiology

## 2023-05-08 ENCOUNTER — Encounter: Payer: Self-pay | Admitting: Neurosurgery

## 2023-05-08 ENCOUNTER — Encounter
Admission: RE | Disposition: A | Discharge status: Home / Self Care | Payer: Self-pay | Source: Home / Self Care | Attending: Neurosurgery

## 2023-05-08 ENCOUNTER — Ambulatory Visit
Admission: RE | Admit: 2023-05-08 | Discharge: 2023-05-08 | Disposition: A | Discharge status: Home / Self Care | Payer: Medicare Other | Attending: Neurosurgery | Admitting: Neurosurgery

## 2023-05-08 DIAGNOSIS — M1990 Unspecified osteoarthritis, unspecified site: Secondary | ICD-10-CM | POA: Insufficient documentation

## 2023-05-08 DIAGNOSIS — E039 Hypothyroidism, unspecified: Secondary | ICD-10-CM | POA: Diagnosis not present

## 2023-05-08 DIAGNOSIS — R519 Headache, unspecified: Secondary | ICD-10-CM | POA: Insufficient documentation

## 2023-05-08 DIAGNOSIS — I340 Nonrheumatic mitral (valve) insufficiency: Secondary | ICD-10-CM | POA: Diagnosis not present

## 2023-05-08 DIAGNOSIS — G47419 Narcolepsy without cataplexy: Secondary | ICD-10-CM | POA: Insufficient documentation

## 2023-05-08 DIAGNOSIS — I10 Essential (primary) hypertension: Secondary | ICD-10-CM | POA: Diagnosis not present

## 2023-05-08 DIAGNOSIS — F32A Depression, unspecified: Secondary | ICD-10-CM | POA: Insufficient documentation

## 2023-05-08 DIAGNOSIS — E669 Obesity, unspecified: Secondary | ICD-10-CM | POA: Diagnosis not present

## 2023-05-08 DIAGNOSIS — K219 Gastro-esophageal reflux disease without esophagitis: Secondary | ICD-10-CM | POA: Diagnosis not present

## 2023-05-08 DIAGNOSIS — G4730 Sleep apnea, unspecified: Secondary | ICD-10-CM | POA: Diagnosis not present

## 2023-05-08 DIAGNOSIS — E785 Hyperlipidemia, unspecified: Secondary | ICD-10-CM | POA: Insufficient documentation

## 2023-05-08 DIAGNOSIS — Z86718 Personal history of other venous thrombosis and embolism: Secondary | ICD-10-CM | POA: Diagnosis not present

## 2023-05-08 DIAGNOSIS — Z6837 Body mass index (BMI) 37.0-37.9, adult: Secondary | ICD-10-CM | POA: Insufficient documentation

## 2023-05-08 DIAGNOSIS — G5603 Carpal tunnel syndrome, bilateral upper limbs: Secondary | ICD-10-CM | POA: Insufficient documentation

## 2023-05-08 DIAGNOSIS — Z01818 Encounter for other preprocedural examination: Secondary | ICD-10-CM

## 2023-05-08 DIAGNOSIS — G5601 Carpal tunnel syndrome, right upper limb: Secondary | ICD-10-CM | POA: Diagnosis present

## 2023-05-08 HISTORY — PX: CARPAL TUNNEL RELEASE: SHX101

## 2023-05-08 SURGERY — CARPAL TUNNEL RELEASE
Anesthesia: General | Site: Wrist | Laterality: Right

## 2023-05-08 MED ORDER — OXYCODONE HCL 5 MG PO TABS
ORAL_TABLET | ORAL | Status: AC
  Filled 2023-05-08: qty 1

## 2023-05-08 MED ORDER — HYDROCODONE-ACETAMINOPHEN 5-325 MG PO TABS: 1.0000 | ORAL_TABLET | ORAL | 0 refills | Status: AC | PRN

## 2023-05-08 MED ORDER — 0.9 % SODIUM CHLORIDE (POUR BTL) OPTIME
TOPICAL | Status: DC | PRN
  Administered 2023-05-08: 500 mL

## 2023-05-08 MED ORDER — LIDOCAINE HCL (PF) 2 % IJ SOLN
INTRAMUSCULAR | Status: AC
  Filled 2023-05-08: qty 5

## 2023-05-08 MED ORDER — OXYCODONE HCL 5 MG/5ML PO SOLN: 5.0000 mg | Freq: Once | ORAL | Status: AC | PRN

## 2023-05-08 MED ORDER — DEXMEDETOMIDINE HCL IN NACL 80 MCG/20ML IV SOLN
INTRAVENOUS | Status: AC
  Filled 2023-05-08: qty 20

## 2023-05-08 MED ORDER — LACTATED RINGERS IV SOLN
INTRAVENOUS | Status: AC
Start: 2023-05-08 — End: 2023-05-08

## 2023-05-08 MED ORDER — FENTANYL CITRATE (PF) 100 MCG/2ML IJ SOLN
INTRAMUSCULAR | Status: AC
  Filled 2023-05-08: qty 2

## 2023-05-08 MED ORDER — CHLORHEXIDINE GLUCONATE 0.12 % MT SOLN
OROMUCOSAL | Status: AC
Start: 2023-05-08 — End: ?
  Filled 2023-05-08: qty 15

## 2023-05-08 MED ORDER — ACETAMINOPHEN 10 MG/ML IV SOLN: 1000.0000 mg | Freq: Once | INTRAVENOUS | Status: DC | PRN

## 2023-05-08 MED ORDER — ACETAMINOPHEN 10 MG/ML IV SOLN
INTRAVENOUS | Status: AC
  Filled 2023-05-08: qty 100

## 2023-05-08 MED ORDER — FENTANYL CITRATE (PF) 100 MCG/2ML IJ SOLN: 25.0000 ug | INTRAMUSCULAR | Status: DC | PRN

## 2023-05-08 MED ORDER — FENTANYL CITRATE (PF) 100 MCG/2ML IJ SOLN
INTRAMUSCULAR | Status: DC | PRN
  Administered 2023-05-08: 25 ug via INTRAVENOUS
  Administered 2023-05-08: 50 ug via INTRAVENOUS

## 2023-05-08 MED ORDER — ONDANSETRON HCL 4 MG/2ML IJ SOLN: 4.0000 mg | Freq: Once | INTRAMUSCULAR | Status: DC | PRN

## 2023-05-08 MED ORDER — PHENYLEPHRINE 80 MCG/ML (10ML) SYRINGE FOR IV PUSH (FOR BLOOD PRESSURE SUPPORT)
PREFILLED_SYRINGE | INTRAVENOUS | Status: DC | PRN
  Administered 2023-05-08 (×2): 160 ug via INTRAVENOUS

## 2023-05-08 MED ORDER — DOCUSATE SODIUM 100 MG PO CAPS: 100.0000 mg | ORAL_CAPSULE | Freq: Every day | ORAL | 0 refills | Status: DC | PRN

## 2023-05-08 MED ORDER — PROPOFOL 10 MG/ML IV BOLUS
INTRAVENOUS | Status: DC | PRN
  Administered 2023-05-08: 30 mg via INTRAVENOUS

## 2023-05-08 MED ORDER — CEFAZOLIN SODIUM-DEXTROSE 2-4 GM/100ML-% IV SOLN
INTRAVENOUS | Status: AC
  Filled 2023-05-08: qty 100

## 2023-05-08 MED ORDER — SURGIFLO WITH THROMBIN (HEMOSTATIC MATRIX KIT) OPTIME
TOPICAL | Status: DC | PRN
  Administered 2023-05-08: 1 via TOPICAL

## 2023-05-08 MED ORDER — LIDOCAINE HCL (CARDIAC) PF 100 MG/5ML IV SOSY
PREFILLED_SYRINGE | INTRAVENOUS | Status: DC | PRN
  Administered 2023-05-08: 50 mg via INTRAVENOUS

## 2023-05-08 MED ORDER — ONDANSETRON HCL 4 MG/2ML IJ SOLN
INTRAMUSCULAR | Status: DC | PRN
  Administered 2023-05-08: 4 mg via INTRAVENOUS

## 2023-05-08 MED ORDER — LIDOCAINE HCL 1 % IJ SOLN
INTRAMUSCULAR | Status: DC | PRN
  Administered 2023-05-08: 3 mL

## 2023-05-08 MED ORDER — PROPOFOL 1000 MG/100ML IV EMUL
INTRAVENOUS | Status: AC
  Filled 2023-05-08: qty 100

## 2023-05-08 MED ORDER — PROPOFOL 500 MG/50ML IV EMUL
INTRAVENOUS | Status: DC | PRN
  Administered 2023-05-08: 125 ug/kg/min via INTRAVENOUS

## 2023-05-08 MED ORDER — LIDOCAINE HCL (PF) 1 % IJ SOLN
INTRAMUSCULAR | Status: AC
  Filled 2023-05-08: qty 30

## 2023-05-08 MED ORDER — OXYCODONE HCL 5 MG PO TABS
5.0000 mg | ORAL_TABLET | Freq: Once | ORAL | Status: AC | PRN
  Administered 2023-05-08: 5 mg via ORAL

## 2023-05-08 MED ORDER — ACETAMINOPHEN 10 MG/ML IV SOLN
INTRAVENOUS | Status: DC | PRN
  Administered 2023-05-08: 1000 mg via INTRAVENOUS

## 2023-05-08 SURGICAL SUPPLY — 33 items
BNDG ADH 1X3 SHEER STRL LF (GAUZE/BANDAGES/DRESSINGS) ×1 IMPLANT
BNDG ADH 2 X3.75 FABRIC TAN LF (GAUZE/BANDAGES/DRESSINGS) IMPLANT
BNDG COHESIVE 4X5 TAN STRL LF (GAUZE/BANDAGES/DRESSINGS) IMPLANT
BNDG ELASTIC 2INX 5YD STR LF (GAUZE/BANDAGES/DRESSINGS) ×1 IMPLANT
BNDG ELASTIC 3INX 5YD STR LF (GAUZE/BANDAGES/DRESSINGS) ×1 IMPLANT
BRUSH SCRUB EZ 4% CHG (MISCELLANEOUS) ×1 IMPLANT
CHLORAPREP W/TINT 26 (MISCELLANEOUS) ×2 IMPLANT
CORD BIP STRL DISP 12FT (MISCELLANEOUS) IMPLANT
DERMABOND ADVANCED .7 DNX12 (GAUZE/BANDAGES/DRESSINGS) IMPLANT
FORCEPS JEWEL BIP 4-3/4 STR (INSTRUMENTS) IMPLANT
GAUZE SPONGE 4X4 12PLY STRL (GAUZE/BANDAGES/DRESSINGS) ×2 IMPLANT
GAUZE XEROFORM 1X8 LF (GAUZE/BANDAGES/DRESSINGS) ×1 IMPLANT
GLOVE SRG 8 PF TXTR STRL LF DI (GLOVE) ×1 IMPLANT
GLOVE SURG SYN 7.5 E (GLOVE) ×1 IMPLANT
GLOVE SURG SYN 7.5 PF PI (GLOVE) ×1 IMPLANT
GLOVE SURG UNDER POLY LF SZ8 (GLOVE) ×1
GOWN SRG XL LVL 3 NONREINFORCE (GOWNS) ×1 IMPLANT
GOWN STRL NON-REIN TWL XL LVL3 (GOWNS) ×1
KIT TURNOVER KIT A (KITS) ×1 IMPLANT
MANIFOLD NEPTUNE II (INSTRUMENTS) ×1 IMPLANT
NS IRRIG 500ML POUR BTL (IV SOLUTION) ×1 IMPLANT
PACK EXTREMITY ARMC (MISCELLANEOUS) ×1 IMPLANT
PAD ARMBOARD 7.5X6 YLW CONV (MISCELLANEOUS) ×1 IMPLANT
PAD TELFA 2X3 NADH STRL (GAUZE/BANDAGES/DRESSINGS) ×1 IMPLANT
STOCKINETTE IMPERVIOUS 9X36 MD (GAUZE/BANDAGES/DRESSINGS) ×1 IMPLANT
SURGIFLO W/THROMBIN 8M KIT (HEMOSTASIS) IMPLANT
SUT ETHILON 3-0 (SUTURE) ×1 IMPLANT
SUT VIC AB 3-0 PS2 18 (SUTURE)
SUT VIC AB 3-0 PS2 18XBRD (SUTURE) ×1 IMPLANT
SYR 20ML LL LF (SYRINGE) ×1 IMPLANT
TOWEL OR 17X26 4PK STRL BLUE (TOWEL DISPOSABLE) ×1 IMPLANT
ULTRAGLIDE CTR (BLADE) IMPLANT
WATER STERILE IRR 500ML POUR (IV SOLUTION) ×1 IMPLANT

## 2023-05-08 NOTE — H&P (View-Only) (Signed)
Referring Physician:  No referring provider defined for this encounter.  Primary Physician:  [facility]  History of Present Illness: 05/08/2023 Karen [name] is here today with a chief complaint of Carpal Tunnel Syndrome.  She has been dealing with this for at least 2 years.  Only feels it bilaterally but the right is worse than the left.  This often wakes her from sleep.  Even during the day she finds herself intermittently trying to shake out her hands.  Starts as tingling but can also produce burning.  This mostly present in the median distribution, she does on the right side get some radiation up into the forearm.  She has tried nighttime bracing.  She has not had injections.  She has a recent EMG which demonstrates severe level symptoms.  She does feel that she has decreased sensation.  She has not previously had carpal tunnel surgery.  Review of Systems:  A 10 point review of systems is negative, except for the pertinent positives and negatives detailed in the HPI.  Past Medical History: Past Medical History:  Diagnosis Date   Acquired trigger finger of both middle fingers    Arthritis    Breast cancer, right (HCC) 2011   Right Lumpectomy and Rad tx's.   Depression    DVT (deep venous thrombosis) (HCC)    leg   GERD (gastroesophageal reflux disease)    Heart murmur    Heart palpitations    Hyperlipidemia    Hypertension    Hypothyroidism    Leg edema    Narcolepsy and cataplexy    Neuropathy    Obesity    Osteoporosis, post-menopausal    Pre-diabetes    RLS (restless legs syndrome)    Sleep apnea    not using cpap machine-feels like she is drowning    Past Surgical History: Past Surgical History:  Procedure Laterality Date   ABDOMINAL HYSTERECTOMY     APPENDECTOMY     BREAST SURGERY     CARDIAC CATHETERIZATION N/A 11/25/2014   Procedure: Left Heart Cath;  Surgeon: Dalia Heading, MD;  Location: [facility] INVASIVE CV LAB;  Service:  Cardiovascular;  Laterality: N/A;   CHOLECYSTECTOMY     COLONOSCOPY WITH PROPOFOL N/A 08/05/2018   Procedure: COLONOSCOPY WITH PROPOFOL;  Surgeon: Christena Deem, MD;  Location: [facility] ENDOSCOPY;  Service: Endoscopy;  Laterality: N/A;   FORAMINOTOMY 1 LEVEL Right 10/23/2022   Procedure: RIGHT L4-5 LAMINOFORAMINOTOMY;  Surgeon: Venetia Night, MD;  Location: [facility] ORS;  Service: Neurosurgery;  Laterality: Right;   MASTECTOMY Right    partial/lumpectomy    Allergies: Allergies as of 04/19/2023   (No Known Allergies)    Medications:  Current Facility-Administered Medications:    ceFAZolin (ANCEF) IVPB 2g/100 mL premix, 2 g, Intravenous, 60 min Pre-Op, Katrinka Blazing, Jana Half, MD   lactated ringers infusion, , Intravenous, Continuous, Corinda Gubler, MD  Social History: Social History   Tobacco Use   Smoking status: Never   Smokeless tobacco: Never  Vaping Use   Vaping status: Never Used  Substance Use Topics   Alcohol use: Not Currently    Alcohol/week: 5.0 standard drinks of alcohol    Types: 5 Shots of liquor per week   Drug use: No    Family Medical History: Family History  Problem Relation Age of Onset   Cancer Mother    Hypertension Mother    Hypertension Father    Heart disease Father    Emphysema Father  Depression Father    Cancer Sister    Hypertension Sister     Physical Examination: Vitals:   05/08/23 0625  BP: 130/85  Pulse: 90  Resp: 18  Temp: 97.7 F (36.5 C)  SpO2: 97%    General: Patient is in no apparent distress. Attention to examination is appropriate.  Neck:   Supple.  Full range of motion.  Respiratory: Patient is breathing without any difficulty.   NEUROLOGICAL:     Awake, alert, oriented to person, place, and time.  Speech is clear and fluent.   Cranial Nerves: Pupils equal round and reactive to light.  Facial tone is symmetric. Shoulder shrug is symmetric. Tongue protrusion is midline.  There is no pronator drift.  Motor Exam:   She demonstrates some mild thenar atrophy when compared to the contralateral side, she does have a positive Phalen's bilaterally.  Positive carpal compression test bilaterally.  Reflexes are 2+ and symmetric at the biceps, triceps, brachioradialis.  Hoffman's is absent. Clonus is Absent  Decreased sensation in the bilateral median distributions right worse than left  Gait is normal.     Medical Decision Making   Electrodiagnostics:  [facility]  [address] [place], [address]  [place], [place] [address] Tel: [phone] Fax: [phone] Test Date:  01/12/2023   Patient: [name] DOB: 03-Aug-1950 Physician: Nita Sickle, DO  Sex: Female Height: 5\' 8"  Ref Phys: Venetia Night, MD  ID#: [id number]     Technician:      History: This is a 72 year old female referred for evaluation of bilateral hand paresthesias.   NCV & EMG Findings: Extensive electrodiagnostic testing of the right upper extremity and additional studies of the left shows:  Right median sensory response is absent.  Left median sensory response shows prolonged latency (5.3 ms) and reduced amplitude (7.3 V).  Bilateral ulnar sensory responses are within normal limits. Bilateral median motor responses show prolonged latency (R5.2, L5.5 ms).  Of note there is evidence of bilateral Martin-Gruber anastomosis, a normal anatomic variant.  Bilateral ulnar motor responses are within normal limits.  Chronic motor axon loss changes are isolated to the right abductor pollicis brevis muscle, without accompanying active denervation.  These findings are not present in the left upper extremity.   Impression: Right median neuropathy at or distal to the wrist (severe), consistent with a clinical diagnosis of carpal tunnel syndrome. Left median neuropathy at or distal to the wrist (moderate-to-severe), consistent with a clinical diagnosis of carpal tunnel syndrome. Incidentally, there is evidence of bilateral  Martin-Gruber anastomosis, a normal anatomic variant.     ___________________________ Nita Sickle, DO     Nerve Conduction Studies    Stim Site NR Peak (ms) Norm Peak (ms) O-P Amp (V) Norm O-P Amp  Left Median Anti Sensory (2nd Digit)  32 C  Wrist    *5.3 <3.8 *7.3 >10  Right Median Anti Sensory (2nd Digit)  32 C  Wrist *NR   <3.8   >10  Left Ulnar Anti Sensory (5th Digit)  32 C  Wrist    2.8 <3.2 38.9 >5  Right Ulnar Anti Sensory (5th Digit)  32 C  Wrist    2.7 <3.2 37.1 >5       Stim Site NR Onset (ms) Norm Onset (ms) O-P Amp (mV) Norm O-P Amp Site1 Site2 Delta-0 (ms) Dist (cm) Vel (m/s) Norm Vel (m/s)  Left Median Motor (Abd Poll Brev)  32 C  Wrist    *5.5 <4.0 5.5 >5 Elbow Wrist  5.2 29.0 56 >50  Elbow    10.7   5.3   Ullnar-wrist crossover Elbow 7.3 0.0      Ullnar-wrist crossover    3.4   2.3                Right Median Motor (Abd Poll Brev)  32 C  Wrist    *5.2 <4.0 5.7 >5 Elbow Wrist 5.5 29.0 53 >50  Elbow    10.7   5.1   Ulnar-wrist crossover Elbow 5.9 0.0      Ulnar-wrist crossover    4.8   2.6                Left Ulnar Motor (Abd Dig Minimi)  32 C  Wrist    2.0 <3.1 7.6 >7 B Elbow Wrist 3.7 21.0 57 >50  B Elbow    5.7   7.1   A Elbow B Elbow 1.7 10.0 59 >50  A Elbow    7.4   7.1                Right Ulnar Motor (Abd Dig Minimi)  32 C  Wrist    2.7 <3.1 8.1 >7 B Elbow Wrist 3.6 22.0 61 >50  B Elbow    6.3   7.9   A Elbow B Elbow 1.6 10.0 62 >50  A Elbow    7.9   7.9                  Electromyography    Side Muscle Ins.Act Fibs Fasc Recrt Amp Dur Poly Activation Comment  Right 1stDorInt Nml Nml Nml Nml Nml Nml Nml Nml N/A  Right Abd Poll Brev Nml Nml Nml *1- *1+ *1+ *1+ Nml N/A  Right PronatorTeres Nml Nml Nml Nml Nml Nml Nml Nml N/A  Right Biceps Nml Nml Nml Nml Nml Nml Nml Nml N/A  Right Triceps Nml Nml Nml Nml Nml Nml Nml Nml N/A  Right Deltoid Nml Nml Nml Nml Nml Nml Nml Nml N/A  Left 1stDorInt Nml Nml Nml Nml Nml Nml Nml Nml N/A  Left Abd Poll  Brev Nml Nml Nml Nml Nml Nml Nml Nml N/A  Left PronatorTeres Nml Nml Nml Nml Nml Nml Nml Nml N/A  Left Biceps Nml Nml Nml Nml Nml Nml Nml Nml N/A  Left Triceps Nml Nml Nml Nml Nml Nml Nml Nml N/A  Left Deltoid Nml Nml Nml Nml Nml Nml Nml Nml N/A     I have personally reviewed the electrodiagnostics and agree with the above interpretation.  Severe carpal tunnel on the right, moderately severe carpal tunnel on the left.  Assessment and Plan: Ms. Makos is a pleasant 72 y.o. female with 2+ years of significant carpal tunnel syndrome.  She states is worse on the right than the left.  Her left hand is her dominant hand.  On the right she shows some mild thenar atrophy when compared to the left side.  She has positive carpal compression test, positive Phalen's.  Often wakes at night to shake out her hands.  Describes it more as a tingling sensation rather than a pain.  Given these ongoing symptoms as well as lack of improvement with wrist bracing and severe level of disease as noted by the nerve conduction test we feel that she would benefit from a carpal tunnel decompression.  Will plan for right carpal tunnel release with US guidance.   Thank you for involving me in the care of this  patient.    Lovenia Kim MD/MSCR Neurosurgery - Peripheral Nerve Surgery

## 2023-05-08 NOTE — Progress Notes (Signed)
Referring Physician:  No referring provider defined for this encounter.  Primary Physician:  [facility]  History of Present Illness: 05/08/2023 Karen [name] is here today with a chief complaint of Carpal Tunnel Syndrome.  She has been dealing with this for at least 2 years.  Only feels it bilaterally but the right is worse than the left.  This often wakes her from sleep.  Even during the day she finds herself intermittently trying to shake out her hands.  Starts as tingling but can also produce burning.  This mostly present in the median distribution, she does on the right side get some radiation up into the forearm.  She has tried nighttime bracing.  She has not had injections.  She has a recent EMG which demonstrates severe level symptoms.  She does feel that she has decreased sensation.  She has not previously had carpal tunnel surgery.  Review of Systems:  A 10 point review of systems is negative, except for the pertinent positives and negatives detailed in the HPI.  Past Medical History: Past Medical History:  Diagnosis Date   Acquired trigger finger of both middle fingers    Arthritis    Breast cancer, right (HCC) 2011   Right Lumpectomy and Rad tx's.   Depression    DVT (deep venous thrombosis) (HCC)    leg   GERD (gastroesophageal reflux disease)    Heart murmur    Heart palpitations    Hyperlipidemia    Hypertension    Hypothyroidism    Leg edema    Narcolepsy and cataplexy    Neuropathy    Obesity    Osteoporosis, post-menopausal    Pre-diabetes    RLS (restless legs syndrome)    Sleep apnea    not using cpap machine-feels like she is drowning    Past Surgical History: Past Surgical History:  Procedure Laterality Date   ABDOMINAL HYSTERECTOMY     APPENDECTOMY     BREAST SURGERY     CARDIAC CATHETERIZATION N/A 11/25/2014   Procedure: Left Heart Cath;  Surgeon: Dalia Heading, MD;  Location: [facility] INVASIVE CV LAB;  Service:  Cardiovascular;  Laterality: N/A;   CHOLECYSTECTOMY     COLONOSCOPY WITH PROPOFOL N/A 08/05/2018   Procedure: COLONOSCOPY WITH PROPOFOL;  Surgeon: Christena Deem, MD;  Location: [facility] ENDOSCOPY;  Service: Endoscopy;  Laterality: N/A;   FORAMINOTOMY 1 LEVEL Right 10/23/2022   Procedure: RIGHT L4-5 LAMINOFORAMINOTOMY;  Surgeon: Venetia Night, MD;  Location: [facility] ORS;  Service: Neurosurgery;  Laterality: Right;   MASTECTOMY Right    partial/lumpectomy    Allergies: Allergies as of 04/19/2023   (No Known Allergies)    Medications:  Current Facility-Administered Medications:    ceFAZolin (ANCEF) IVPB 2g/100 mL premix, 2 g, Intravenous, 60 min Pre-Op, Katrinka Blazing, Jana Half, MD   lactated ringers infusion, , Intravenous, Continuous, Corinda Gubler, MD  Social History: Social History   Tobacco Use   Smoking status: Never   Smokeless tobacco: Never  Vaping Use   Vaping status: Never Used  Substance Use Topics   Alcohol use: Not Currently    Alcohol/week: 5.0 standard drinks of alcohol    Types: 5 Shots of liquor per week   Drug use: No    Family Medical History: Family History  Problem Relation Age of Onset   Cancer Mother    Hypertension Mother    Hypertension Father    Heart disease Father    Emphysema Father  Depression Father    Cancer Sister    Hypertension Sister     Physical Examination: Vitals:   05/08/23 0625  BP: 130/85  Pulse: 90  Resp: 18  Temp: 97.7 F (36.5 C)  SpO2: 97%    General: Patient is in no apparent distress. Attention to examination is appropriate.  Neck:   Supple.  Full range of motion.  Respiratory: Patient is breathing without any difficulty.   NEUROLOGICAL:     Awake, alert, oriented to person, place, and time.  Speech is clear and fluent.   Cranial Nerves: Pupils equal round and reactive to light.  Facial tone is symmetric. Shoulder shrug is symmetric. Tongue protrusion is midline.  There is no pronator drift.  Motor Exam:   She demonstrates some mild thenar atrophy when compared to the contralateral side, she does have a positive Phalen's bilaterally.  Positive carpal compression test bilaterally.  Reflexes are 2+ and symmetric at the biceps, triceps, brachioradialis.  Hoffman's is absent. Clonus is Absent  Decreased sensation in the bilateral median distributions right worse than left  Gait is normal.     Medical Decision Making   Electrodiagnostics:  [facility]  [address] [place], [address]  [place], [place] [address] Tel: [phone] Fax: [phone] Test Date:  01/12/2023   Patient: [name] DOB: 03-Aug-1950 Physician: Nita Sickle, DO  Sex: Female Height: 5\' 8"  Ref Phys: Venetia Night, MD  ID#: [id number]     Technician:      History: This is a 72 year old female referred for evaluation of bilateral hand paresthesias.   NCV & EMG Findings: Extensive electrodiagnostic testing of the right upper extremity and additional studies of the left shows:  Right median sensory response is absent.  Left median sensory response shows prolonged latency (5.3 ms) and reduced amplitude (7.3 V).  Bilateral ulnar sensory responses are within normal limits. Bilateral median motor responses show prolonged latency (R5.2, L5.5 ms).  Of note there is evidence of bilateral Martin-Gruber anastomosis, a normal anatomic variant.  Bilateral ulnar motor responses are within normal limits.  Chronic motor axon loss changes are isolated to the right abductor pollicis brevis muscle, without accompanying active denervation.  These findings are not present in the left upper extremity.   Impression: Right median neuropathy at or distal to the wrist (severe), consistent with a clinical diagnosis of carpal tunnel syndrome. Left median neuropathy at or distal to the wrist (moderate-to-severe), consistent with a clinical diagnosis of carpal tunnel syndrome. Incidentally, there is evidence of bilateral  Martin-Gruber anastomosis, a normal anatomic variant.     ___________________________ Nita Sickle, DO     Nerve Conduction Studies    Stim Site NR Peak (ms) Norm Peak (ms) O-P Amp (V) Norm O-P Amp  Left Median Anti Sensory (2nd Digit)  32 C  Wrist    *5.3 <3.8 *7.3 >10  Right Median Anti Sensory (2nd Digit)  32 C  Wrist *NR   <3.8   >10  Left Ulnar Anti Sensory (5th Digit)  32 C  Wrist    2.8 <3.2 38.9 >5  Right Ulnar Anti Sensory (5th Digit)  32 C  Wrist    2.7 <3.2 37.1 >5       Stim Site NR Onset (ms) Norm Onset (ms) O-P Amp (mV) Norm O-P Amp Site1 Site2 Delta-0 (ms) Dist (cm) Vel (m/s) Norm Vel (m/s)  Left Median Motor (Abd Poll Brev)  32 C  Wrist    *5.5 <4.0 5.5 >5 Elbow Wrist  5.2 29.0 56 >50  Elbow    10.7   5.3   Ullnar-wrist crossover Elbow 7.3 0.0      Ullnar-wrist crossover    3.4   2.3                Right Median Motor (Abd Poll Brev)  32 C  Wrist    *5.2 <4.0 5.7 >5 Elbow Wrist 5.5 29.0 53 >50  Elbow    10.7   5.1   Ulnar-wrist crossover Elbow 5.9 0.0      Ulnar-wrist crossover    4.8   2.6                Left Ulnar Motor (Abd Dig Minimi)  32 C  Wrist    2.0 <3.1 7.6 >7 B Elbow Wrist 3.7 21.0 57 >50  B Elbow    5.7   7.1   A Elbow B Elbow 1.7 10.0 59 >50  A Elbow    7.4   7.1                Right Ulnar Motor (Abd Dig Minimi)  32 C  Wrist    2.7 <3.1 8.1 >7 B Elbow Wrist 3.6 22.0 61 >50  B Elbow    6.3   7.9   A Elbow B Elbow 1.6 10.0 62 >50  A Elbow    7.9   7.9                  Electromyography    Side Muscle Ins.Act Fibs Fasc Recrt Amp Dur Poly Activation Comment  Right 1stDorInt Nml Nml Nml Nml Nml Nml Nml Nml N/A  Right Abd Poll Brev Nml Nml Nml *1- *1+ *1+ *1+ Nml N/A  Right PronatorTeres Nml Nml Nml Nml Nml Nml Nml Nml N/A  Right Biceps Nml Nml Nml Nml Nml Nml Nml Nml N/A  Right Triceps Nml Nml Nml Nml Nml Nml Nml Nml N/A  Right Deltoid Nml Nml Nml Nml Nml Nml Nml Nml N/A  Left 1stDorInt Nml Nml Nml Nml Nml Nml Nml Nml N/A  Left Abd Poll  Brev Nml Nml Nml Nml Nml Nml Nml Nml N/A  Left PronatorTeres Nml Nml Nml Nml Nml Nml Nml Nml N/A  Left Biceps Nml Nml Nml Nml Nml Nml Nml Nml N/A  Left Triceps Nml Nml Nml Nml Nml Nml Nml Nml N/A  Left Deltoid Nml Nml Nml Nml Nml Nml Nml Nml N/A     I have personally reviewed the electrodiagnostics and agree with the above interpretation.  Severe carpal tunnel on the right, moderately severe carpal tunnel on the left.  Assessment and Plan: Ms. Makos is a pleasant 72 y.o. female with 2+ years of significant carpal tunnel syndrome.  She states is worse on the right than the left.  Her left hand is her dominant hand.  On the right she shows some mild thenar atrophy when compared to the left side.  She has positive carpal compression test, positive Phalen's.  Often wakes at night to shake out her hands.  Describes it more as a tingling sensation rather than a pain.  Given these ongoing symptoms as well as lack of improvement with wrist bracing and severe level of disease as noted by the nerve conduction test we feel that she would benefit from a carpal tunnel decompression.  Will plan for right carpal tunnel release with US guidance.   Thank you for involving me in the care of this  patient.    Lovenia Kim MD/MSCR Neurosurgery - Peripheral Nerve Surgery

## 2023-05-08 NOTE — Interval H&P Note (Signed)
History and Physical Interval Note:  05/08/2023 7:10 AM  Karen [name]  has presented today for surgery, with the diagnosis of G56.01 right carpal tunnel syndrome.  The various methods of treatment have been discussed with the patient and family. After consideration of risks, benefits and other options for treatment, the patient has consented to  Procedure(s): RIGHT CARPAL TUNNEL RELEASE WITH ULTRASOUND GUIDANCE (Right) as a surgical intervention.  The patient's history has been reviewed, patient examined, no change in status, stable for surgery.  I have reviewed the patient's chart and labs.  Questions were answered to the patient's satisfaction.    Heart and Lungs are clear.   Lovenia Kim

## 2023-05-08 NOTE — Op Note (Signed)
Indications: Patient with a history of median neuropathy at the wrist with hand weakness refractory to conservative management.  Findings: Severe compression of the median nerve at the transverse carpal ligament on the right  Preoperative Diagnosis: Hospital Problem List as of 05/08/2023          Priority Resolved POA     Unprioritized     * (Principal) Bilateral carpal tunnel syndrome   Yes     Postoperative Diagnosis: same   EBL: Minimal IVF: See anesthesia report Drains: none Disposition:Stable to PACU Complications: none  No foley catheter was placed.   Preoperative Note: patient with a history of progressive right median neuropathy with hand weakness refractory to conservative management.  They had tried rest, padding, and watchful waiting but had continued progressive symptoms.  Given the progression of her median neuropathy plan was made for median nerve decompression  Risk of surgery is discussed and include: Infection, bleeding, wound healing issues, pillar pain nerve injury, pain, failure to relieve the symptoms, need for further surgery.  Procedure:  1) right sided carpal tunnel decompression with ultrasound guidance   Procedure: After obtaining informed consent, the patient taken to the operating room, placed in supine position, monitored anesthesia care was induced.  They were given preoperative antibiotics.  Prepped and draped in the usual fashion.  Comprehensive timeout was performed verifying the patient's name, MRN, planned procedure.  An ultrasound was used with a sterile probe.  We used this to mark out our safety points including the interval between the ulnar artery and median nerve.  We identified the motor branch as well as the first sensory branch which were both in safe position.  We also identified the vascular arcade which was in safe positioning as well.  At this point we placed a block with lidocaine without epinephrine.  We blocked the skin where the  incision would be, the superficial sensory median nerve, as well as the transverse carpal ligament.  Under ultrasound guidance we utilized the local anesthetic to perform a hydrodissection of the nerve from the transverse carpal ligament.  We then prepped the sonex ultra CTR knife on the back table while the anesthetic set in.  We performed a small linear incision approximately 2 to 3 mm.  We then utilized a Statistician under ultrasound guidance to identify the underside of the transverse carpal ligament.  The fat and connective tissue was dissected off the underside.  We could feel that this was quite thickened and calcified.  Causing severe compression.  Once we had a clear tract we then placed the ultrasound-guided knife into the incision and advanced it through the carpal tunnel.  We verified the safety zones including the median nerve which did not significantly cross over the knife.  We are able to see the first sensory branch which was not crossing the knife.  The artery was also in a safe place.  At this point we divided the transverse carpal ligament under ultrasound guidance, to get a full release it took 2 passes.  The nerve relaxed laterally and was well decompressed.  After the 2 passes we placed a [place] 4 into the wound and were able to feel a complete dissection of the transverse carpal ligament.  We then irrigated, we got meticulous hemostasis.  Skin glue was placed on the incision and a Band-Aid was placed on top once this was dried.   No immediate complications.  Sponge and pattie counts were correct at the end of the procedure.  I performed the procedure without an assistant surgeon  Lovenia Kim, MD/MSCR

## 2023-05-08 NOTE — Anesthesia Postprocedure Evaluation (Signed)
Anesthesia Post Note  Patient: Karen [name]  Procedure(s) Performed: RIGHT CARPAL TUNNEL RELEASE WITH ULTRASOUND GUIDANCE (Right: Wrist)  Patient location during evaluation: PACU Anesthesia Type: General Level of consciousness: awake and alert Pain management: pain level controlled Vital Signs Assessment: post-procedure vital signs reviewed and stable Respiratory status: spontaneous breathing, nonlabored ventilation, respiratory function stable and patient connected to nasal cannula oxygen Cardiovascular status: blood pressure returned to baseline and stable Postop Assessment: no apparent nausea or vomiting Anesthetic complications: no  No notable events documented.   Last Vitals:  Vitals:   05/08/23 0815 05/08/23 0821  BP: (!) 100/57 104/86  Pulse: 76 78  Resp: 13 16  Temp:    SpO2: 98% 98%    Last Pain:  Vitals:   05/08/23 0808  TempSrc:   PainSc: 0-No pain                 Stephanie Coup

## 2023-05-08 NOTE — Transfer of Care (Signed)
Immediate Anesthesia Transfer of Care Note  Patient: Karen [name]  Procedure(s) Performed: RIGHT CARPAL TUNNEL RELEASE WITH ULTRASOUND GUIDANCE (Right: Wrist)  Patient Location: PACU  Anesthesia Type:General  Level of Consciousness: awake, alert , and drowsy  Airway & Oxygen Therapy: Patient Spontanous Breathing  Post-op Assessment: Report given to RN and Post -op Vital signs reviewed and stable  Post vital signs: Reviewed and stable  Last Vitals:  Vitals Value Taken Time  BP 119/76 05/08/23 0808  Temp    Pulse 79 05/08/23 0808  Resp 12 05/08/23 0808  SpO2 97 % 05/08/23 0808  Vitals shown include unfiled device data.  Last Pain:  Vitals:   05/08/23 0625  TempSrc: Oral  PainSc: 0-No pain         Complications: No notable events documented.

## 2023-05-08 NOTE — Anesthesia Procedure Notes (Signed)
Date/Time: 05/08/2023 7:30 AM  Performed by: Ginger Carne, CRNAPre-anesthesia Checklist: Patient identified, Emergency Drugs available, Suction available, Patient being monitored and Timeout performed Patient Re-evaluated:Patient Re-evaluated prior to induction Oxygen Delivery Method: Simple face mask Preoxygenation: Pre-oxygenation with 100% oxygen Induction Type: IV induction

## 2023-05-24 ENCOUNTER — Encounter: Payer: Self-pay | Admitting: Neurosurgery

## 2023-05-24 ENCOUNTER — Ambulatory Visit: Payer: Medicare Other | Admitting: Neurosurgery

## 2023-05-24 VITALS — BP 122/82 | Ht 68.0 in | Wt 245.0 lb

## 2023-05-24 DIAGNOSIS — G5603 Carpal tunnel syndrome, bilateral upper limbs: Secondary | ICD-10-CM

## 2023-05-24 DIAGNOSIS — Z09 Encounter for follow-up examination after completed treatment for conditions other than malignant neoplasm: Secondary | ICD-10-CM

## 2023-05-24 NOTE — Progress Notes (Signed)
   REFERRING PHYSICIAN:  [facility] [address] [place],  [place] [address]  DOS: 05/08/23 right sided carpal tunnel decompression with ultrasound guidance   HISTORY OF PRESENT ILLNESS: Karen [name] is about 2 weeks status post carpal tunnel decompression. Overall, she is doing well postoperatively.  She reports improvement in the strength in her right hand as well as the tingling sensation.  She does still intermittently have some pain into her hand with gripping things and occasionally wakes at night feeling as if she has to shake her hand out but overall feels like she has had improvement since surgery.  She still has persistent symptoms in her left hand.  PHYSICAL EXAMINATION:  NEUROLOGICAL:  General: In no acute distress.   Awake, alert, oriented to person, place, and time.    Strength: Side Biceps Triceps Deltoid Interossei Grip Wrist Ext. Wrist Flex.  R 5 5 5 5 5 5 5   L 5 5 5 5 5 5 5    Incision c/d/I and well-healed.  There is a small mobile nodule under incision which is mildly painful to deep palpation.  There are no signs of infection  Imaging:  No interval imaging to review  Assessment / Plan: Karen [name] is doing well after right sided carpal tunnel decompression.  We discussed her follow-up in detail and her initial follow-up appointment is scheduled in about 10 days with Dr. Katrinka Blazing.  We ultimately decided because she is doing well that we will reschedule her follow-up with Dr. Katrinka Blazing in about 4 weeks from now, 6 weeks from surgery and cancel her follow-up with Nehemiah Settle as this is only a few weeks after her new follow-up with Dr. Katrinka Blazing.  He will determine at her next visit should she need additional follow-up.  She was encouraged to call the office in the interim should she have any questions or concerns.  She expressed understanding was in agreement with this plan.  Advised to contact the office if any questions or concerns arise.   Manning Charity  PA-C Dept of Neurosurgery

## 2023-05-29 ENCOUNTER — Ambulatory Visit (INDEPENDENT_AMBULATORY_CARE_PROVIDER_SITE_OTHER): Payer: Medicare Other | Admitting: Podiatry

## 2023-05-29 ENCOUNTER — Encounter: Payer: Self-pay | Admitting: Podiatry

## 2023-05-29 ENCOUNTER — Ambulatory Visit (INDEPENDENT_AMBULATORY_CARE_PROVIDER_SITE_OTHER): Payer: Medicare Other

## 2023-05-29 DIAGNOSIS — M2041 Other hammer toe(s) (acquired), right foot: Secondary | ICD-10-CM | POA: Diagnosis not present

## 2023-05-29 DIAGNOSIS — M898X9 Other specified disorders of bone, unspecified site: Secondary | ICD-10-CM | POA: Diagnosis not present

## 2023-05-29 NOTE — Progress Notes (Signed)
Chief Complaint  Patient presents with   Nail Problem    "I'd like him to look at my 3rd toenail on my right foot.  It's starting to hurt."   Foot Pain    "The bone that is sticking up on my right foot is starting to bother me." N - painful bone L - 2nd met right D - 2 months O - slowly worse C - sore A - pressure T - none     HPI: 72 y.o. female presenting today for evaluation of pain and tenderness associated to the distal aspect of the right third toe.  Patient states that when she walks on the foot she has toe pain to the right third digit specifically.  History of surgery to the bilateral feet.  No recent injury.  Presenting for further treatment evaluation  Past Medical History:  Diagnosis Date   Acquired trigger finger of both middle fingers    Arthritis    Breast cancer, right (HCC) 2011   Right Lumpectomy and Rad tx's.   Depression    DVT (deep venous thrombosis) (HCC)    leg   GERD (gastroesophageal reflux disease)    Heart murmur    Heart palpitations    Hyperlipidemia    Hypertension    Hypothyroidism    Leg edema    Narcolepsy and cataplexy    Neuropathy    Obesity    Osteoporosis, post-menopausal    Pre-diabetes    RLS (restless legs syndrome)    Sleep apnea    not using cpap machine-feels like she is drowning    Past Surgical History:  Procedure Laterality Date   ABDOMINAL HYSTERECTOMY     APPENDECTOMY     BREAST SURGERY     CARDIAC CATHETERIZATION N/A 11/25/2014   Procedure: Left Heart Cath;  Surgeon: Dalia Heading, MD;  Location: [facility] INVASIVE CV LAB;  Service: Cardiovascular;  Laterality: N/A;   CARPAL TUNNEL RELEASE Right 05/08/2023   Procedure: RIGHT CARPAL TUNNEL RELEASE WITH ULTRASOUND GUIDANCE;  Surgeon: Lovenia Kim, MD;  Location: [facility] ORS;  Service: Neurosurgery;  Laterality: Right;   CHOLECYSTECTOMY     COLONOSCOPY WITH PROPOFOL N/A 08/05/2018   Procedure: COLONOSCOPY WITH PROPOFOL;  Surgeon: Christena Deem, MD;  Location:  [facility] ENDOSCOPY;  Service: Endoscopy;  Laterality: N/A;   FORAMINOTOMY 1 LEVEL Right 10/23/2022   Procedure: RIGHT L4-5 LAMINOFORAMINOTOMY;  Surgeon: Venetia Night, MD;  Location: [facility] ORS;  Service: Neurosurgery;  Laterality: Right;   MASTECTOMY Right    partial/lumpectomy    No Known Allergies   Physical Exam: General: The patient is alert and oriented x3 in no acute distress.  Dermatology: Skin is warm, dry and supple bilateral lower extremities.  Callus noted to the distal aspect of the third digits bilateral with associated tenderness with palpation  Vascular: Palpable pedal pulses bilaterally. Capillary refill within normal limits.  No appreciable edema.  No erythema.  Neurological: Grossly intact via light touch  Musculoskeletal Exam: History of surgery bilateral feet.  Residual hammertoe contracture noted to the lesser digits.  The bilateral third toes actually are contracted with excessive pressure on the distal tips of the toe creating callus and pain RT > LT  Radiographic Exam RT foot 05/29/2023:  Orthopedic screws within the first metatarsal unchanged.  Shortening of the first metatarsal noted.  The stress reaction fracture along the diaphysis of the second metatarsal is healed.  Dorsal angulation noted to the distal aspect of the second metatarsal.  Moderate  degenerative changes noted throughout the pedal joints. No acute fractures identified  Assessment/Plan of Care: 1.  Symptomatic callus secondary to hammertoe deformity third digit bilateral 2.  History of bilateral foot surgery  -Patient evaluated.  X-rays reviewed -Conservatively I do believe the patient would feel significantly better if we were able to offload pressure from the distal tip of the third digits.  Patient agrees -Silicone toe crest pads were dispensed for the patient to wear daily to offload pressure from the distal tips of the toes.  When the patient walked with him she did feel significant relief of  pressure from the distal tips of the toes -Advised against going barefoot.  Recommend socks and shoes -Return to clinic as needed    Felecia Shelling, DPM [facility]  Dr. Felecia Shelling, DPM    [address] [place], [place] [address]                Office [phone]  Fax [phone]

## 2023-06-04 ENCOUNTER — Encounter: Payer: Medicare Other | Admitting: Neurosurgery

## 2023-06-06 ENCOUNTER — Encounter: Payer: Medicare Other | Admitting: Neurosurgery

## 2023-06-25 ENCOUNTER — Ambulatory Visit (INDEPENDENT_AMBULATORY_CARE_PROVIDER_SITE_OTHER): Payer: Medicare Other | Admitting: Nurse Practitioner

## 2023-06-25 ENCOUNTER — Encounter (INDEPENDENT_AMBULATORY_CARE_PROVIDER_SITE_OTHER): Payer: Self-pay | Admitting: Nurse Practitioner

## 2023-06-25 ENCOUNTER — Ambulatory Visit (INDEPENDENT_AMBULATORY_CARE_PROVIDER_SITE_OTHER): Payer: Medicare Other | Admitting: Neurosurgery

## 2023-06-25 VITALS — BP 117/84 | HR 84 | Resp 18 | Ht 68.0 in | Wt 249.0 lb

## 2023-06-25 VITALS — BP 124/82 | Temp 98.2°F | Ht 68.0 in | Wt 249.0 lb

## 2023-06-25 DIAGNOSIS — G629 Polyneuropathy, unspecified: Secondary | ICD-10-CM | POA: Diagnosis not present

## 2023-06-25 DIAGNOSIS — M48062 Spinal stenosis, lumbar region with neurogenic claudication: Secondary | ICD-10-CM

## 2023-06-25 DIAGNOSIS — M79605 Pain in left leg: Secondary | ICD-10-CM

## 2023-06-25 DIAGNOSIS — M79604 Pain in right leg: Secondary | ICD-10-CM | POA: Diagnosis not present

## 2023-06-25 DIAGNOSIS — Z09 Encounter for follow-up examination after completed treatment for conditions other than malignant neoplasm: Secondary | ICD-10-CM

## 2023-06-25 DIAGNOSIS — G5603 Carpal tunnel syndrome, bilateral upper limbs: Secondary | ICD-10-CM

## 2023-06-25 NOTE — Progress Notes (Signed)
 Subjective:    Patient ID: Karen [name], female    DOB: 1950/07/29, 73 y.o.   MRN: [id number] Chief Complaint  Patient presents with   New Patient (Initial Visit)    np. consult. venous insufficiency. [place], [place]    Karen [name] is a 73 year old female who presents today as a referral from Dr. Katrina in regards to concern for venous insufficiency and lower extremity pain.  The patient has a significant history of spinal stenosis and given her symptoms there has been talk in consideration of a spinal stimulator.  However the patient has had issues with venous insufficiency and there was also some concern that there may be other vascular issues that could be a source of her pain.  She denies any claudication-like symptoms.  She endorses having pain when she sits for extended periods but the pain worsens when she lays down in bed.  She notes that when she is in her recliner elevating sometimes her feet become numb she is unable to feel them.  She is also a history of small fiber neuropathy as well.  Currently there are no open wounds or ulcerations but there is evidence of stasis dermatitis on the right lower extremity and the left lower extremity is notably cooler than the right.  Minimal edema present bilaterally    Review of Systems  Cardiovascular:  Positive for leg swelling.  Musculoskeletal:  Positive for arthralgias and back pain.  Skin:  Positive for color change.  All other systems reviewed and are negative.      Objective:   Physical Exam Vitals reviewed.  HENT:     Head: Normocephalic.  Cardiovascular:     Rate and Rhythm: Normal rate.     Pulses:          Dorsalis pedis pulses are 1+ on the right side and 0 on the left side.       Posterior tibial pulses are 1+ on the right side and 0 on the left side.  Pulmonary:     Effort: Pulmonary effort is normal.  Musculoskeletal:     Right lower leg: Edema present.     Left lower leg: Edema present.   Skin:    Comments: Warm right foot, cool left foot  Neurological:     Mental Status: She is alert and oriented to person, place, and time.  Psychiatric:        Mood and Affect: Mood normal.        Behavior: Behavior normal.        Thought Content: Thought content normal.        Judgment: Judgment normal.     BP 117/84   Pulse 84   Resp 18   Ht 5' 8 (1.727 m)   Wt 249 lb (112.9 kg)   BMI 37.86 kg/m   Past Medical History:  Diagnosis Date   Acquired trigger finger of both middle fingers    Arthritis    Breast cancer, right (HCC) 2011   Right Lumpectomy and Rad tx's.   Depression    DVT (deep venous thrombosis) (HCC)    leg   GERD (gastroesophageal reflux disease)    Heart murmur    Heart palpitations    Hyperlipidemia    Hypertension    Hypothyroidism    Leg edema    Narcolepsy and cataplexy    Neuropathy    Obesity    Osteoporosis, post-menopausal    Pre-diabetes    RLS (restless legs syndrome)  Sleep apnea    not using cpap machine-feels like she is drowning    Social History   Socioeconomic History   Marital status: Married    Spouse name: Not on file   Number of children: Not on file   Years of education: Not on file   Highest education level: Not on file  Occupational History   Not on file  Tobacco Use   Smoking status: Never   Smokeless tobacco: Never  Vaping Use   Vaping status: Never Used  Substance and Sexual Activity   Alcohol use: Not Currently    Alcohol/week: 5.0 standard drinks of alcohol    Types: 5 Shots of liquor per week   Drug use: No   Sexual activity: Not on file  Other Topics Concern   Not on file  Social History Narrative   Not on file   Social Drivers of Health   Financial Resource Strain: Low Risk  (04/23/2023)   Received from [facility]   Overall Financial Resource Strain (CARDIA)    Difficulty of Paying Living Expenses: Not hard at all  Food Insecurity: No Food Insecurity (04/23/2023)    Received from [facility]   Hunger Vital Sign    Worried About Running Out of Food in the Last Year: Never true    Ran Out of Food in the Last Year: Never true  Transportation Needs: No Transportation Needs (04/23/2023)   Received from [facility] - Transportation    In the past 12 months, has lack of transportation kept you from medical appointments or from getting medications?: No    Lack of Transportation (Non-Medical): No  Physical Activity: Not on file  Stress: Not on file  Social Connections: Not on file  Intimate Partner Violence: Not on file    Past Surgical History:  Procedure Laterality Date   ABDOMINAL HYSTERECTOMY     APPENDECTOMY     BREAST SURGERY     CARDIAC CATHETERIZATION N/A 11/25/2014   Procedure: Left Heart Cath;  Surgeon: Vinie DELENA Jude, MD;  Location: [facility] INVASIVE CV LAB;  Service: Cardiovascular;  Laterality: N/A;   CARPAL TUNNEL RELEASE Right 05/08/2023   Procedure: RIGHT CARPAL TUNNEL RELEASE WITH ULTRASOUND GUIDANCE;  Surgeon: Claudene Penne ORN, MD;  Location: [facility] ORS;  Service: Neurosurgery;  Laterality: Right;   CHOLECYSTECTOMY     COLONOSCOPY WITH PROPOFOL  N/A 08/05/2018   Procedure: COLONOSCOPY WITH PROPOFOL ;  Surgeon: Gaylyn Gladis PENNER, MD;  Location: [facility] ENDOSCOPY;  Service: Endoscopy;  Laterality: N/A;   FORAMINOTOMY 1 LEVEL Right 10/23/2022   Procedure: RIGHT L4-5 LAMINOFORAMINOTOMY;  Surgeon: Clois Fret, MD;  Location: [facility] ORS;  Service: Neurosurgery;  Laterality: Right;   MASTECTOMY Right    partial/lumpectomy    Family History  Problem Relation Age of Onset   Cancer Mother    Hypertension Mother    Hypertension Father    Heart disease Father    Emphysema Father    Depression Father    Cancer Sister    Hypertension Sister     No Known Allergies     Latest Ref Rng & Units 02/05/2015    4:03 PM  CBC  WBC 3.6 - 11.0 K/uL 8.2   Hemoglobin 12.0 - 16.0 g/dL 86.8   Hematocrit 64.9 -  47.0 % 40.2   Platelets 150 - 440 K/uL 184       CMP     Component Value Date/Time   NA 136 10/06/2022  1150   K 3.6 10/06/2022 1150   CL 100 10/06/2022 1150   CO2 29 10/06/2022 1150   GLUCOSE 93 10/06/2022 1150   BUN 11 10/06/2022 1150   CREATININE 0.57 10/06/2022 1150   CALCIUM 9.1 10/06/2022 1150   GFRNONAA >60 10/06/2022 1150     No results found.     Assessment & Plan:   1. Leg pain, bilateral (Primary)  Recommend:  The patient has atypical pain symptoms for pure atherosclerotic disease. However, on physical exam there is evidence of vascular disease, given the diminished pulses and the edema of the legs.  The discoloration of the patient's legs is evidence of stasis dermatitis on the right lower extremity.  Further investigation of the patient's vascular disease is necessary to determine the relationship of the patient's lower extremity symptoms and the degree of possible vascular disease.  Noninvasive studies of the will be obtained and the patient will follow up with me to review these studies.  The patient should continue walking and begin a more formal exercise program. The patient should continue his antiplatelet therapy and aggressive treatment of the lipid abnormalities.  2. Neuropathy This may also be component of the patient's ongoing discomfort  3. Neurogenic claudication due to lumbar spinal stenosis Patient currently being followed by neurosurgery.  Pending results moving forward with spinal stimulator may be beneficial for the patient.   Current Outpatient Medications on File Prior to Visit  Medication Sig Dispense Refill   B Complex-C (B-COMPLEX WITH VITAMIN C) tablet Take 1 tablet by mouth every evening.     levothyroxine (SYNTHROID, LEVOTHROID) 75 MCG tablet Take 75 mcg by mouth daily before breakfast.     lisinopril (PRINIVIL,ZESTRIL) 20 MG tablet Take 20 mg by mouth every morning.  3   Magnesium 250 MG TABS Take 250 mg by mouth every evening.      Multiple Vitamin (MULTIVITAMIN WITH MINERALS) TABS tablet Take 1 tablet by mouth every evening. Senior Multivitamin     Pancrelipase, Lip-Prot-Amyl, (CREON) 24000-76000 units CPEP Take 3 capsules by mouth 3 (three) times daily as needed (issues with digestion.).     pramipexole (MIRAPEX) 1 MG tablet Take by mouth.     torsemide (DEMADEX) 20 MG tablet Take 20 mg by mouth daily.     No current facility-administered medications on file prior to visit.    There are no Patient Instructions on file for this visit. No follow-ups on file.   Kameshia Madruga E Tyquarius Paglia, NP

## 2023-06-25 NOTE — Progress Notes (Signed)
   REFERRING PHYSICIAN:  [facility] [address] [place],  [place] [address]  DOS: 05/08/23 right sided carpal tunnel decompression with ultrasound guidance   HISTORY OF PRESENT ILLNESS: Karen [name] is about 7 weeks status post carpal tunnel decompression.  Overall she has had significant improvement in her hand.  The tingling has improved, pain has improved.  She continues to have sensation loss as expected.  She has some intermittent wrist pain when doing heavy lifting.  Incision has healed well.  PHYSICAL EXAMINATION:  NEUROLOGICAL:  General: In no acute distress.   Awake, alert, oriented to person, place, and time.    Strength: Side Biceps Triceps Deltoid Interossei Grip Wrist Ext. Wrist Flex.  R 5 5 5 5 5 5 5   L 5 5 5 5 5 5 5    Incision c/d/I and well-healed.  There is a small mobile nodule under incision which is mildly painful to deep palpation.  There are no signs of infection  Imaging:  No interval imaging to review  Assessment / Plan: Karen [name] is doing well after right sided carpal tunnel decompression.  Overall she is doing really well.  She has some intermittent wrist soreness when doing things like opening stuck jars or lifting heavy objects.  Her nighttime symptoms have improved.  Her shaking symptoms have improved.  She does continue to have loss of sensation in her fingertips which we explained will likely be permanent, that the carpal tunnel surgery is mostly helpful for the paresthesias or tingling and weakness.  She does feel like these have improved.  She continues to have left-sided symptoms as well.  We discussed booking her left-sided decompression, however she wants to wait until she has some other medical issues managed first.  We did discuss the importance of intervening before she gets permanent sensation loss in her fingers as this is likely difficult to recover.  She acknowledged understanding in situ to reach out to us  as soon as  she noticed any changes.  Advised to contact the office if any questions or concerns arise.   Karen [name] Sharps, MD Dept of Neurosurgery

## 2023-07-30 ENCOUNTER — Encounter: Payer: Medicare Other | Admitting: Physician Assistant

## 2023-07-31 ENCOUNTER — Other Ambulatory Visit (INDEPENDENT_AMBULATORY_CARE_PROVIDER_SITE_OTHER): Payer: Self-pay | Admitting: Nurse Practitioner

## 2023-07-31 DIAGNOSIS — M79604 Pain in right leg: Secondary | ICD-10-CM

## 2023-08-01 ENCOUNTER — Encounter (INDEPENDENT_AMBULATORY_CARE_PROVIDER_SITE_OTHER): Payer: Self-pay | Admitting: Nurse Practitioner

## 2023-08-01 ENCOUNTER — Ambulatory Visit (INDEPENDENT_AMBULATORY_CARE_PROVIDER_SITE_OTHER): Payer: Medicare Other | Admitting: Nurse Practitioner

## 2023-08-01 ENCOUNTER — Ambulatory Visit (INDEPENDENT_AMBULATORY_CARE_PROVIDER_SITE_OTHER): Payer: Medicare Other

## 2023-08-01 VITALS — BP 139/87 | HR 71 | Resp 16 | Wt 247.2 lb

## 2023-08-01 DIAGNOSIS — G629 Polyneuropathy, unspecified: Secondary | ICD-10-CM | POA: Diagnosis not present

## 2023-08-01 DIAGNOSIS — M48062 Spinal stenosis, lumbar region with neurogenic claudication: Secondary | ICD-10-CM | POA: Diagnosis not present

## 2023-08-01 DIAGNOSIS — M79604 Pain in right leg: Secondary | ICD-10-CM

## 2023-08-01 DIAGNOSIS — M79605 Pain in left leg: Secondary | ICD-10-CM

## 2023-08-02 ENCOUNTER — Encounter: Payer: Self-pay | Admitting: Neurosurgery

## 2023-08-02 LAB — VAS US ABI WITH/WO TBI
Left ABI: 1.2
Right ABI: 1.34

## 2023-08-03 ENCOUNTER — Other Ambulatory Visit: Payer: Self-pay

## 2023-08-03 DIAGNOSIS — I872 Venous insufficiency (chronic) (peripheral): Secondary | ICD-10-CM

## 2023-08-06 ENCOUNTER — Encounter (INDEPENDENT_AMBULATORY_CARE_PROVIDER_SITE_OTHER): Payer: Self-pay | Admitting: Nurse Practitioner

## 2023-08-06 ENCOUNTER — Other Ambulatory Visit: Payer: Self-pay

## 2023-08-06 DIAGNOSIS — I872 Venous insufficiency (chronic) (peripheral): Secondary | ICD-10-CM

## 2023-08-06 NOTE — Progress Notes (Signed)
Subjective:    Patient ID: Karen [name], female    DOB: 23-Apr-1951, 73 y.o.   MRN: [id number] Chief Complaint  Patient presents with  . Follow-up    Pt conv abi and ble venous reflux    Karen [name] is a 73 year old female who presents today as a referral from Dr. Marcell Barlow in regards to concern for venous insufficiency and lower extremity pain.  The patient has a significant history of spinal stenosis and given her symptoms there has been talk in consideration of a spinal stimulator.  However the patient has had issues with venous insufficiency and there was also some concern that there may be other vascular issues that could be a source of her pain.  She denies any claudication-like symptoms.  She endorses having pain when she sits for extended periods but the pain worsens when she lays down in bed.  She notes that when she is in her recliner elevating sometimes her feet become numb she is unable to feel them.  She is also a history of small fiber neuropathy as well.  Currently there are no open wounds or ulcerations but there is evidence of stasis dermatitis on the right lower extremity and the left lower extremity is notably cooler than the right.  Minimal edema present bilaterally  Today noninvasive studies show an ABI of 1.34 on the right and 1.20 on the left.  She has strong primarily triphasic waveforms in the bilateral tibial vessels with good toe waveforms bilaterally.  She has normal toe pressures bilaterally.  No evidence of DVT or superficial thrombophlebitis bilaterally.  No evidence of deep venous insufficiency bilaterally.  She does have evidence of venous reflux in the bilateral great saphenous veins with the right being greater than the left CKS    Review of Systems  Cardiovascular:  Positive for leg swelling.  Musculoskeletal:  Positive for arthralgias and back pain.  Skin:  Positive for color change.  All other systems reviewed and are negative.       Objective:   Physical Exam Vitals reviewed.  HENT:     Head: Normocephalic.  Cardiovascular:     Rate and Rhythm: Normal rate.     Pulses:          Dorsalis pedis pulses are detected w/ Doppler on the right side and detected w/ Doppler on the left side.       Posterior tibial pulses are detected w/ Doppler on the right side and detected w/ Doppler on the left side.  Pulmonary:     Effort: Pulmonary effort is normal.  Musculoskeletal:     Right lower leg: Edema present.     Left lower leg: Edema present.  Neurological:     Mental Status: She is alert and oriented to person, place, and time.  Psychiatric:        Mood and Affect: Mood normal.        Behavior: Behavior normal.        Thought Content: Thought content normal.        Judgment: Judgment normal.    BP 139/87   Pulse 71   Resp 16   Wt 247 lb 3.2 oz (112.1 kg)   BMI 37.59 kg/m   Past Medical History:  Diagnosis Date  . Acquired trigger finger of both middle fingers   . Arthritis   . Breast cancer, right (HCC) 2011   Right Lumpectomy and Rad tx's.  . Depression   . DVT (deep venous thrombosis) (HCC)  leg  . GERD (gastroesophageal reflux disease)   . Heart murmur   . Heart palpitations   . Hyperlipidemia   . Hypertension   . Hypothyroidism   . Leg edema   . Narcolepsy and cataplexy   . Neuropathy   . Obesity   . Osteoporosis, post-menopausal   . Pre-diabetes   . RLS (restless legs syndrome)   . Sleep apnea    not using cpap machine-feels like she is drowning    Social History   Socioeconomic History  . Marital status: Married    Spouse name: Not on file  . Number of children: Not on file  . Years of education: Not on file  . Highest education level: Not on file  Occupational History  . Not on file  Tobacco Use  . Smoking status: Never  . Smokeless tobacco: Never  Vaping Use  . Vaping status: Never Used  Substance and Sexual Activity  . Alcohol use: Not Currently    Alcohol/week: 5.0  standard drinks of alcohol    Types: 5 Shots of liquor per week  . Drug use: No  . Sexual activity: Not on file  Other Topics Concern  . Not on file  Social History Narrative  . Not on file   Social Drivers of Health   Financial Resource Strain: Low Risk  (04/23/2023)   [facility]   Overall Financial Resource Strain (CARDIA)   . Difficulty of Paying Living Expenses: Not hard at all  Food Insecurity: No Food Insecurity (04/23/2023)   Received from [facility]   Hunger Vital Sign   . Worried About Programme researcher, broadcasting/film/video in the Last Year: Never true   . Ran Out of Food in the Last Year: Never true  Transportation Needs: No Transportation Needs (04/23/2023)   [facility]   [facility] - Transportation   . In the past 12 months, has lack of transportation kept you from medical appointments or from getting medications?: No   . Lack of Transportation (Non-Medical): No  Physical Activity: Not on file  Stress: Not on file  Social Connections: Not on file  Intimate Partner Violence: Not on file    Past Surgical History:  Procedure Laterality Date  . ABDOMINAL HYSTERECTOMY    . APPENDECTOMY    . BREAST SURGERY    . CARDIAC CATHETERIZATION N/A 11/25/2014   Procedure: Left Heart Cath;  Surgeon: Dalia Heading, MD;  Location: [facility] INVASIVE CV LAB;  Service: Cardiovascular;  Laterality: N/A;  . CARPAL TUNNEL RELEASE Right 05/08/2023   Procedure: RIGHT CARPAL TUNNEL RELEASE WITH ULTRASOUND GUIDANCE;  Surgeon: Lovenia Kim, MD;  Location: [facility] ORS;  Service: Neurosurgery;  Laterality: Right;  . CHOLECYSTECTOMY    . COLONOSCOPY WITH PROPOFOL N/A 08/05/2018   Procedure: COLONOSCOPY WITH PROPOFOL;  Surgeon: Christena Deem, MD;  Location: [facility] ENDOSCOPY;  Service: Endoscopy;  Laterality: N/A;  . FORAMINOTOMY 1 LEVEL Right 10/23/2022   Procedure: RIGHT L4-5 LAMINOFORAMINOTOMY;  Surgeon: Venetia Night, MD;   Location: [facility] ORS;  Service: Neurosurgery;  Laterality: Right;  . MASTECTOMY Right    partial/lumpectomy    Family History  Problem Relation Age of Onset  . Cancer Mother   . Hypertension Mother   . Hypertension Father   . Heart disease Father   . Emphysema Father   . Depression Father   . Cancer Sister   . Hypertension Sister     No Known Allergies  Latest Ref Rng & Units 02/05/2015    4:03 PM  CBC  WBC 3.6 - 11.0 K/uL 8.2   Hemoglobin 12.0 - 16.0 g/dL 28.4   Hematocrit 13.2 - 47.0 % 40.2   Platelets 150 - 440 K/uL 184       CMP     Component Value Date/Time   NA 136 10/06/2022 1150   K 3.6 10/06/2022 1150   CL 100 10/06/2022 1150   CO2 29 10/06/2022 1150   GLUCOSE 93 10/06/2022 1150   BUN 11 10/06/2022 1150   CREATININE 0.57 10/06/2022 1150   CALCIUM 9.1 10/06/2022 1150   GFRNONAA >60 10/06/2022 1150     No results found.     Assessment & Plan:   1. Leg pain, bilateral (Primary) Based on the patient's results I suspect that some of her pain is multifactorial but the larger component is related to neuropathic pain from her lower back.  She notes that her biggest complaint is the numbness and tingling discomfort that she has.  She does have notable issues with her veins but this is more likely to contribute to her swelling.  I discussed with the patient that she should continue with conservative therapy including use of medical grade compression and elevation.  We also discussed endovenous laser ablation of her varicosities but given that her symptoms are more so consistent with her lower back issues, she we will move forward with the spinal stimulator that was previously discussed with neurosurgery.  She will follow-up with [place] if she continues to have issues post stimulator placement.  2. Neuropathy This may also be component of the patient's ongoing discomfort  3. Neurogenic claudication due to lumbar spinal stenosis Following the patient's noninvasive  studies, the majority of her pain is likely resulting from her underlying back issues.  She is recommended to move forward with a spinal stimulator as discussed by neurosurgery.  We will have her follow-up if she continues to have pain or issues post spinal stimulator.   Current Outpatient Medications on File Prior to Visit  Medication Sig Dispense Refill  . B Complex-C (B-COMPLEX WITH VITAMIN C) tablet Take 1 tablet by mouth every evening.    Marland Kitchen levothyroxine (SYNTHROID, LEVOTHROID) 75 MCG tablet Take 75 mcg by mouth daily before breakfast.    . lisinopril (PRINIVIL,ZESTRIL) 20 MG tablet Take 20 mg by mouth every morning.  3  . Magnesium 250 MG TABS Take 250 mg by mouth every evening.    . Multiple Vitamin (MULTIVITAMIN WITH MINERALS) TABS tablet Take 1 tablet by mouth every evening. Senior Multivitamin    . Pancrelipase, Lip-Prot-Amyl, (CREON) 24000-76000 units CPEP Take 3 capsules by mouth 3 (three) times daily as needed (issues with digestion.).    Marland Kitchen pramipexole (MIRAPEX) 1 MG tablet Take by mouth.    . torsemide (DEMADEX) 20 MG tablet Take 20 mg by mouth daily.     No current facility-administered medications on file prior to visit.    There are no Patient Instructions on file for this visit. No follow-ups on file.   Georgiana Spinner, NP

## 2023-08-16 ENCOUNTER — Ambulatory Visit
Admission: RE | Admit: 2023-08-16 | Discharge: 2023-08-16 | Disposition: A | Discharge status: Home / Self Care | Payer: Medicare Other | Source: Ambulatory Visit | Attending: Neurosurgery | Admitting: Neurosurgery

## 2023-08-16 DIAGNOSIS — I872 Venous insufficiency (chronic) (peripheral): Secondary | ICD-10-CM | POA: Diagnosis present

## 2023-09-18 ENCOUNTER — Encounter: Payer: Self-pay | Admitting: Student in an Organized Health Care Education/Training Program

## 2023-09-18 ENCOUNTER — Other Ambulatory Visit: Payer: Self-pay | Admitting: Student in an Organized Health Care Education/Training Program

## 2023-09-18 ENCOUNTER — Ambulatory Visit: Payer: Medicare Other | Admitting: Student in an Organized Health Care Education/Training Program

## 2023-09-18 ENCOUNTER — Ambulatory Visit
Admission: RE | Admit: 2023-09-18 | Discharge: 2023-09-18 | Disposition: A | Discharge status: Home / Self Care | Source: Ambulatory Visit | Attending: Student in an Organized Health Care Education/Training Program | Admitting: Student in an Organized Health Care Education/Training Program

## 2023-09-18 VITALS — BP 131/78 | HR 67 | Temp 97.4°F | Resp 16 | Ht 68.0 in | Wt 245.0 lb

## 2023-09-18 DIAGNOSIS — G8929 Other chronic pain: Secondary | ICD-10-CM | POA: Insufficient documentation

## 2023-09-18 DIAGNOSIS — M5416 Radiculopathy, lumbar region: Secondary | ICD-10-CM | POA: Diagnosis present

## 2023-09-18 DIAGNOSIS — G894 Chronic pain syndrome: Secondary | ICD-10-CM

## 2023-09-18 DIAGNOSIS — M961 Postlaminectomy syndrome, not elsewhere classified: Secondary | ICD-10-CM | POA: Insufficient documentation

## 2023-09-18 NOTE — Progress Notes (Signed)
 Patient: Karen Karen [name]  Service Category: E/M  Provider: Edward Jolly, MD  DOB: Oct 23, 1950  DOS: 09/18/2023  Referring Provider: Venetia Night, MD  MRN: [id number]  Setting: Ambulatory outpatient  PCP: Karen Mainland, MD  Type: New Patient  Specialty: Interventional Pain Management    Location: Office  Delivery: Face-to-face     Primary Reason(s) for Visit: Encounter for initial evaluation of one or more chronic problems (new to examiner) potentially causing chronic pain, and posing a threat to normal musculoskeletal function. (Level of risk: High) CC: Leg Pain  HPI  Karen Karen [name] is a 73 y.o. year old, female patient, who comes for the first time to our practice referred by Karen Night, MD for our initial evaluation of her chronic pain. She has Adult BMI 35.0-35.9 kg/sq m; Arthritis; Neuropathy; Leg pain, bilateral; Achilles tendinosis; RLS (restless legs syndrome); Chronic deep vein thrombosis (DVT) of proximal vein of lower extremity (HCC); Benign breast lumps; BRCA negative; Cancer (HCC); Chills without fever; Chronic pain of left heel; Depression; Hyperlipidemia; Hypertension; Hyponatremia; Increased band cell count; Irritable bowel syndrome with constipation and diarrhea; Malignant neoplasm of right female breast (HCC); Multiple joint pain; Obesity, morbid (HCC); Obstructive sleep apnea syndrome; Osteoporosis, post-menopausal; Other headache syndrome; Other insomnia; Prediabetes; Vitamin D deficiency; Weight gain with edema; Tachycardia; Pain due to onychomycosis of toenails of both feet; Neurogenic claudication due to lumbar spinal stenosis; Bilateral carpal tunnel syndrome; Failed back surgical syndrome; Chronic radicular lumbar pain; and Chronic pain syndrome on their problem list. Today she comes in for evaluation of her Leg Pain  Pain Assessment: Location: Lower, Right, Left Leg Radiating: from below the knee down lower leg anterior and posterior down to bottom of feet and  toes bilateral Onset: More than a month ago Duration: Chronic pain Quality: Aching, Burning, Constant, Cramping, Heaviness, Pressure, Sharp, Shooting, Tingling ('Electrical") Severity: 6 /10 (subjective, self-reported pain score)  Effect on ADL: Limtis ADLs. Unable to sleep due to pain abougt 2-3 hours at Karen [name]. have to take breaks often. Unable to sit ot stand to long. Balance is off when pain is worse. Timing: Constant Modifying factors: Cold packs and warm showers BP: 131/78  HR: 67  Onset and Duration: Gradual, Date of onset: 2020, and Present less than 3 months Cause of pain: Unknown Severity: Getting worse, NAS-11 at its worse: 10/10, NAS-11 at its best: 5/10, NAS-11 now: 8/10, and NAS-11 on the average: 8/10 Timing: Not influenced by the time of the day Aggravating Factors: Climbing, Intercourse (sex), Kneeling, Motion, Prolonged sitting, Prolonged standing, Squatting, Stooping , Twisting, Walking, Walking uphill, and Walking downhill Alleviating Factors: Cold packs, Nerve blocks, and Warm showers or baths Associated Problems: Color changes, Day-time cramps, Karen [name]-time cramps, Inability to concentrate, Inability to control bladder (urine), Numbness, Sadness, Spasms, Swelling, Temperature changes, Tingling, Weakness, Pain that wakes patient up, and Pain that does not allow patient to sleep Quality of Pain: Aching, Agonizing, Annoying, Burning, Constant, Cramping, Deep, Disabling, Distressing, Dreadful, Exhausting, Feeling of constriction, Feeling of weight, Heavy, Horrible, Hot, Pressure-like, Punishing, Sharp, Shooting, Stabbing, Tingling, and Uncomfortable Previous Examinations or Tests: Biopsy, EMG/PNCV, MRI scan, Nerve block, Nerve conduction test, Neurological evaluation, and Neurosurgical evaluation Previous Treatments: Epidural steroid injections and Steroid treatments by mouth  Karen Karen [name] is being evaluated for possible interventional pain management therapies for the treatment  of her chronic pain.  Discussed the use of AI scribe software for clinical note transcription with the patient, who gave verbal consent to proceed.  History of Present Illness  Karen Karen [name] is a 73 year old female who presents for evaluation for a spinal cord stimulator Karen [name]. She was referred by Karen Karen [name] for evaluation of her chronic leg pain.  She has been experiencing increasing pain in both legs from the knees down over the past several years. The pain ranges from tightness to electrical shocks and throbbing, with increasing intensity over time.  She has previously tried injections (ESI) for pain relief, which were not helpful. In May of the previous year, she underwent spinal decompression surgery at the L4-L5 level, which did not involve the placement of hardware or screws. She has done PT in the past.  She has completed a psychological evaluation in preparation for a spinal cord stimulator Karen [name]. She has also undergone an MRI of her thoracic spine as part of the pre-evaluation process.  She has a history of deep vein thrombosis (DVT) for which she was previously on apixaban, but she is not currently on blood thinners.  She has a trip scheduled from April 15th to April 30th and plans to visit her granddaughter in [place] for her birthday on May 12th.        Meds   Current Outpatient Medications:    B Complex-C (B-COMPLEX WITH VITAMIN C) tablet, Take 1 tablet by mouth every evening., Disp: , Rfl:    levothyroxine (SYNTHROID, LEVOTHROID) 75 MCG tablet, Take 75 mcg by mouth daily before breakfast., Disp: , Rfl:    lisinopril (PRINIVIL,ZESTRIL) 20 MG tablet, Take 20 mg by mouth every morning., Disp: , Rfl: 3   pramipexole (MIRAPEX) 1 MG tablet, Take by mouth., Disp: , Rfl:    torsemide (DEMADEX) 20 MG tablet, Take 20 mg by mouth daily. (Patient not taking: Reported on 09/18/2023), Disp: , Rfl:   Imaging Review  Cervical Imaging: Cervical MR wo contrast: Results for orders  placed during the hospital encounter of 12/11/22  MR CERVICAL SPINE WO CONTRAST  Narrative CLINICAL DATA:  Upper back pain and numbness in right fingers and hand  EXAM: MRI CERVICAL SPINE WITHOUT CONTRAST  TECHNIQUE: Multiplanar, multisequence MR imaging of the cervical spine was performed. No intravenous contrast was administered.  COMPARISON:  01/26/2022  FINDINGS: Evaluation is limited by motion.  Alignment: No significant listhesis.  Vertebrae: No acute fracture, evidence of discitis, or suspicious osseous lesion.  Cord: Normal signal and morphology.  Posterior Fossa, vertebral arteries, paraspinal tissues: Negative.  Disc levels:  C2-C3: Right paracentral disc protrusion. Bilateral facet arthropathy. No spinal canal stenosis. Mild right neural foraminal narrowing, slightly progressed from the prior exam.  C3-C4: Left subarticular disc protrusion. Facet and uncovertebral hypertrophy. No spinal canal stenosis. Severe bilateral neural foraminal narrowing, unchanged.  C4-C5: No significant disc bulge. Facet and uncovertebral hypertrophy. No spinal canal stenosis. No neural foraminal narrowing.  C5-C6: Mild disc bulge. Facet and uncovertebral hypertrophy. No spinal canal stenosis. Severe right and moderate to severe left neural foraminal narrowing is suspected, although this level is particularly motion limited.  C6-C7: Mild disc bulge. Facet and uncovertebral hypertrophy. Mild spinal canal stenosis. Moderate bilateral neural foraminal narrowing, similar to prior.  C7-T1: No significant disc bulge. Left-greater-than-right facet arthropathy. No spinal canal stenosis or neuroforaminal narrowing.  IMPRESSION: 1. C6-C7 mild spinal canal stenosis and moderate bilateral neural foraminal narrowing. 2. C5-C6 severe right and moderate to severe left neural foraminal narrowing. 3. C3-C4 severe bilateral neural foraminal narrowing. 4. C2-C3 mild right neural  foraminal narrowing.   Electronically Signed By: Wiliam Ke M.D. On: 12/18/2022 01:40  MR THORACIC SPINE WO CONTRAST  Narrative CLINICAL DATA:  Back pain radiating down the legs bilaterally with numbness and tingling in the legs.  EXAM: MRI THORACIC SPINE WITHOUT CONTRAST  TECHNIQUE: Multiplanar, multisequence MR imaging of the thoracic spine was performed. No intravenous contrast was administered.  COMPARISON:  Thoracic spine MRI 03/19/2019.  FINDINGS: No significant change in vertebral body height. No listhesis. Multilevel mild disc space narrowing is increased remains most notable involving the mid and lower disc spaces Thoracic spinal cord is normal in signal intensity. A few small disc osteophyte complexes are present and these cause no significant central canal stenosis. No evidence for a severe neural foraminal narrowing. Bone marrow signal is mildly heterogeneous.  IMPRESSION: Mild multilevel degenerative change without significant central canal stenosis or severe neural foraminal narrowing.   Electronically Signed By: Ala Bent M.D. On: 09/08/2023 08:48    Lumbosacral Imaging: Lumbar MR wo contrast: Results for orders placed during the hospital encounter of 12/25/18  MR LUMBAR SPINE WO CONTRAST  Narrative CLINICAL DATA:  Upper and lower extremity numbness and tingling for 6 weeks.  EXAM: MRI LUMBAR SPINE WITHOUT CONTRAST  TECHNIQUE: Multiplanar, multisequence MR imaging of the lumbar spine was performed. No intravenous contrast was administered.  COMPARISON:  None.  FINDINGS: Segmentation: There are five lumbar type vertebral bodies. The last full intervertebral disc space is labeled L5-S1.  Alignment: Degenerative lumbar spondylosis with mild multilevel degenerative subluxations due to facet disease. No pars defects. The overall alignment is maintained.  Vertebrae:  No bone lesions or fractures.  Conus medullaris and cauda  equina: Conus extends to the L1 level. Conus and cauda equina appear normal.  Paraspinal and other soft tissues: No significant paraspinal or retroperitoneal findings.  Disc levels:  T12-L1: Degenerative disc disease with a slight bulging annulus. There is mild impression on the ventral thecal sac but no neural compression. There is a dilated nerve root sheath on the left versus a perineural cyst.  L1-2: Bulging annulus and mild osteophytic ridging along with moderate facet disease but no disc protrusions, spinal or foraminal stenosis.  L2-3: Bulging degenerated annulus, osteophytic ridging and facet disease with mild bilateral lateral recess stenosis but no significant spinal or foraminal stenosis.  L3-4: Slight bulging uncovered disc and moderate facet disease with mild bilateral lateral recess stenosis, left greater than right. No significant spinal or foraminal stenosis.  L4-5: Broad-based disc protrusion with significant mass effect on the thecal sac. This in combination with advanced facet disease and ligamentum flavum thickening contributes to moderately severe spinal and bilateral lateral recess stenosis, right greater than left. No foraminal stenosis.  L5-S1: Mild degenerative anterolisthesis of L5 due to advanced facet disease but no disc protrusions, spinal or foraminal stenosis.  IMPRESSION: 1. Moderately severe multifactorial spinal and bilateral lateral recess stenosis, right greater than left at L4-5. 2. Mild bilateral lateral recess stenosis at L3-4, left greater than right. 3. Mild bilateral lateral recess stenosis at L2-3.   Electronically Signed By: Rudie Meyer M.D. On: 12/26/2018 09:13   DG Lumbar Spine 2-3 Views  Narrative CLINICAL DATA:  L4-5 foraminotomy.  EXAM: LUMBAR SPINE - 2-3 VIEW; DG C-ARM 1-60 MIN-NO REPORT  Radiation exposure index: 7.4371 mGy.  COMPARISON:  MRI of December 25, 2018.  FINDINGS: Two intraoperative fluoroscopic  images were obtained of the lower lumbar spine. These demonstrate surgical probes at approximately the L4-5 level.  IMPRESSION: Fluoroscopic guidance provided during L4-5 surgery.   Electronically Signed By: Lupita Raider M.D. On:  10/23/2022 15:03   Complexity Note: Imaging results reviewed.                         ROS  Cardiovascular: High blood pressure Pulmonary or Respiratory: Shortness of breath Neurological: Abnormal skin sensations (Peripheral Neuropathy) and Incontinence:  Urinary Psychological-Psychiatric: Anxiousness and Difficulty sleeping and or falling asleep Gastrointestinal: Reflux or heatburn and Alternating episodes iof diarrhea and constipation (IBS-Irritable bowe syndrome) Genitourinary: No reported renal or genitourinary signs or symptoms such as difficulty voiding or producing urine, peeing blood, non-functioning kidney, kidney stones, difficulty emptying the bladder, difficulty controlling the flow of urine, or chronic kidney disease Hematological: No reported hematological signs or symptoms such as prolonged bleeding, low or poor functioning platelets, bruising or bleeding easily, hereditary bleeding problems, low energy levels due to low hemoglobin or being anemic Endocrine: Slow thyroid Rheumatologic: Joint aches and or swelling due to excess weight (Osteoarthritis) Musculoskeletal: Negative for myasthenia gravis, muscular dystrophy, multiple sclerosis or malignant hyperthermia Work History: Retired  Allergies  Karen Karen [name] has no known allergies.  Laboratory Chemistry Profile   Renal Lab Results  Component Value Date   BUN 11 10/06/2022   CREATININE 0.57 10/06/2022   GFRAA >60 02/05/2015   GFRNONAA >60 10/06/2022   PROTEINUR NEGATIVE 10/06/2022     Electrolytes Lab Results  Component Value Date   NA 136 10/06/2022   K 3.6 10/06/2022   CL 100 10/06/2022   CALCIUM 9.1 10/06/2022     Hepatic No results found for: "AST", "ALT", "ALBUMIN",  "ALKPHOS", "AMYLASE", "LIPASE", "AMMONIA"   ID Lab Results  Component Value Date   STAPHAUREUS NEGATIVE 10/06/2022   MRSAPCR NEGATIVE 10/06/2022     Bone No results found for: "VD25OH", "VD125OH2TOT", "VW0981XB1", "YN8295AO1", "25OHVITD1", "25OHVITD2", "25OHVITD3", "TESTOFREE", "TESTOSTERONE"   Endocrine Lab Results  Component Value Date   GLUCOSE 93 10/06/2022   GLUCOSEU NEGATIVE 10/06/2022   HGBA1C 5.5 10/06/2022     Neuropathy Lab Results  Component Value Date   HGBA1C 5.5 10/06/2022     CNS No results found for: "COLORCSF", "APPEARCSF", "RBCCOUNTCSF", "WBCCSF", "POLYSCSF", "LYMPHSCSF", "EOSCSF", "PROTEINCSF", "GLUCCSF", "JCVIRUS", "CSFOLI", "IGGCSF", "LABACHR", "ACETBL"   Inflammation (CRP: Acute  ESR: Chronic) No results found for: "CRP", "ESRSEDRATE", "LATICACIDVEN"   Rheumatology No results found for: "RF", "ANA", "LABURIC", "URICUR", "LYMEIGGIGMAB", "LYMEABIGMQN", "HLAB27"   Coagulation Lab Results  Component Value Date   PLT 184 02/05/2015     Cardiovascular Lab Results  Component Value Date   HGB 13.1 02/05/2015   HCT 40.2 02/05/2015     Screening Lab Results  Component Value Date   STAPHAUREUS NEGATIVE 10/06/2022   MRSAPCR NEGATIVE 10/06/2022     Cancer No results found for: "CEA", "CA125", "LABCA2"   Allergens No results found for: "ALMOND", "APPLE", "ASPARAGUS", "AVOCADO", "BANANA", "BARLEY", "BASIL", "BAYLEAF", "GREENBEAN", "LIMABEAN", "WHITEBEAN", "BEEFIGE", "REDBEET", "BLUEBERRY", "BROCCOLI", "CABBAGE", "MELON", "CARROT", "CASEIN", "CASHEWNUT", "CAULIFLOWER", "CELERY"     Note: Lab results reviewed.  PFSH  Drug: Karen Karen [name]  reports no history of drug use. Alcohol:  reports that she does not currently use alcohol after a past usage of about 5.0 standard drinks of alcohol per week. Tobacco:  reports that she has never smoked. She has never used smokeless tobacco. Medical:  has a past medical history of Acquired trigger finger of both middle  fingers, Arthritis, Breast cancer, right (HCC) (2011), Depression, DVT (deep venous thrombosis) (HCC), GERD (gastroesophageal reflux disease), Heart murmur, Heart palpitations, Hyperlipidemia, Hypertension, Hypothyroidism, Leg edema, Narcolepsy and cataplexy, Neuropathy,  Obesity, Osteoporosis, post-menopausal, Pre-diabetes, RLS (restless legs syndrome), and Sleep apnea. Family: family history includes Cancer in her mother and sister; Depression in her father; Emphysema in her father; Heart disease in her father; Hypertension in her father, mother, and sister.  Past Surgical History:  Procedure Laterality Date   ABDOMINAL HYSTERECTOMY     APPENDECTOMY     BREAST SURGERY     CARDIAC CATHETERIZATION N/A 11/25/2014   Procedure: Left Heart Cath;  Surgeon: Dalia Heading, MD;  Location: [facility] INVASIVE CV LAB;  Service: Cardiovascular;  Laterality: N/A;   CARPAL TUNNEL RELEASE Right 05/08/2023   Procedure: RIGHT CARPAL TUNNEL RELEASE WITH ULTRASOUND GUIDANCE;  Surgeon: Lovenia Kim, MD;  Location: [facility] ORS;  Service: Neurosurgery;  Laterality: Right;   CHOLECYSTECTOMY     COLONOSCOPY WITH PROPOFOL N/A 08/05/2018   Procedure: COLONOSCOPY WITH PROPOFOL;  Surgeon: Christena Deem, MD;  Location: [facility] ENDOSCOPY;  Service: Endoscopy;  Laterality: N/A;   FORAMINOTOMY 1 LEVEL Right 10/23/2022   Procedure: RIGHT L4-5 LAMINOFORAMINOTOMY;  Surgeon: Karen Night, MD;  Location: [facility] ORS;  Service: Neurosurgery;  Laterality: Right;   MASTECTOMY Right    partial/lumpectomy   Active Ambulatory Problems    Diagnosis Date Noted   Adult BMI 35.0-35.9 kg/sq m 12/24/2014   Arthritis 03/26/2017   Neuropathy 03/26/2017   Leg pain, bilateral 03/26/2017   Achilles tendinosis 03/26/2017   RLS (restless legs syndrome) 03/26/2017   Chronic deep vein thrombosis (DVT) of proximal vein of lower extremity (HCC) 03/26/2017   Benign breast lumps 11/26/2017   BRCA negative 01/20/2014   Cancer (HCC) 11/26/2017    Chills without fever 12/26/2016   Chronic pain of left heel 12/26/2016   Depression 11/26/2017   Hyperlipidemia 11/26/2017   Hypertension 11/26/2017   Hyponatremia 10/21/2016   Increased band cell count 12/26/2016   Irritable bowel syndrome with constipation and diarrhea 01/20/2015   Malignant neoplasm of right female breast (HCC) 12/15/2013   Multiple joint pain 07/22/2014   Obesity, morbid (HCC) 01/20/2015   Obstructive sleep apnea syndrome 12/03/2014   Osteoporosis, post-menopausal 11/26/2017   Other headache syndrome 11/07/2016   Other insomnia 01/02/2017   Prediabetes 08/08/2016   Vitamin D deficiency 11/14/2016   Weight gain with edema 11/14/2016   Tachycardia 03/01/2018   Pain due to onychomycosis of toenails of both feet 04/24/2022   Neurogenic claudication due to lumbar spinal stenosis 10/23/2022   Bilateral carpal tunnel syndrome 01/23/2023   Failed back surgical syndrome 09/18/2023   Chronic radicular lumbar pain 09/18/2023   Chronic pain syndrome 09/18/2023   Resolved Ambulatory Problems    Diagnosis Date Noted   No Resolved Ambulatory Problems   Past Medical History:  Diagnosis Date   Acquired trigger finger of both middle fingers    Breast cancer, right (HCC) 2011   DVT (deep venous thrombosis) (HCC)    GERD (gastroesophageal reflux disease)    Heart murmur    Heart palpitations    Hypothyroidism    Leg edema    Narcolepsy and cataplexy    Obesity    Pre-diabetes    Sleep apnea    Constitutional Exam  General appearance: Well nourished, well developed, and well hydrated. In no apparent acute distress Vitals:   09/18/23 0906  BP: 131/78  Pulse: 67  Resp: 16  Temp: (!) 97.4 F (36.3 C)  TempSrc: Temporal  SpO2: 100%  Weight: 245 lb (111.1 kg)  Height: 5\' 8"  (1.727 m)   BMI Assessment: Estimated body mass index is  37.25 kg/m as calculated from the following:   Height as of this encounter: 5\' 8"  (1.727 m).   Weight as of this encounter: 245 lb  (111.1 kg).  BMI interpretation table: BMI level Category Range association with higher incidence of chronic pain  <18 kg/m2 Underweight   18.5-24.9 kg/m2 Ideal body weight   25-29.9 kg/m2 Overweight Increased incidence by 20%  30-34.9 kg/m2 Obese (Class I) Increased incidence by 68%  35-39.9 kg/m2 Severe obesity (Class II) Increased incidence by 136%  >40 kg/m2 Extreme obesity (Class III) Increased incidence by 254%   Patient's current BMI Ideal Body weight  Body mass index is 37.25 kg/m. Ideal body weight: 63.9 kg (140 lb 14 oz) Adjusted ideal body weight: 82.8 kg (182 lb 8.4 oz)   BMI Readings from Last 4 Encounters:  09/18/23 37.25 kg/m  08/01/23 37.59 kg/m  06/25/23 37.86 kg/m  06/25/23 37.86 kg/m   Wt Readings from Last 4 Encounters:  09/18/23 245 lb (111.1 kg)  08/01/23 247 lb 3.2 oz (112.1 kg)  06/25/23 249 lb (112.9 kg)  06/25/23 249 lb (112.9 kg)    Psych/Mental status: Alert, oriented x 3 (person, place, & time)       Eyes: PERLA Respiratory: No evidence of acute respiratory distress  Thoracic Spine Area Exam  Skin & Axial Inspection: No masses, redness, or swelling Alignment: Symmetrical Functional ROM: Unrestricted ROM Stability: No instability detected Muscle Tone/Strength: Functionally intact. No obvious neuro-muscular anomalies detected. Sensory (Neurological): Unimpaired Muscle strength & Tone: No palpable anomalies Lumbar Spine Area Exam  Skin & Axial Inspection: Well healed scar from previous spine surgery detected Alignment: Symmetrical Functional ROM: Pain restricted ROM affecting both sides Stability: No instability detected Muscle Tone/Strength: Functionally intact. No obvious neuro-muscular anomalies detected. Sensory (Neurological): Dermatomal pain pattern Palpation: No palpable anomalies        Gait & Posture Assessment  Ambulation: Unassisted Gait: Relatively normal for age and body habitus Posture: WNL  Lower Extremity Exam     Side: Right lower extremity  Side: Left lower extremity  Stability: No instability observed          Stability: No instability observed          Skin & Extremity Inspection: Skin color, temperature, and hair growth are WNL. No peripheral edema or cyanosis. No masses, redness, swelling, asymmetry, or associated skin lesions. No contractures.  Skin & Extremity Inspection: Skin color, temperature, and hair growth are WNL. No peripheral edema or cyanosis. No masses, redness, swelling, asymmetry, or associated skin lesions. No contractures.  Functional ROM: Pain restricted ROM for hip and knee joints          Functional ROM: Pain restricted ROM for hip and knee joints          Muscle Tone/Strength: Functionally intact. No obvious neuro-muscular anomalies detected.  Muscle Tone/Strength: Functionally intact. No obvious neuro-muscular anomalies detected.  Sensory (Neurological): Neurogenic pain pattern        Sensory (Neurological): Neurogenic pain pattern        DTR: Patellar: deferred today Achilles: deferred today Plantar: deferred today  DTR: Patellar: deferred today Achilles: deferred today Plantar: deferred today  Palpation: No palpable anomalies  Palpation: No palpable anomalies    Assessment  Primary Diagnosis & Pertinent Problem List: The primary encounter diagnosis was Lumbar post-laminectomy syndrome. Diagnoses of Failed back surgical syndrome, Chronic radicular lumbar pain, and Chronic pain syndrome were also pertinent to this visit.  Visit Diagnosis (New problems to examiner): 1. Lumbar post-laminectomy syndrome  2. Failed back surgical syndrome   3. Chronic radicular lumbar pain   4. Chronic pain syndrome    Plan of Care (Initial workup plan)   I explained to pt how a spinal cord stimulation (SCS) is indicated for chronic, intractable pain, especially in cases of failed back surgery syndrome, radiculopathy, or pain syndromes that affect broader areas such as the back and legs.   SCS devices provide continuous electrical impulses to the spinal cord, modulating pain signals before they reach the brain.  We discussed the potential benefits and risks of spinal cord stimulation. Benefits: can provide substantial relief from chronic pain, long-term therapy with programmable settings, often reduces the need for oral medications, including opioids, some patients experience reduced dependency on other pain interventions. Risks (including but not limited to): Surgical risks, including infection, lead migration/fracture, and hardware malfunction, long-term implantation requires ongoing maintenance and possible reoperation, variable effectiveness: some patients do not experience sufficient pain relief.  I discussed  percutaneous spinal cord stimulator Karen [name] with the patient in detail. I explained to the patient that they will have an external power source and programmer which the patient will use for 7 days. There will be daily communication with the stimulator company and the patient. A possible need for a mid-Karen [name] clinic visit to give the patient the best chance of success.   Patient has completed  psychosocial behavioral evaluation.   I evaluated her interlaminar windows under live fluoroscopy and they appear patent for percutaneous access  Some of patient's pain does seem to be mechanical in nature, with some component of neurogenic pain as well. We discussed the indications for spinal cord stimulation, specifically stating that it is typically better for neuropathic and appendicular pain, but that we have had some success in the treatment of low back and hip pain.   Patient is interested in proceeding with spinal cord stimulation Karen [name]. He understands that this may not be successful, and that spinal cord stimulation in general is not a "magic bullet."   We had a lengthy and very detailed discussion of all the risks, benefits, alternatives, and rationale of surgery as well as the  option of continuing nonsurgical therapies. We specifically discussed the risks of temporary or permanent worsened neurologic injury, no symptomatic relief or pain made worse after procedure, and also the need for future surgery (due to infection, CSF leak, bleeding, adjacent segment issues, bone-healing difficulties, and other related issues). No guarantees of outcome were made or implied and she is eager to proceed and presents for definitive treatment.   She told me that all of her questions were answered thoroughly and to her satisfaction. Confidence and understanding of the discussed risks and consequences of  treatment was expressed and she accepted these risks and was eager to proceed with procedure.   Issues concerning treatment and diagnosis were discussed with the patient. There are no barriers to understanding the plan of treatment. Explanation was well received by patient and/or family who then verbalized understanding.    Procedure Orders         Karen Karen [name]     Provider-requested follow-up: Return in about 6 weeks (around 10/29/2023) for [employer] SCS Karen [name], ECT.  Future Appointments  Date Time Provider Department Center  01/29/2024  8:30 AM Georgiana Spinner, NP [facility] None   I discussed the assessment and treatment plan with the patient. The patient was provided an opportunity to ask questions and all were answered. The patient agreed with the plan and demonstrated an understanding  of the instructions.  Patient advised to call back or seek an in-person evaluation if the symptoms or condition worsens.  Duration of encounter: .  Total time on encounter, as per AMA guidelines included both the face-to-face and non-face-to-face time personally spent by the physician and/or other qualified health care professional(s) on the day of the encounter (includes time in activities that require the physician or other qualified health care professional and does not include time in  activities normally performed by clinical staff). Physician's time may include the following activities when performed: Preparing to see the patient (e.g., pre-charting review of records, searching for previously ordered imaging, lab work, and nerve conduction tests) Review of prior analgesic pharmacotherapies. Reviewing PMP Interpreting ordered tests (e.g., lab work, imaging, nerve conduction tests) Performing post-procedure evaluations, including interpretation of diagnostic procedures Obtaining and/or reviewing separately obtained history Performing a medically appropriate examination and/or evaluation Counseling and educating the patient/family/caregiver Ordering medications, tests, or procedures Referring and communicating with other health care professionals (when not separately reported) Documenting clinical information in the electronic or other health record Independently interpreting results (not separately reported) and communicating results to the patient/ family/caregiver Care coordination (not separately reported)  Note by: Karen Jolly, MD Date: 09/18/2023; Time: 9:52 AM

## 2023-09-18 NOTE — Patient Instructions (Addendum)
 Cleanse your back with soap solution night before trial   ______________________________________________________________________    Procedure instructions  Stop blood-thinners  Do not eat or drink fluids (other than water) for 6 hours before your procedure  No water for 2 hours before your procedure  Take your blood pressure medicine with a sip of water  Arrive 30 minutes before your appointment  If sedation is planned, bring suitable driver. Pennie Banter, Benedetto Goad, & public transportation are NOT APPROVED)  Carefully read the "Preparing for your procedure" detailed instructions  If you have questions call us at [phone]  Procedure appointments are for procedures only. NO medication refills or new problem evaluations.   ______________________________________________________________________

## 2023-09-18 NOTE — Progress Notes (Signed)
 Safety precautions to be maintained throughout the outpatient stay will include: orient to surroundings, keep bed in low position, maintain call bell within reach at all times, provide assistance with transfer out of bed and ambulation.

## 2023-10-02 ENCOUNTER — Telehealth: Payer: Self-pay | Admitting: *Deleted

## 2023-10-02 NOTE — Telephone Encounter (Signed)
 Spoke to patient regarding instructions for SCS trial. NPO after midnight. Have a driver that can stay during procedure and drive home. . Will be here 2-3 hours. Take blood pressure medication with sip of water morning of procedure. Wash back with hibiclens night before and morning of.

## 2023-10-29 ENCOUNTER — Encounter: Payer: Self-pay | Admitting: Student in an Organized Health Care Education/Training Program

## 2023-10-29 ENCOUNTER — Ambulatory Visit
Attending: Student in an Organized Health Care Education/Training Program | Admitting: Student in an Organized Health Care Education/Training Program

## 2023-10-29 DIAGNOSIS — M961 Postlaminectomy syndrome, not elsewhere classified: Secondary | ICD-10-CM | POA: Diagnosis present

## 2023-10-29 DIAGNOSIS — M5416 Radiculopathy, lumbar region: Secondary | ICD-10-CM | POA: Insufficient documentation

## 2023-10-29 DIAGNOSIS — G894 Chronic pain syndrome: Secondary | ICD-10-CM | POA: Diagnosis present

## 2023-10-29 DIAGNOSIS — G8929 Other chronic pain: Secondary | ICD-10-CM

## 2023-10-29 NOTE — Progress Notes (Signed)
 Safety precautions to be maintained throughout the outpatient stay will include: orient to surroundings, keep bed in low position, maintain call bell within reach at all times, provide assistance with transfer out of bed and ambulation.   Procedure reshceduled per Dr. Lateef

## 2023-10-29 NOTE — Progress Notes (Signed)
 The patient was scheduled for a spinal cord stimulator (SCS) trial during today's visit. However, they report a recent visit to the emergency department yesterday due to abdominal pain and urinary symptoms. Evaluation revealed a diagnosis of diverticulitis and urinary tract infection (UTI). The patient was started on a course of oral antibiotics and is currently undergoing treatment.  Reviewed lab work, unremarkable  Assessment:  Diverticulitis and UTI - currently under treatment with antibiotics.  SCS Trial - scheduled for today but deferred due to active infection and ongoing antibiotic therapy.  Plan:  Reschedule SCS trial to allow for full resolution of infection and completion of antibiotic course, patient has 10-day antibiotic course.  Will plan for SCS trial thereafter.  Patient instructed to notify clinic if symptoms worsen or persist beyond expected duration

## 2023-11-06 ENCOUNTER — Encounter (INDEPENDENT_AMBULATORY_CARE_PROVIDER_SITE_OTHER): Payer: Self-pay

## 2023-11-14 ENCOUNTER — Ambulatory Visit: Admitting: Student in an Organized Health Care Education/Training Program

## 2023-11-23 ENCOUNTER — Telehealth: Payer: Self-pay | Admitting: Student in an Organized Health Care Education/Training Program

## 2023-11-23 NOTE — Telephone Encounter (Signed)
 Patient wants to try to get SCS Trial scheduled. She had to cancel the last 2 times. Please call

## 2023-12-04 ENCOUNTER — Telehealth: Payer: Self-pay | Admitting: *Deleted

## 2023-12-04 NOTE — Telephone Encounter (Signed)
 Spoke to patient regarding request to reschedule SCS trial. She is on antibiotics at present and will call when she is clear to reschedule.

## 2023-12-07 ENCOUNTER — Encounter: Payer: Self-pay | Admitting: *Deleted

## 2023-12-18 ENCOUNTER — Other Ambulatory Visit: Payer: Self-pay | Admitting: Medical Genetics

## 2023-12-19 ENCOUNTER — Other Ambulatory Visit

## 2023-12-24 ENCOUNTER — Ambulatory Visit
Admission: RE | Admit: 2023-12-24 | Discharge: 2023-12-24 | Disposition: A | Discharge status: Home / Self Care | Attending: Gastroenterology | Admitting: Gastroenterology

## 2023-12-24 ENCOUNTER — Encounter
Admission: RE | Disposition: A | Discharge status: Home / Self Care | Payer: Self-pay | Source: Home / Self Care | Attending: Gastroenterology

## 2023-12-24 ENCOUNTER — Ambulatory Visit: Admitting: Certified Registered"

## 2023-12-24 DIAGNOSIS — E669 Obesity, unspecified: Secondary | ICD-10-CM | POA: Insufficient documentation

## 2023-12-24 DIAGNOSIS — I10 Essential (primary) hypertension: Secondary | ICD-10-CM | POA: Diagnosis not present

## 2023-12-24 DIAGNOSIS — D122 Benign neoplasm of ascending colon: Secondary | ICD-10-CM | POA: Insufficient documentation

## 2023-12-24 DIAGNOSIS — E039 Hypothyroidism, unspecified: Secondary | ICD-10-CM | POA: Diagnosis not present

## 2023-12-24 DIAGNOSIS — Z9049 Acquired absence of other specified parts of digestive tract: Secondary | ICD-10-CM | POA: Diagnosis not present

## 2023-12-24 DIAGNOSIS — K5730 Diverticulosis of large intestine without perforation or abscess without bleeding: Secondary | ICD-10-CM | POA: Diagnosis not present

## 2023-12-24 DIAGNOSIS — Z6835 Body mass index (BMI) 35.0-35.9, adult: Secondary | ICD-10-CM | POA: Insufficient documentation

## 2023-12-24 DIAGNOSIS — R933 Abnormal findings on diagnostic imaging of other parts of digestive tract: Secondary | ICD-10-CM | POA: Insufficient documentation

## 2023-12-24 DIAGNOSIS — G4733 Obstructive sleep apnea (adult) (pediatric): Secondary | ICD-10-CM | POA: Diagnosis not present

## 2023-12-24 HISTORY — PX: POLYPECTOMY: SHX149

## 2023-12-24 HISTORY — PX: COLONOSCOPY: SHX5424

## 2023-12-24 SURGERY — COLONOSCOPY
Anesthesia: General

## 2023-12-24 MED ORDER — PROPOFOL 10 MG/ML IV BOLUS
INTRAVENOUS | Status: AC
  Filled 2023-12-24: qty 20

## 2023-12-24 MED ORDER — PROPOFOL 10 MG/ML IV BOLUS
INTRAVENOUS | Status: DC | PRN
  Administered 2023-12-24: 30 mg via INTRAVENOUS
  Administered 2023-12-24: 70 mg via INTRAVENOUS
  Administered 2023-12-24: 50 mg via INTRAVENOUS
  Administered 2023-12-24: 20 mg via INTRAVENOUS
  Administered 2023-12-24: 190 mg via INTRAVENOUS

## 2023-12-24 MED ORDER — PROPOFOL 500 MG/50ML IV EMUL
INTRAVENOUS | Status: DC | PRN
Start: 2023-12-24 — End: 2023-12-24
  Administered 2023-12-24: 140 ug/kg/min via INTRAVENOUS

## 2023-12-24 MED ORDER — SODIUM CHLORIDE 0.9 % IV SOLN: INTRAVENOUS | Status: DC

## 2023-12-24 NOTE — Interval H&P Note (Signed)
 History and Physical Interval Note:  12/24/2023 12:34 PM  Karen [name]  has presented today for surgery, with the diagnosis of abnormal abdominal CT scan,diverticulitis.  The various methods of treatment have been discussed with the patient and family. After consideration of risks, benefits and other options for treatment, the patient has consented to  Procedure(s): COLONOSCOPY (N/A) as a surgical intervention.  The patient's history has been reviewed, patient examined, no change in status, stable for surgery.  I have reviewed the patient's chart and labs.  Questions were answered to the patient's satisfaction.     Karen [name]  Ok to proceed with colonoscopy

## 2023-12-24 NOTE — Op Note (Signed)
 [facility] Patient Name: Karen [name] Procedure Date: 12/24/2023 12:43 PM MRN: [id number] Account #: [id number] Date of Birth: Dec 05, 1950 Admit Type: Outpatient Age: 73 Room: [facility] ENDO ROOM 3 Gender: Female Note Status: Finalized Instrument Name: [id number] Procedure:             Colonoscopy Indications:           Abnormal CT of the GI tract Providers:             Ole Schick MD, MD Medicines:             Monitored Anesthesia Care Complications:         No immediate complications. Estimated blood loss:                         Minimal. Procedure:             Pre-Anesthesia Assessment:                        - Prior to the procedure, a History and Physical was                         performed, and patient medications and allergies were                         reviewed. The patient is competent. The risks and                         benefits of the procedure and the sedation options and                         risks were discussed with the patient. All questions                         were answered and informed consent was obtained.                         Patient identification and proposed procedure were                         verified by the physician, the nurse, the                         anesthesiologist, the anesthetist and the technician                         in the endoscopy suite. Mental Status Examination:                         alert and oriented. Airway Examination: normal                         oropharyngeal airway and neck mobility. Respiratory                         Examination: clear to auscultation. CV Examination:                         normal. Prophylactic Antibiotics: The patient does not  require prophylactic antibiotics. Prior                         Anticoagulants: The patient has taken no anticoagulant                         or antiplatelet agents. ASA Grade Assessment: III -  A                         patient with severe systemic disease. After reviewing                         the risks and benefits, the patient was deemed in                         satisfactory condition to undergo the procedure. The                         anesthesia plan was to use monitored anesthesia care                         (MAC). Immediately prior to administration of                         medications, the patient was re-assessed for adequacy                         to receive sedatives. The heart rate, respiratory                         rate, oxygen saturations, blood pressure, adequacy of                         pulmonary ventilation, and response to care were                         monitored throughout the procedure. The physical                         status of the patient was re-assessed after the                         procedure.                        After obtaining informed consent, the colonoscope was                         passed under direct vision. Throughout the procedure,                         the patient's blood pressure, pulse, and oxygen                         saturations were monitored continuously. The                         Colonoscope was introduced through the anus and  advanced to the the cecum, identified by appendiceal                         orifice and ileocecal valve. The colonoscopy was                         technically difficult and complex due to ineffective                         sedation. The patient tolerated the procedure well.                         The quality of the bowel preparation was adequate to                         identify polyps. The ileocecal valve, appendiceal                         orifice, and rectum were photographed. Findings:      The perianal and digital rectal examinations were normal.      A 3 mm polyp was found in the ascending colon. The polyp was sessile.       The polyp was removed  with a cold snare. Resection and retrieval were       complete. Estimated blood loss was minimal.      Multiple small-mouthed diverticula were found in the sigmoid colon.      Retroflexion in the rectum was not performed due to no IV access and       ineffective sedation. Impression:            - One 3 mm polyp in the ascending colon, removed with                         a cold snare. Resected and retrieved.                        - Diverticulosis in the sigmoid colon. Recommendation:        - Discharge patient to home.                        - Resume previous diet.                        - Continue present medications.                        - Await pathology results.                        - Repeat colonoscopy in 3 - 5 years for surveillance.                        - Return to referring physician as previously                         scheduled. Procedure Code(s):     --- Professional ---                        [phone], Colonoscopy, flexible; with removal of  tumor(s), polyp(s), or other lesion(s) by snare                         technique Diagnosis Code(s):     --- Professional ---                        D12.2, Benign neoplasm of ascending colon                        K57.30, Diverticulosis of large intestine without                         perforation or abscess without bleeding                        R93.3, Abnormal findings on diagnostic imaging of                         other parts of digestive tract CPT copyright 2022 American Medical Association. All rights reserved. The codes documented in this report are preliminary and upon coder review may  be revised to meet current compliance requirements. Ole Schick MD, MD 12/24/2023 1:25:11 PM Number of Addenda: 0 Note Initiated On: 12/24/2023 12:43 PM Scope Withdrawal Time: 0 hours 8 minutes 48 seconds  Total Procedure Duration: 0 hours 27 minutes 38 seconds  Estimated Blood Loss:  Estimated blood loss was  minimal.      [facility]

## 2023-12-24 NOTE — Transfer of Care (Signed)
 Immediate Anesthesia Transfer of Care Note  Patient: Karen [name]  Procedure(s) Performed: COLONOSCOPY POLYPECTOMY, INTESTINE  Patient Location: PACU  Anesthesia Type:General  Level of Consciousness: awake, alert , and oriented  Airway & Oxygen Therapy: Patient Spontanous Breathing  Post-op Assessment: Report given to RN and Post -op Vital signs reviewed and stable  Post vital signs: Reviewed and stable  Last Vitals:  Vitals Value Taken Time  BP 105/76 12/24/23 13:26  Temp 36.8 C 12/24/23 13:26  Pulse 84 12/24/23 13:26  Resp 16 12/24/23 13:26  SpO2 94 % 12/24/23 13:26    Last Pain:  Vitals:   12/24/23 1326  TempSrc: Temporal  PainSc: Asleep         Complications: No notable events documented.

## 2023-12-24 NOTE — H&P (Signed)
 Outpatient short stay form Pre-procedure 12/24/2023  Karen ONEIDA Schick, Karen [name]  Primary Physician: Claudia Charleston, Karen [name]  Reason for visit:  Complicated diverticulitis  History of present illness:    73 y/o lady with history of hypothyroidism, obesity, and OSA here for colonoscopy due to recent history of complicated diverticulitis. Last colonoscopy in 2020 with diverticulosis. She is pain free currently. No blood thinners. No family history of GI malignancies. History of cholecystectomy and appendectomy.    Current Facility-Administered Medications:    0.9 %  sodium chloride  infusion, , Intravenous, Continuous, Gavriel Holzhauer, Karen ONEIDA, Karen [name], Last Rate: 20 mL/hr at 12/24/23 1226, New Bag at 12/24/23 1226  Medications Prior to Admission  Medication Sig Dispense Refill Last Dose/Taking   levothyroxine (SYNTHROID, LEVOTHROID) 75 MCG tablet Take 75 mcg by mouth daily before breakfast.   12/24/2023 at  5:00 AM   lisinopril (PRINIVIL,ZESTRIL) 20 MG tablet Take 20 mg by mouth every morning.  3 12/24/2023 at  5:00 AM   pramipexole (MIRAPEX) 1 MG tablet Take by mouth.   12/23/2023   pregabalin (LYRICA) 75 MG capsule Take 75 mg by mouth 3 (three) times daily.   12/23/2023   B Complex-C (B-COMPLEX WITH VITAMIN C) tablet Take 1 tablet by mouth every evening. (Patient not taking: Reported on 12/24/2023)   Not Taking   torsemide (DEMADEX) 20 MG tablet Take 20 mg by mouth daily. (Patient not taking: Reported on 09/18/2023)        No Active Allergies   Past Medical History:  Diagnosis Date   Acquired trigger finger of both middle fingers    Arthritis    Breast cancer, right (HCC) 2011   Right Lumpectomy and Rad tx's.   Depression    DVT (deep venous thrombosis) (HCC)    leg   GERD (gastroesophageal reflux disease)    Heart murmur    Heart palpitations    Hyperlipidemia    Hypertension    Hypothyroidism    Leg edema    Narcolepsy and cataplexy    Neuropathy    Obesity    Osteoporosis, post-menopausal     Pre-diabetes    RLS (restless legs syndrome)    Sleep apnea    not using cpap machine-feels like she is drowning    Review of systems:  Otherwise negative.    Physical Exam  Gen: Alert, oriented. Appears stated age.  HEENT: PERRLA. Lungs: No respiratory distress CV: RRR Abd: soft, benign, no masses Ext: No edema    Planned procedures: Proceed with colonoscopy. The patient understands the nature of the planned procedure, indications, risks, alternatives and potential complications including but not limited to bleeding, infection, perforation, damage to internal organs and possible oversedation/side effects from anesthesia. The patient agrees and gives consent to proceed.  Please refer to procedure notes for findings, recommendations and patient disposition/instructions.     Karen ONEIDA Schick, Karen [name] [facility]

## 2023-12-24 NOTE — Anesthesia Preprocedure Evaluation (Signed)
 Anesthesia Evaluation  Patient identified by MRN, date of birth, ID band Patient awake    Reviewed: Allergy & Precautions, NPO status , Patient's Chart, lab work & pertinent test results  History of Anesthesia Complications Negative for: history of anesthetic complications  Airway Mallampati: III  TM Distance: <3 FB Neck ROM: full    Dental  (+) Chipped   Pulmonary neg shortness of breath, sleep apnea    Pulmonary exam normal        Cardiovascular Exercise Tolerance: Good hypertension, (-) angina Normal cardiovascular exam     Neuro/Psych  Headaches PSYCHIATRIC DISORDERS       Neuromuscular disease    GI/Hepatic Neg liver ROS,GERD  Controlled,,  Endo/Other  Hypothyroidism    Renal/GU negative Renal ROS  negative genitourinary   Musculoskeletal   Abdominal   Peds  Hematology negative hematology ROS (+)   Anesthesia Other Findings Past Medical History: No date: Acquired trigger finger of both middle fingers No date: Arthritis 2011: Breast cancer, right (HCC)     Comment:  Right Lumpectomy and Rad tx's. No date: Depression No date: DVT (deep venous thrombosis) (HCC)     Comment:  leg No date: GERD (gastroesophageal reflux disease) No date: Heart murmur No date: Heart palpitations No date: Hyperlipidemia No date: Hypertension No date: Hypothyroidism No date: Leg edema No date: Narcolepsy and cataplexy No date: Neuropathy No date: Obesity No date: Osteoporosis, post-menopausal No date: Pre-diabetes No date: RLS (restless legs syndrome) No date: Sleep apnea     Comment:  not using cpap machine-feels like she is drowning  Past Surgical History: No date: ABDOMINAL HYSTERECTOMY No date: APPENDECTOMY No date: BREAST SURGERY 11/25/2014: CARDIAC CATHETERIZATION; N/A     Comment:  Procedure: Left Heart Cath;  Surgeon: Vinie DELENA Jude,               MD;  Location: [facility] INVASIVE CV LAB;  Service:                Cardiovascular;  Laterality: N/A; 05/08/2023: CARPAL TUNNEL RELEASE; Right     Comment:  Procedure: RIGHT CARPAL TUNNEL RELEASE WITH ULTRASOUND               GUIDANCE;  Surgeon: Claudene Penne ORN, MD;  Location: [facility]              ORS;  Service: Neurosurgery;  Laterality: Right; No date: CHOLECYSTECTOMY 08/05/2018: COLONOSCOPY WITH PROPOFOL ; N/A     Comment:  Procedure: COLONOSCOPY WITH PROPOFOL ;  Surgeon:               Gaylyn Gladis PENNER, MD;  Location: [facility] ENDOSCOPY;                Service: Endoscopy;  Laterality: N/A; 10/23/2022: FORAMINOTOMY 1 LEVEL; Right     Comment:  Procedure: RIGHT L4-5 LAMINOFORAMINOTOMY;  Surgeon:               Clois Fret, MD;  Location: [facility] ORS;  Service:               Neurosurgery;  Laterality: Right; No date: MASTECTOMY; Right     Comment:  partial/lumpectomy  BMI    Body Mass Index: 35.40 kg/m      Reproductive/Obstetrics negative OB ROS                              Anesthesia Physical Anesthesia Plan  ASA: 3  Anesthesia Plan: General  Post-op Pain Management:    Induction: Intravenous  PONV Risk Score and Plan: Propofol  infusion and TIVA  Airway Management Planned: Natural Airway and Nasal Cannula  Additional Equipment:   Intra-op Plan:   Post-operative Plan:   Informed Consent: I have reviewed the patients History and Physical, chart, labs and discussed the procedure including the risks, benefits and alternatives for the proposed anesthesia with the patient or authorized representative who has indicated his/her understanding and acceptance.     Dental Advisory Given  Plan Discussed with: Anesthesiologist, CRNA and Surgeon  Anesthesia Plan Comments: (Patient consented for risks of anesthesia including but not limited to:  - adverse reactions to medications - risk of airway placement if required - damage to eyes, teeth, lips or other oral mucosa - nerve damage due to positioning  - sore throat or  hoarseness - Damage to heart, brain, nerves, lungs, other parts of body or loss of life  Patient voiced understanding and assent.)        Anesthesia Quick Evaluation

## 2023-12-25 LAB — SURGICAL PATHOLOGY

## 2023-12-31 NOTE — Anesthesia Postprocedure Evaluation (Signed)
 Anesthesia Post Note  Patient: Karen [name]  Procedure(s) Performed: COLONOSCOPY POLYPECTOMY, INTESTINE  Patient location during evaluation: PACU Anesthesia Type: General Level of consciousness: awake and alert Pain management: pain level controlled Vital Signs Assessment: post-procedure vital signs reviewed and stable Respiratory status: spontaneous breathing, nonlabored ventilation, respiratory function stable and patient connected to nasal cannula oxygen Cardiovascular status: blood pressure returned to baseline and stable Postop Assessment: no apparent nausea or vomiting Anesthetic complications: no   No notable events documented.   Last Vitals:  Vitals:   12/24/23 1336 12/24/23 1346  BP: 122/73 121/88  Pulse: 80 95  Resp: 20 (!) 189  Temp:    SpO2: 98% 97%    Last Pain:  Vitals:   12/24/23 1346  TempSrc:   PainSc: 0-No pain                 Lynwood KANDICE Clause

## 2024-01-10 ENCOUNTER — Telehealth: Payer: Self-pay | Admitting: Student in an Organized Health Care Education/Training Program

## 2024-01-10 NOTE — Telephone Encounter (Signed)
 PT called stated that she now has her health condition under control and will like to move forward with the SCS Trial.PT had to R/S last appt. Please give patient a call. TY

## 2024-01-28 ENCOUNTER — Encounter: Payer: Self-pay | Admitting: Neurosurgery

## 2024-01-28 NOTE — Telephone Encounter (Signed)
 I had to resend a request for prior auth. Pending review

## 2024-01-29 ENCOUNTER — Encounter (INDEPENDENT_AMBULATORY_CARE_PROVIDER_SITE_OTHER): Payer: Self-pay | Admitting: Nurse Practitioner

## 2024-01-29 ENCOUNTER — Ambulatory Visit (INDEPENDENT_AMBULATORY_CARE_PROVIDER_SITE_OTHER): Payer: Medicare Other | Admitting: Nurse Practitioner

## 2024-01-29 ENCOUNTER — Telehealth: Payer: Self-pay | Admitting: Student in an Organized Health Care Education/Training Program

## 2024-01-29 VITALS — BP 129/86 | HR 89 | Ht 68.0 in | Wt 237.0 lb

## 2024-01-29 DIAGNOSIS — M79605 Pain in left leg: Secondary | ICD-10-CM | POA: Diagnosis not present

## 2024-01-29 DIAGNOSIS — M79604 Pain in right leg: Secondary | ICD-10-CM | POA: Diagnosis not present

## 2024-01-29 DIAGNOSIS — M48062 Spinal stenosis, lumbar region with neurogenic claudication: Secondary | ICD-10-CM | POA: Diagnosis not present

## 2024-01-29 DIAGNOSIS — G629 Polyneuropathy, unspecified: Secondary | ICD-10-CM | POA: Diagnosis not present

## 2024-01-29 NOTE — Telephone Encounter (Signed)
 Patient is well enough to move forward with SCS Trial. Unfortunately the authorization ran out 01-08-24. Please get authorization and reschedule this patient for her SCS Trial. I let her know you would call with appt.

## 2024-01-29 NOTE — Progress Notes (Signed)
 Subjective:    Patient ID: Karen [name], female    DOB: 1950-10-17, 73 y.o.   MRN: [id number] Chief Complaint  Patient presents with   Follow-up    6 months pt has notice lumps on legs , she stated they have been there for a while, she stated that she is having aching in legs , notice some discoloring in the on the right leg    Karen [name] is a 73 year old female who returns today in regards for concern for venous insufficiency and lower extremity pain.  She has a known history of spinal stenosis and she had plans to have a spinal stimulator placed.  Unfortunately her right before this was supposed to be scheduled she had a UTI which stopped her procedure.  Unfortunately she had a subsequent UTI which became significant and she required hospitalization for.  She recently has returned from vacation also treatment of another UTI and is hopeful that now that everything has been cleared she may be able to move forward with a spinal stimulator.   She endorses having pain when she sits for extended periods but the pain worsens when she lays down in bed.  She notes that when she is in her recliner elevating sometimes her feet become numb she is unable to feel them.  She is also a history of small fiber neuropathy as well.  Currently there are no open wounds or ulcerations but there is evidence of stasis dermatitis on the right lower extremity and the left lower extremity is notably cooler than the right.  Minimal edema present bilaterally  Previously, noninvasive studies show an ABI of 1.34 on the right and 1.20 on the left.  She has strong primarily triphasic waveforms in the bilateral tibial vessels with good toe waveforms bilaterally.  She has normal toe pressures bilaterally.  No evidence of DVT or superficial thrombophlebitis bilaterally.  No evidence of deep venous insufficiency bilaterally.  She does have evidence of venous reflux in the bilateral great saphenous veins with the right  being greater than the left     Review of Systems  Cardiovascular:  Positive for leg swelling.  Musculoskeletal:  Positive for arthralgias and back pain.  Skin:  Positive for color change.  All other systems reviewed and are negative.      Objective:   Physical Exam Vitals reviewed.  HENT:     Head: Normocephalic.  Cardiovascular:     Rate and Rhythm: Normal rate.     Pulses:          Dorsalis pedis pulses are detected w/ Doppler on the right side and detected w/ Doppler on the left side.       Posterior tibial pulses are detected w/ Doppler on the right side and detected w/ Doppler on the left side.  Pulmonary:     Effort: Pulmonary effort is normal.  Musculoskeletal:     Right lower leg: Edema present.     Left lower leg: Edema present.  Neurological:     Mental Status: She is alert and oriented to person, place, and time.  Psychiatric:        Mood and Affect: Mood normal.        Behavior: Behavior normal.        Thought Content: Thought content normal.        Judgment: Judgment normal.     BP 129/86   Pulse 89   Ht 5' 8 (1.727 m)   Wt 237 lb (107.5 kg)  BMI 36.04 kg/m   Past Medical History:  Diagnosis Date   Acquired trigger finger of both middle fingers    Arthritis    Breast cancer, right (HCC) 2011   Right Lumpectomy and Rad tx's.   Depression    DVT (deep venous thrombosis) (HCC)    leg   GERD (gastroesophageal reflux disease)    Heart murmur    Heart palpitations    Hyperlipidemia    Hypertension    Hypothyroidism    Leg edema    Narcolepsy and cataplexy    Neuropathy    Obesity    Osteoporosis, post-menopausal    Pre-diabetes    RLS (restless legs syndrome)    Sleep apnea    not using cpap machine-feels like she is drowning    Social History   Socioeconomic History   Marital status: Married    Spouse name: Not on file   Number of children: Not on file   Years of education: Not on file   Highest education level: Not on file   Occupational History   Not on file  Tobacco Use   Smoking status: Never   Smokeless tobacco: Never  Vaping Use   Vaping status: Never Used  Substance and Sexual Activity   Alcohol use: Yes    Alcohol/week: 5.0 standard drinks of alcohol    Types: 5 Shots of liquor per week   Drug use: No   Sexual activity: Not on file  Other Topics Concern   Not on file  Social History Narrative   Not on file   Social Drivers of Health   Financial Resource Strain: Low Risk  (04/23/2023)   Received from [facility]   Overall Financial Resource Strain (CARDIA)    Difficulty of Paying Living Expenses: Not hard at all  Food Insecurity: No Food Insecurity (04/23/2023)   Received from [facility]   Hunger Vital Sign    Within the past 12 months, you worried that your food would run out before you got the money to buy more.: Never true    Within the past 12 months, the food you bought just didn't last and you didn't have money to get more.: Never true  Transportation Needs: No Transportation Needs (04/23/2023)   Received from [facility]    In the past 12 months, has lack of transportation kept you from medical appointments or from getting medications?: No    Lack of Transportation (Non-Medical): No  Physical Activity: Not on file  Stress: Not on file  Social Connections: Not on file  Intimate Partner Violence: Not on file    Past Surgical History:  Procedure Laterality Date   ABDOMINAL HYSTERECTOMY     APPENDECTOMY     BREAST SURGERY     CARDIAC CATHETERIZATION N/A 11/25/2014   Procedure: Left Heart Cath;  Surgeon: Vinie DELENA Jude, MD;  Location: [facility] INVASIVE CV LAB;  Service: Cardiovascular;  Laterality: N/A;   CARPAL TUNNEL RELEASE Right 05/08/2023   Procedure: RIGHT CARPAL TUNNEL RELEASE WITH ULTRASOUND GUIDANCE;  Surgeon: Claudene Penne ORN, MD;  Location: [facility] ORS;  Service: Neurosurgery;  Laterality: Right;    CHOLECYSTECTOMY     COLONOSCOPY N/A 12/24/2023   Procedure: COLONOSCOPY;  Surgeon: Maryruth Ole DASEN, MD;  Location: [facility] ENDOSCOPY;  Service: Endoscopy;  Laterality: N/A;   COLONOSCOPY WITH PROPOFOL  N/A 08/05/2018   Procedure: COLONOSCOPY WITH PROPOFOL ;  Surgeon: Gaylyn Gladis PENNER, MD;  Location: [facility] ENDOSCOPY;  Service: Endoscopy;  Laterality: N/A;   FORAMINOTOMY 1 LEVEL Right 10/23/2022   Procedure: RIGHT L4-5 LAMINOFORAMINOTOMY;  Surgeon: Clois Fret, MD;  Location: [facility] ORS;  Service: Neurosurgery;  Laterality: Right;   MASTECTOMY Right    partial/lumpectomy   POLYPECTOMY  12/24/2023   Procedure: POLYPECTOMY, INTESTINE;  Surgeon: Maryruth Ole DASEN, MD;  Location: [facility] ENDOSCOPY;  Service: Endoscopy;;    Family History  Problem Relation Age of Onset   Cancer Mother    Hypertension Mother    Hypertension Father    Heart disease Father    Emphysema Father    Depression Father    Cancer Sister    Hypertension Sister     No Active Allergies     Latest Ref Rng & Units 02/05/2015    4:03 PM  CBC  WBC 3.6 - 11.0 K/uL 8.2   Hemoglobin 12.0 - 16.0 g/dL 86.8   Hematocrit 64.9 - 47.0 % 40.2   Platelets 150 - 440 K/uL 184       CMP     Component Value Date/Time   NA 136 10/06/2022 1150   K 3.6 10/06/2022 1150   CL 100 10/06/2022 1150   CO2 29 10/06/2022 1150   GLUCOSE 93 10/06/2022 1150   BUN 11 10/06/2022 1150   CREATININE 0.57 10/06/2022 1150   CALCIUM 9.1 10/06/2022 1150   GFRNONAA >60 10/06/2022 1150     No results found.     Assessment & Plan:   1. Leg pain, bilateral (Primary) Based on the patient's results I suspect that some of her pain is multifactorial but the larger component is related to neuropathic pain from her lower back.  She notes that her biggest complaint is the numbness and tingling discomfort that she has.  She does have notable issues with her veins but this is more likely to contribute to her swelling.  I discussed with the patient that  she should continue with conservative therapy including use of medical grade compression and elevation.  We also discussed endovenous laser ablation of her varicosities but given that her symptoms are more so consistent with her lower back issues, she we will move forward with the spinal stimulator that was previously discussed with neurosurgery.  Once she has had the spinal stimulator placed we will have her return in order to evaluate her lower extremity symptoms and pain to determine if endovenous laser ablation may be helpful for her.  Will have her return in 3 months.  2. Neuropathy This may also be component of the patient's ongoing discomfort  3. Neurogenic claudication due to lumbar spinal stenosis Following the patient's noninvasive studies, the majority of her pain is likely resulting from her underlying back issues.  She is recommended to move forward with a spinal stimulator as discussed by neurosurgery.  We will have her follow-up if she continues to have pain or issues post spinal stimulator.   Current Outpatient Medications on File Prior to Visit  Medication Sig Dispense Refill   levothyroxine (SYNTHROID, LEVOTHROID) 75 MCG tablet Take 75 mcg by mouth daily before breakfast.     lisinopril (PRINIVIL,ZESTRIL) 20 MG tablet Take 20 mg by mouth every morning.  3   pramipexole (MIRAPEX) 1 MG tablet Take by mouth.     pregabalin (LYRICA) 75 MG capsule Take 75 mg by mouth 3 (three) times daily.     B Complex-C (B-COMPLEX WITH VITAMIN C) tablet Take 1 tablet by mouth every evening. (Patient not taking: Reported on 12/24/2023)  No current facility-administered medications on file prior to visit.    There are no Patient Instructions on file for this visit. No follow-ups on file.   Marwin Primmer E Valyn Latchford, NP

## 2024-03-03 ENCOUNTER — Ambulatory Visit: Admitting: Student in an Organized Health Care Education/Training Program

## 2024-03-03 ENCOUNTER — Ambulatory Visit
Admission: RE | Admit: 2024-03-03 | Discharge: 2024-03-03 | Disposition: A | Discharge status: Home / Self Care | Source: Ambulatory Visit | Attending: Student in an Organized Health Care Education/Training Program | Admitting: Student in an Organized Health Care Education/Training Program

## 2024-03-03 ENCOUNTER — Encounter: Payer: Self-pay | Admitting: Student in an Organized Health Care Education/Training Program

## 2024-03-03 VITALS — BP 102/87 | HR 72 | Temp 97.7°F | Resp 16 | Ht 68.0 in | Wt 240.0 lb

## 2024-03-03 DIAGNOSIS — G894 Chronic pain syndrome: Secondary | ICD-10-CM | POA: Diagnosis present

## 2024-03-03 DIAGNOSIS — M961 Postlaminectomy syndrome, not elsewhere classified: Secondary | ICD-10-CM | POA: Diagnosis present

## 2024-03-03 DIAGNOSIS — G8929 Other chronic pain: Secondary | ICD-10-CM

## 2024-03-03 DIAGNOSIS — M5416 Radiculopathy, lumbar region: Secondary | ICD-10-CM | POA: Diagnosis present

## 2024-03-03 MED ORDER — LACTATED RINGERS IV SOLN: Freq: Once | INTRAVENOUS | Status: AC

## 2024-03-03 MED ORDER — CEFAZOLIN SODIUM 1 G IJ SOLR
INTRAMUSCULAR | Status: AC
  Filled 2024-03-03: qty 20

## 2024-03-03 MED ORDER — MIDAZOLAM HCL 5 MG/5ML IJ SOLN
INTRAMUSCULAR | Status: AC
  Filled 2024-03-03: qty 5

## 2024-03-03 MED ORDER — MIDAZOLAM HCL 5 MG/5ML IJ SOLN
0.5000 mg | Freq: Once | INTRAMUSCULAR | Status: AC
  Administered 2024-03-03: 2 mg via INTRAVENOUS

## 2024-03-03 MED ORDER — CEFAZOLIN SODIUM-DEXTROSE 2-4 GM/100ML-% IV SOLN
2.0000 g | Freq: Once | INTRAVENOUS | Status: AC
  Administered 2024-03-03: 2 g via INTRAVENOUS

## 2024-03-03 MED ORDER — CEPHALEXIN 500 MG PO CAPS: 500.0000 mg | ORAL_CAPSULE | Freq: Four times a day (QID) | ORAL | 0 refills | Status: AC

## 2024-03-03 MED ORDER — ROPIVACAINE HCL 2 MG/ML IJ SOLN
9.0000 mL | Freq: Once | INTRAMUSCULAR | Status: AC
  Administered 2024-03-03: 9 mL via PERINEURAL

## 2024-03-03 MED ORDER — LIDOCAINE HCL 2 % IJ SOLN
INTRAMUSCULAR | Status: AC
  Filled 2024-03-03: qty 20

## 2024-03-03 MED ORDER — ROPIVACAINE HCL 2 MG/ML IJ SOLN
INTRAMUSCULAR | Status: AC
  Filled 2024-03-03: qty 20

## 2024-03-03 MED ORDER — LIDOCAINE HCL 2 % IJ SOLN
20.0000 mL | Freq: Once | INTRAMUSCULAR | Status: AC
  Administered 2024-03-03: 400 mg

## 2024-03-03 MED ORDER — FENTANYL CITRATE (PF) 100 MCG/2ML IJ SOLN
25.0000 ug | INTRAMUSCULAR | Status: DC | PRN
  Administered 2024-03-03: 75 ug via INTRAVENOUS

## 2024-03-03 MED ORDER — FENTANYL CITRATE (PF) 100 MCG/2ML IJ SOLN
INTRAMUSCULAR | Status: AC
  Filled 2024-03-03: qty 2

## 2024-03-03 NOTE — Patient Instructions (Signed)
Today we did the following -We have done a Spinal Cord Stimulator Trial with [employer]  -As long as the leads are in place, do not bathe or shower. You may sponge bathe.  -While the lead is in place, please limit the bending, lifting, or twisting because the lead can move.  -The things we want to see is if your pain improves (and by what percentage), if you can do more activity (don't overdo it), and if you can use less of your "as needed" medicine. Do not stop long acting medicines like methadone, oxycontin, MS Contin, etc without checking with [place].  -It is VERY important that you pick up the antibiotics we prescribed, Keflex, on your way home from the trial and take them as prescribed(4 times a day), starting today, for as long as the lead is in place.  -The Spina Cord Stimulator Representative will be in contact with you while the lead is in place to make sure the trial goes as well as possible.  -Please contact us with any questions or concerns at any time during the trial.   -If you start running a fever over 100 degrees, have severe back pain, or new pain running down the legs, or drainage coming from the lead site, contact us immediately and/or go to the emergency room.  -Please do not restart any sort of medication that can thin your blood such as Aspirin, ibuprofen, motrin, aleve, plavix, coumadin, etc. If you aren't sure, call and ask.  -We will have you return next Monday to have the lead removed. If this is successful, at that point we can go over the details about the permanent implant.

## 2024-03-03 NOTE — Progress Notes (Signed)
 PROVIDER NOTE: Interpretation of information contained herein should be left to medically-trained personnel. Specific [name] instructions are provided elsewhere under [name] Instructions section of medical record. This document was created in part using STT-dictation technology, any transcriptional errors that may result from this process are unintentional.  [name]: Karen [name] Type: Established DOB: Jan 04, 1951 MRN: [id number] PCP: Claudia Charleston, MD  Service: Procedure DOS: 03/03/2024 Setting: Ambulatory Location: Ambulatory outpatient facility Delivery: Face-to-face Provider: Wallie Sherry, MD Specialty: Interventional Pain Management Specialty designation: 09 Location: Outpatient facility Ref. Prov.: Claudia Charleston, MD       Interventional Therapy   Primary Reason for Admission: Surgical management of chronic pain condition.   Procedure:              Type: Trial Spinal Cord Neurostimulator Implant (Percutaneous, interlaminar, posterior epidural placement) Medtronic Laterality: Bilateral (-50)  Level: Lumbar  Imaging: Fluoroscopic guidance Anesthesia: Local anesthesia (1-2% Lidocaine ) Sedation: Moderate Sedation                       DOS: 03/03/2024  Performed by: Wallie Sherry, MD  Purpose: Diagnostic. To determine if a permanent implant may be effective in controlling some or all of Karen [name]'s chronic pain symptoms.  Rationale (medical necessity): procedure needed and proper for Karen diagnosis and/or treatment of Karen [name]'s medical symptoms and needs. 1. Lumbar post-laminectomy syndrome   2. Failed back surgical syndrome   3. Chronic radicular lumbar pain   4. Chronic pain syndrome    NAS-11 Pain score:   Pre-procedure: 8 /10   Post-procedure: 0-No pain/10     Target: Posterior epidural space over Karen dorsal columns of Karen spinal cord. Location: Posterior intraspinal canal Region: Thoracolumbar  Approach: Translaminar percutaneous  Type of  procedure: Surgical   Position / Prep / Materials:  Position: Prone  Prep solution: ChloraPrep (2% chlorhexidine  gluconate and 70% isopropyl alcohol) Prep Area: Entire Posterior Thoracolumbar Region  Materials:  Tray: Implant tray        Needle(s):  Type: Epidural  Gauge (G): 14  Length: 3.5 inch  Qty: 2  H&P (Pre-op Assessment):  Karen [name] is a 73 y.o. (year old), female [name], seen today for interventional treatment. She  has a past surgical history that includes Cardiac catheterization (N/A, 11/25/2014); Breast surgery; Cholecystectomy; Appendectomy; Abdominal hysterectomy; Mastectomy (Right); Colonoscopy with propofol  (N/A, 08/05/2018); Foraminotomy 1 level (Right, 10/23/2022); Carpal tunnel release (Right, 05/08/2023); Colonoscopy (N/A, 12/24/2023); and Polypectomy (12/24/2023).  Initial Vital Signs:  Pulse/EKG Rate: 72ECG Heart Rate: 75 (NSR) Temp: (!) 97.3 F (36.3 C) Resp: 16 BP: (!) 134/93 SpO2: 100 %  BMI: Estimated body mass index is 36.49 kg/m as calculated from Karen following:   Height as of this encounter: 5' 8 (1.727 m).   Weight as of this encounter: 240 lb (108.9 kg).  Risk Assessment: Allergies: Reviewed. She has no active allergies.  Allergy Precautions: None required Coagulopathies: Reviewed. None identified.  Blood-thinner therapy: None at this time Active Infection(s): Reviewed. None identified. Karen [name] is afebrile  Site Confirmation: Karen [name] was asked to confirm Karen procedure and laterality before marking Karen site, which she did. Procedure checklist: Completed Consent: Before Karen procedure and under Karen influence of no sedative(s), amnesic(s), or anxiolytics, Karen [name] was informed of Karen treatment options, risks and possible complications. To fulfill our ethical and legal obligations, as recommended by Karen [employer]'s Code of Ethics, I have informed Karen [name] of my clinical impression; Karen nature and purpose of Karen treatment  or  procedure; Karen risks, benefits, and possible complications of Karen intervention; Karen alternatives, including doing nothing; Karen risk(s) and benefit(s) of Karen alternative treatment(s) or procedure(s); and Karen risk(s) and benefit(s) of doing nothing.  Karen [name] was provided with information about Karen general risks and possible complications associated with most interventional procedures. These include, but are not limited to: failure to achieve desired goals, infection, bleeding, organ or nerve damage, allergic reactions, paralysis, and/or death.  In addition, she was informed of those risks and possible complications associated to this particular procedure, which include, but are not limited to: damage to Karen implant; failure to decrease pain; local, systemic, or serious CNS infections, intraspinal abscess with possible cord compression and paralysis, or life-threatening such as meningitis; intrathecal and/or epidural bleeding with formation of hematoma with possible spinal cord compression and permanent paralysis; organ damage; nerve injury or damage with subsequent sensory, motor, and/or autonomic system dysfunction, resulting in transient or permanent pain, numbness, and/or weakness of one or several areas of Karen body; allergic reactions, either minor or major life-threatening, such as anaphylactic or anaphylactoid reactions.  Furthermore, Karen [name] was informed of those risks and complications associated with Karen medications. These include, but are not limited to: allergic reactions (i.e.: anaphylactic or anaphylactoid reactions); arrhythmia;  Hypotension/hypertension; cardiovascular collapse; respiratory depression and/or shortness of breath; swelling or edema; medication-induced neural toxicity; particulate matter embolism and blood vessel occlusion with resultant organ, and/or nervous system infarction and permanent paralysis.  Finally, she was informed that Medicine is not an exact science;  therefore, there is also Karen possibility of unforeseen or unpredictable risks and/or possible complications that may result in a catastrophic outcome. Karen [name] indicated having understood very clearly. We have given Karen [name] no guarantees and we have made no promises. Enough time was given to Karen [name] to ask questions, all of which were answered to Karen [name]'s satisfaction. Karen [name] has indicated that she wanted to continue with Karen procedure. Attestation: I, Karen ordering provider, attest that I have discussed with Karen [name] Karen benefits, risks, side-effects, alternatives, likelihood of achieving goals, and potential problems during recovery for Karen procedure that I have provided informed consent. Date  Time: 03/03/2024  9:27 AM  Pre-Procedure Preparation:  Monitoring: As per clinic protocol. Respiration, ETCO2, SpO2, BP, heart rate and rhythm monitor placed and checked for adequate function Safety Precautions: [name] was assessed for positional comfort and pressure points before starting Karen procedure. Time-out: I initiated and conducted Karen Time-out before starting Karen procedure, as per protocol. Karen [name] was asked to participate by confirming Karen accuracy of Karen Time Out information. Verification of Karen correct person, site, and procedure were performed and confirmed by me, Karen nursing staff, and Karen [name]. Time-out conducted as per Joint Commission's Universal Protocol ([id number].01.01.01). Time: 1017 Start Time: 1017 hrs.  Description/Narrative of Procedure:          Rationale (medical necessity): procedure needed and proper for Karen diagnosis and/or treatment of Karen [name]'s medical symptoms and needs. Procedural Technique Safety Precautions: Aspiration looking for blood return was conducted prior to all injections. At no point did we inject any substances, as a needle was being advanced. No attempts were made at seeking any paresthesias. Safe injection practices and needle  disposal techniques used. Medications properly checked for expiration dates. SDV (single dose vial) medications used. Description of Karen Procedure: Protocol guidelines were followed. Karen [name] was assisted into a comfortable position. Karen target area was identified and Karen area prepped in Karen usual  manner. Skin & deeper tissues infiltrated with local anesthetic. Appropriate amount of time allowed to pass for local anesthetics to take effect. Karen procedure needles were then advanced to Karen target area. Proper needle placement secured. Negative aspiration confirmed. Solution injected in intermittent fashion, asking for systemic symptoms every 0.5cc of injectate. Karen needles were then removed and Karen area cleansed, making sure to leave some of Karen prepping solution back to take advantage of its long term bactericidal properties.  Technical description of procedure: Availability of a responsible, adult driver, and NPO status confirmed. Informed consent was obtained after having discussed risks and possible complications. An IV was started. Karen [name] was then taken to Karen fluoroscopy suite, where Karen [name] was placed in position for Karen procedure, over Karen fluoroscopy table. Karen [name] was then monitored in Karen usual manner. Fluoroscopy was manipulated to obtain Karen best possible view of Karen target. Parallex error was corrected before commencing Karen procedure. Once a clear view of Karen target had been obtained, Karen skin and deeper tissues over Karen procedure site were infiltrated using lidocaine , loaded in a 10 cc luer-loc syringe with a 0.5 inch, 25-G needle. Karen introducer needle(s) was/were then inserted through Karen skin and deeper tissues. A paramidline approach was used to enter Karen posterior epidural space at a 30 angle, using "Loss-of-resistance Technique" with 3 ml of PF-NaCl (0.9% NSS). Correct needle placement was confirmed in Karen antero-posterior and lateral fluoroscopic views. Karen lead was gently  introduced and manipulated under real-time fluoroscopy, constantly assessing for pain, discomfort, or paresthesias, until Karen tip rested at Karen desired level. Both sides were done in identical fashion. Electrode placement was tested until appropriate coverage was attained. Once Karen [name] confirmed that Karen stimulation was over Karen desired area, Karen lead(s) was/were secured in place and Karen introducer needles removed. This was done under real-time fluoroscopy while observing Karen electrode tip to avoid unintended migration. Karen area was covered with a non-occlusive dressing and Karen [name] transported to recovery for further programming.  Vitals:   03/03/24 1037 03/03/24 1045 03/03/24 1055 03/03/24 1106  BP: (!) 131/102  122/62 102/87  Pulse:      Resp: 17 13 17 16   Temp:  97.7 F (36.5 C)  97.7 F (36.5 C)  SpO2: 100% 99% 98% 98%  Weight:      Height:        Start Time: 1017 hrs. End Time: 1032 hrs.  Neurostimulator Details:  Lead(s):  Brand: Medtronic         Epidural Access Level:  T12-L1 T12-L1  Lead implant:  Bilateral   No. of Electrodes/Lead:  8 8  Laterality:  Left Right  Location (Top electrode):  T8 T9  Model No.: [id number] Same  Length: 60cm Same  Lot No.: [id number]   Imaging Guidance (Spinal):         Type of Imaging Technique: Fluoroscopy Guidance (Spinal) Indication(s): Fluoroscopy guidance for needle placement to enhance accuracy in procedures requiring precise needle localization for targeted delivery of medication in or near specific anatomical locations not easily accessible without such real-time imaging assistance. Exposure Time: Please see nurses notes. Contrast: None used. Fluoroscopic Guidance: I was personally present during Karen use of fluoroscopy. Tunnel Vision Technique used to obtain Karen best possible view of Karen target area. Parallax error corrected before commencing Karen procedure. Direction-depth-direction technique used to introduce Karen  needle under continuous pulsed fluoroscopy. Once target was reached, antero-posterior, oblique, and lateral fluoroscopic projection used confirm needle placement in all  planes. Images permanently stored in EMR. Interpretation: No contrast injected. I personally interpreted Karen imaging intraoperatively. Adequate needle placement confirmed in multiple planes. Permanent images saved into Karen [name]'s record.  Antibiotic Prophylaxis:   Anti-infectives (From admission, onward)    Start     Dose/Rate Route Frequency Ordered Stop   03/03/24 1000  ceFAZolin  (ANCEF ) IVPB 2g/100 mL premix        2 g 200 mL/hr over 30 Minutes Intravenous  Once 03/03/24 0954 03/03/24 1020   03/03/24 0000  cephALEXin  (KEFLEX ) 500 MG capsule        500 mg Oral 4 times daily 03/03/24 0944 03/10/24 2359      Indication(s): Implant Prophylaxis.  Post-operative Assessment:  Post-procedure Vital Signs:  Pulse/HCG Rate: 7267 Temp: 97.7 F (36.5 C) Resp: 16 BP: 102/87 SpO2: 98 %  Complications: No immediate post-treatment complications observed by team, or reported by [name].  Note: Karen [name] tolerated Karen entire procedure well. A repeat set of vitals were taken after Karen procedure and Karen [name] was kept under observation following institutional policy, for this type of procedure. Post-procedural neurological assessment was performed, showing return to baseline, prior to discharge. Karen [name] was provided with post-procedure discharge instructions, including a section on how to identify potential problems. Should any problems arise concerning this procedure, Karen [name] was given instructions to immediately contact us , at any time, without hesitation. In any case, we plan to contact Karen [name] by telephone for a follow-up status report regarding this interventional procedure.  Comments:  No additional relevant information.  Plan of Care Orders:  Orders Placed This Encounter  Procedures   DG PAIN CLIN[facility] C-ARM  1-60 MIN NO REPORT    Intraoperative interpretation by procedural physician at [facility].    Standing Status:   Standing    Number of Occurrences:   1    Reason for exam::   Assistance in needle guidance and placement for procedures requiring needle placement in or near specific anatomical locations not easily accessible without such assistance.    Medications administered: We administered lidocaine , lactated ringers , midazolam , fentaNYL , ropivacaine  (PF) 2 mg/mL (0.2%), and ceFAZolin .  See Karen medical record for exact dosing, route, and time of administration.    Follow-up plan:   Return in about 1 week (around 03/10/2024) for SCS lead pull.     Recent Visits No visits were found meeting these conditions. Showing recent visits within past 90 days and meeting all other requirements Today's Visits Date Type Provider Dept  03/03/24 Procedure visit Marcelino Nurse, MD [facility]  Showing today's visits and meeting all other requirements Future Appointments Date Type Provider Dept  03/10/24 Appointment Marcelino Nurse, MD [facility]  Showing future appointments within next 90 days and meeting all other requirements  Disposition: Discharge home  Discharge (Date  Time): 03/03/2024; 1117 hrs.   Primary Care Physician: Claudia Charleston, MD Location: [facility] Note by: Nurse Marcelino, MD (TTS technology used. I apologize for any typographical errors that were not detected and corrected.) Date: 03/03/2024; Time: 11:38 AM

## 2024-03-03 NOTE — Progress Notes (Signed)
 Safety precautions to be maintained throughout the outpatient stay will include: orient to surroundings, keep bed in low position, maintain call bell within reach at all times, provide assistance with transfer out of bed and ambulation.

## 2024-03-04 ENCOUNTER — Telehealth: Payer: Self-pay | Admitting: *Deleted

## 2024-03-04 NOTE — Telephone Encounter (Signed)
 Post procedure call; voicemail left

## 2024-03-10 ENCOUNTER — Encounter: Payer: Self-pay | Admitting: Student in an Organized Health Care Education/Training Program

## 2024-03-10 ENCOUNTER — Ambulatory Visit (HOSPITAL_BASED_OUTPATIENT_CLINIC_OR_DEPARTMENT_OTHER): Admitting: Student in an Organized Health Care Education/Training Program

## 2024-03-10 ENCOUNTER — Other Ambulatory Visit: Payer: Self-pay | Admitting: Student in an Organized Health Care Education/Training Program

## 2024-03-10 ENCOUNTER — Ambulatory Visit
Admission: RE | Admit: 2024-03-10 | Discharge: 2024-03-10 | Disposition: A | Discharge status: Home / Self Care | Source: Ambulatory Visit | Attending: Student in an Organized Health Care Education/Training Program | Admitting: Student in an Organized Health Care Education/Training Program

## 2024-03-10 VITALS — BP 120/87 | HR 67 | Temp 97.3°F | Resp 16 | Ht 68.0 in | Wt 240.0 lb

## 2024-03-10 DIAGNOSIS — G8929 Other chronic pain: Secondary | ICD-10-CM

## 2024-03-10 DIAGNOSIS — M961 Postlaminectomy syndrome, not elsewhere classified: Secondary | ICD-10-CM

## 2024-03-10 DIAGNOSIS — G894 Chronic pain syndrome: Secondary | ICD-10-CM

## 2024-03-10 DIAGNOSIS — R52 Pain, unspecified: Secondary | ICD-10-CM

## 2024-03-10 NOTE — Progress Notes (Signed)
 PROVIDER NOTE: Interpretation of information contained herein should be left to medically-trained personnel. Specific patient instructions are provided elsewhere under Patient Instructions section of medical record. This document was created in part using AI and STT-dictation technology, any transcriptional errors that may result from this process are unintentional.  Patient: Karen [name]  Service: E/M Post-op Encounter  PCP: Claudia Charleston, MD  DOB: 1951-05-27  DOS: 03/10/2024  Provider: Wallie Sherry, MD  MRN: [id number]  Delivery: Face-to-face  Specialty: Interventional Pain Management  Type: Established Patient  Setting: Ambulatory outpatient facility  Specialty designation: 09  Referring Prov.: Claudia Charleston, MD  Location: Outpatient office facility  SCS TRIAL POST-OP EVALUATION     Primary Reason(s) for Visit: Encounter for removal of temporary spinal cord stimulator lead(s) and evaluation of trial implant. CC: Foot Pain (Bilateral ) and Leg Pain (Bilateral )  HPI  Karen [name] is a 72 y.o. year old, female patient, who comes today for a post-procedure evaluation. She has Adult BMI 35.0-35.9 kg/sq m; Arthritis; Neuropathy; Leg pain, bilateral; Achilles tendinosis; RLS (restless legs syndrome); Chronic deep vein thrombosis (DVT) of proximal vein of lower extremity (HCC); Benign breast lumps; BRCA negative; Cancer (HCC); Chills without fever; Chronic pain of left heel; Depression; Hyperlipidemia; Hypertension; Hyponatremia; Increased band cell count; Irritable bowel syndrome with constipation and diarrhea; Malignant neoplasm of right female breast (HCC); Multiple joint pain; Obesity, morbid (HCC); Obstructive sleep apnea syndrome; Osteoporosis, post-menopausal; Other headache syndrome; Other insomnia; Prediabetes; Vitamin D deficiency; Weight gain with edema; Tachycardia; Pain due to onychomycosis of toenails of both feet; Neurogenic claudication due to lumbar spinal stenosis;  Bilateral carpal tunnel syndrome; Failed back surgical syndrome; Chronic radicular lumbar pain; and Chronic pain syndrome on their problem list. Her primarily concern today is the Foot Pain (Bilateral ) and Leg Pain (Bilateral )  Pain Assessment: Location: Right, Left Foot Radiating: up into calves Onset: More than a month ago Duration: Chronic pain Quality: Burning, Numbness, Tingling, Discomfort (this pain was relieved with stimulator) Severity: 1  (stimulator off)/10 (subjective, self-reported pain score)  Effect on ADL: limiting Timing: Constant Modifying factors: spinal cord stimulator trial BP: 120/87  HR: 67  Karen [name] comes in today, after a SCS (Spinal Cord Stimulator) Trial Implant on 03/04/2024, to have her percutaneous, temporary neurostimulator lead(s) removed and to evaluate the trial experience to determine if a permanent implant may be effective in controlling some or all of her chronic pain symptoms.  Further details on both, my assessment(s), as well as the proposed treatment plan, please see below.  Post-operative Assessment Intra-procedural problems/complications: None observed.         Reported side-effects: None.        Post-surgical adverse reactions or complications: None reported         Laboratory Chemistry Profile   Renal Lab Results  Component Value Date   BUN 11 10/06/2022   CREATININE 0.57 10/06/2022   GFRAA >60 02/05/2015   GFRNONAA >60 10/06/2022   PROTEINUR NEGATIVE 10/06/2022     Electrolytes Lab Results  Component Value Date   NA 136 10/06/2022   K 3.6 10/06/2022   CL 100 10/06/2022   CALCIUM 9.1 10/06/2022     Hepatic No results found for: AST, ALT, ALBUMIN, ALKPHOS, AMYLASE, LIPASE, AMMONIA   ID Lab Results  Component Value Date   STAPHAUREUS NEGATIVE 10/06/2022   MRSAPCR NEGATIVE 10/06/2022     Bone No results found for: VD25OH, CI874NY7UNU, CI6874NY7, CI7874NY7, 25OHVITD1, 25OHVITD2, 25OHVITD3,  TESTOFREE, TESTOSTERONE   Endocrine  Lab Results  Component Value Date   GLUCOSE 93 10/06/2022   GLUCOSEU NEGATIVE 10/06/2022   HGBA1C 5.5 10/06/2022     Neuropathy Lab Results  Component Value Date   HGBA1C 5.5 10/06/2022     CNS No results found for: COLORCSF, APPEARCSF, RBCCOUNTCSF, WBCCSF, POLYSCSF, LYMPHSCSF, EOSCSF, PROTEINCSF, GLUCCSF, JCVIRUS, CSFOLI, IGGCSF, LABACHR, ACETBL   Inflammation (CRP: Acute  ESR: Chronic) No results found for: CRP, ESRSEDRATE, LATICACIDVEN   Rheumatology No results found for: RF, ANA, LABURIC, URICUR, LYMEIGGIGMAB, LYMEABIGMQN, HLAB27   Coagulation Lab Results  Component Value Date   PLT 184 02/05/2015     Cardiovascular Lab Results  Component Value Date   HGB 13.1 02/05/2015   HCT 40.2 02/05/2015     Screening Lab Results  Component Value Date   STAPHAUREUS NEGATIVE 10/06/2022   MRSAPCR NEGATIVE 10/06/2022     Cancer No results found for: CEA, CA125, LABCA2   Allergens No results found for: ALMOND, APPLE, ASPARAGUS, AVOCADO, BANANA, BARLEY, BASIL, BAYLEAF, GREENBEAN, LIMABEAN, WHITEBEAN, BEEFIGE, REDBEET, BLUEBERRY, BROCCOLI, CABBAGE, MELON, CARROT, CASEIN, CASHEWNUT, CAULIFLOWER, CELERY     Note: Lab results reviewed.   Meds   Current Outpatient Medications:    cephALEXin  (KEFLEX ) 500 MG capsule, Take 1 capsule (500 mg total) by mouth 4 (four) times daily for 7 days., Disp: 28 capsule, Rfl: 0   levothyroxine (SYNTHROID, LEVOTHROID) 75 MCG tablet, Take 75 mcg by mouth daily before breakfast., Disp: , Rfl:    lisinopril (PRINIVIL,ZESTRIL) 20 MG tablet, Take 20 mg by mouth every morning., Disp: , Rfl: 3   pramipexole (MIRAPEX) 1 MG tablet, Take by mouth., Disp: , Rfl:    pregabalin (LYRICA) 75 MG capsule, Take 75 mg by mouth 3 (three) times daily., Disp: , Rfl:    B Complex-C (B-COMPLEX WITH VITAMIN C) tablet, Take 1 tablet  by mouth every evening. (Patient not taking: Reported on 12/24/2023), Disp: , Rfl:   ROS  Constitutional: Denies any fever or chills Gastrointestinal: No reported hemesis, hematochezia, vomiting, or acute GI distress Musculoskeletal: Denies any acute onset joint swelling, redness, loss of ROM, or weakness Neurological: No reported episodes of acute onset apraxia, aphasia, dysarthria, agnosia, amnesia, paralysis, loss of coordination, or loss of consciousness  Allergies  Ms. Tangney has no known allergies.  PFSH  Drug: Ms. Bolding  reports no history of drug use. Alcohol:  reports current alcohol use of about 5.0 standard drinks of alcohol per week. Tobacco:  reports that she has never smoked. She has never used smokeless tobacco. Medical:  has a past medical history of Acquired trigger finger of both middle fingers, Arthritis, Breast cancer, right (HCC) (2011), Depression, DVT (deep venous thrombosis) (HCC), GERD (gastroesophageal reflux disease), Heart murmur, Heart palpitations, Hyperlipidemia, Hypertension, Hypothyroidism, Leg edema, Narcolepsy and cataplexy, Neuropathy, Obesity, Osteoporosis, post-menopausal, Pre-diabetes, RLS (restless legs syndrome), and Sleep apnea. Surgical: Ms. Rasmus  has a past surgical history that includes Cardiac catheterization (N/A, 11/25/2014); Breast surgery; Cholecystectomy; Appendectomy; Abdominal hysterectomy; Mastectomy (Right); Colonoscopy with propofol  (N/A, 08/05/2018); Foraminotomy 1 level (Right, 10/23/2022); Carpal tunnel release (Right, 05/08/2023); Colonoscopy (N/A, 12/24/2023); and Polypectomy (12/24/2023). Family: family history includes Cancer in her mother and sister; Depression in her father; Emphysema in her father; Heart disease in her father; Hypertension in her father, mother, and sister.  Postop Exam  General appearance: Afebrile. Well nourished, well developed, and well hydrated. In no apparent acute distress. Vitals:   03/10/24 0814  BP: 120/87   Pulse: 67  Resp: 16  Temp: (!) 97.3 F (  36.3 C)  TempSrc: Temporal  SpO2: 100%  Weight: 240 lb (108.9 kg)  Height: 5' 8 (1.727 m)   BMI Assessment: Estimated body mass index is 36.49 kg/m as calculated from the following:   Height as of this encounter: 5' 8 (1.727 m).   Weight as of this encounter: 240 lb (108.9 kg).  Surgical site: Wound is healing well. No redness, tenderness, discharge, abnormal odors, or any other evidence of infection or complications.  Assessment  Primary Diagnosis & Pertinent Problem List: The primary encounter diagnosis was Lumbar post-laminectomy syndrome. Diagnoses of Failed back surgical syndrome, Chronic radicular lumbar pain, and Chronic pain syndrome were also pertinent to this visit.  Diagnosis  1. Lumbar post-laminectomy syndrome   2. Failed back surgical syndrome   3. Chronic radicular lumbar pain   4. Chronic pain syndrome      Patient endorses 65% pain relief during the duration of her spinal cord stimulator trial.  She notes improvement in her ability to perform ADLs.  Referral placed to neurosurgery below to review permanent implant  Plan of Care Orders:  Orders Placed This Encounter  Procedures   Ambulatory referral to Neurosurgery    Referral Priority:   Routine    Referral Type:   Surgical    Referral Reason:   Specialty Services Required    Referred to Provider:   Clois Fret, MD    Requested Specialty:   Neurosurgery    Number of Visits Requested:   1   Really  Medications administered: Kaci T. Bettes had no medications administered during this visit.  See the medical record for exact dosing, route, and time of administration.    Follow-up plan:   Return for patient will call to schedule F2F appt prn.     Recent Visits Date Type Provider Dept  03/03/24 Procedure visit Marcelino Nurse, MD [facility]  Showing recent visits within past 90 days and meeting all other requirements Today's  Visits Date Type Provider Dept  03/10/24 Procedure visit Marcelino Nurse, MD [facility]  Showing today's visits and meeting all other requirements Future Appointments No visits were found meeting these conditions. Showing future appointments within next 90 days and meeting all other requirements  Disposition: Discharge home  Discharge (Date  Time): 03/10/2024; 0857 hrs.   Primary Care Physician: Claudia Charleston, MD Location: [facility] Note by: Nurse Marcelino, MD (TTS technology used. I apologize for any typographical errors that were not detected and corrected.) Date: 03/10/2024; Time: 9:04 AM

## 2024-03-10 NOTE — Progress Notes (Signed)
 Safety precautions to be maintained throughout the outpatient stay will include: orient to surroundings, keep bed in low position, maintain call bell within reach at all times, provide assistance with transfer out of bed and ambulation.

## 2024-03-11 ENCOUNTER — Telehealth: Payer: Self-pay

## 2024-03-11 NOTE — Telephone Encounter (Signed)
 Post procedure follow up.  Patient states she is doing good.

## 2024-03-17 ENCOUNTER — Encounter (INDEPENDENT_AMBULATORY_CARE_PROVIDER_SITE_OTHER): Payer: Self-pay

## 2024-03-20 ENCOUNTER — Ambulatory Visit: Admitting: Neurosurgery

## 2024-03-20 ENCOUNTER — Other Ambulatory Visit: Payer: Self-pay

## 2024-03-20 ENCOUNTER — Encounter: Payer: Self-pay | Admitting: Neurosurgery

## 2024-03-20 VITALS — BP 124/74 | Ht 68.0 in | Wt 240.0 lb

## 2024-03-20 DIAGNOSIS — Z01818 Encounter for other preprocedural examination: Secondary | ICD-10-CM

## 2024-03-20 DIAGNOSIS — M961 Postlaminectomy syndrome, not elsewhere classified: Secondary | ICD-10-CM

## 2024-03-20 NOTE — Patient Instructions (Addendum)
 Please see below for information in regards to your upcoming surgery:   Planned surgery: Thoracic Laminectomy for Spinal Cord Stimulator   Surgery date: 04/16/2024 at [facility] (Medical Mall: [address] [place], [place] [address]) - you will find out your arrival time the business day before your surgery.   Pre-op appointment at [facility] Pre-admit Testing: you will receive a call with a date/time for this appointment. If you are scheduled for an in person appointment, Pre-admit Testing is located on the first floor of the [facility] building, [address], [address]. During this appointment, they will advise you which medications you can take the morning of surgery, and which medications you will need to hold for surgery. Labs (such as blood work, EKG) may be done at your pre-op appointment. You are not required to fast for these labs. Should you need to change your pre-op appointment, please call Pre-admit testing at [phone].     Surgical clearance: we will send a clearance form to Lamar Romans, MD. They may wish to see you in their office prior to signing the clearance form. If so, they may call you to schedule an appointment.      Common restrictions after spine surgery: No bending, lifting, or twisting ("BLT"). Avoid lifting objects heavier than 10 pounds for the first 6 weeks after surgery. Where possible, avoid household activities that involve lifting, bending, reaching, pushing, or pulling such as laundry, vacuuming, grocery shopping, and childcare. Try to arrange for help from friends and family for these activities while you heal. Do not drive while taking prescription pain medication. Weeks 6 through 12 after surgery: avoid lifting more than 25 pounds.     How to contact us :  If you have any questions/concerns before or after surgery, you can reach us  at [phone], or you can send a mychart message. We can be reached  by phone or mychart 8am-4pm, Monday-Friday.  *Please note: Calls after 4pm are forwarded to a third party answering service. Mychart messages are not routinely monitored during evenings, weekends, and holidays. Please call our office to contact the answering service for urgent concerns during non-business hours.   If you have FMLA/disability paperwork, please drop it off or fax it to [phone]   Appointments/FMLA & disability paperwork: Reche [employer] Ritta Registered Nurse/Surgery scheduler: Kendelyn, RN Certified Medical Assistants: Don, CMA, Elenor, CMA, & Damien, CMA Physician Assistants: Lyle Decamp, PA-C, Edsel Goods, PA-C & Glade Boys, PA-C Surgeons: Penne Sharps, MD & Reeves Daisy, MD   [facility] PREADMIT TESTING VISIT and SURGERY INFORMATION SHEET   Now that surgery has been scheduled you can anticipate several phone calls from [facility] services. A pharmacy technician will call you to verify your current list of medications taken at home.               The Pre-Service Center will call to verify your insurance information and to give you billing estimates and information.             The Preadmit Testing Office will be calling to schedule a visit to obtain information for the anesthesia team and provide instructions on preparation for surgery.  What can you expect for the Preadmit Testing Visit: Appointments may be scheduled in-person or by telephone.  If a telephone visit is scheduled, you may be asked to come into the office to have lab tests or other studies performed.   This visit will not be completed any  greater than 14 days prior to your surgery.  If your surgery has been scheduled for a future date, please do not be alarmed if we have not contacted you to schedule an appointment more than a month prior to the surgery date.    Please be prepared to provide the following information during this appointment:            -Personal medical  history                                               -Medication and allergy list            -Any history of problems with anesthesia              -Recent lab work or diagnostic studies            -Please notify us  of any needs we should be aware of to provide the best care possible           -You will be provided with instructions on how to prepare for your surgery.    On The Day of Surgery:  You must have a driver to take you home after surgery, you will be asked not to drive for 24 hours following surgery.  Taxi, Gisele and non-medical transport will not be acceptable means of transportation unless you have a responsible individual who will be traveling with you.  Visitors in the surgical area:   2 people will be able to visit you in your room once your preparation for surgery has been completed. During surgery, your visitors will be asked to wait in the Surgery Waiting Area.  It is not a requirement for them to stay, if they prefer to leave and come back.  Your visitor(s) will be given an update once the surgery has been completed.  No visitors are allowed in the initial recovery room to respect patient privacy and safety.  Once you are more awake and transfer to the secondary recovery area, or are transferred to an inpatient room, visitors will again be able to see you.  To respect and protect your privacy: We will ask on the day of surgery who your driver will be and what the contact number for that individual will be. We will ask if it is okay to share information with this individual, or if there is an alternative individual that we, or the surgeon, should contact to provide updates and information. If family or friends come to the surgical information desk requesting information about you, who you have not listed with us , no information will be given.   It may be helpful to designate someone as the main contact who will be responsible for updating your other friends and family.     PREADMIT TESTING OFFICE: [phone] SAME DAY SURGERY: [phone] We look forward to caring for you before and throughout the process of your surgery.

## 2024-03-20 NOTE — Addendum Note (Signed)
 Addended by: Paolina Karwowski E on: 03/20/2024 03:33 PM   Modules accepted: Orders

## 2024-03-20 NOTE — Progress Notes (Signed)
 REFERRING PHYSICIAN:  Marcelino Nurse, Md [address] [place],  [place] [address]  DOS: 10/23/22  Right L4-5 lumbar decompression including central laminectomy and bilateral medial facetectomies including foraminotomies   HISTORY OF PRESENT ILLNESS: 03/20/2024 [name] returns to see me after a spinal cord stimulator evaluation.  She had approximately 65% improvement in her symptoms.  Additionally, she was able to perform several more daily tasks than previously.  It substantially helped her in her activities of daily living.   12/05/2022 Karen [name] is status post Right L4-5 lumbar decompression.  She is having some increasing discomfort in her back and down her legs.  It is similar to that prior to surgery.  She is having some swelling in her right lower leg.  She has history of low deep venous thrombosis.  Family History  Problem Relation Age of Onset   Cancer Mother    Hypertension Mother    Hypertension Father    Heart disease Father    Emphysema Father    Depression Father    Cancer Sister    Hypertension Sister    Social History   Socioeconomic History   Marital status: Married    Spouse name: Not on file   Number of children: Not on file   Years of education: Not on file   Highest education level: Not on file  Occupational History   Not on file  Tobacco Use   Smoking status: Never   Smokeless tobacco: Never  Vaping Use   Vaping status: Never Used  Substance and Sexual Activity   Alcohol use: Yes    Alcohol/week: 5.0 standard drinks of alcohol    Types: 5 Shots of liquor per week   Drug use: No   Sexual activity: Not on file  Other Topics Concern   Not on file  Social History Narrative   Not on file   Social Drivers of Health   Financial Resource Strain: Low Risk  (04/23/2023)   Received from [facility]   Overall Financial Resource Strain (CARDIA)    Difficulty of Paying Living Expenses: Not hard at all  Food Insecurity:  No Food Insecurity (04/23/2023)   Received from [facility]   Hunger Vital Sign    Within the past 12 months, you worried that your food would run out before you got the money to buy more.: Never true    Within the past 12 months, the food you bought just didn't last and you didn't have money to get more.: Never true  Transportation Needs: No Transportation Needs (04/23/2023)   Received from [facility] - Transportation    In the past 12 months, has lack of transportation kept you from medical appointments or from getting medications?: No    Lack of Transportation (Non-Medical): No  Physical Activity: Not on file  Stress: Not on file  Social Connections: Not on file   Current Meds  Medication Sig   levothyroxine (SYNTHROID, LEVOTHROID) 75 MCG tablet Take 75 mcg by mouth daily before breakfast.   lisinopril (PRINIVIL,ZESTRIL) 20 MG tablet Take 20 mg by mouth every morning.   pramipexole (MIRAPEX) 1 MG tablet Take by mouth.   pregabalin (LYRICA) 75 MG capsule Take 75 mg by mouth 3 (three) times daily.   No Known Allergies  PHYSICAL EXAMINATION:  General: Patient is well developed, well nourished, calm, collected, and in no apparent distress.   NEUROLOGICAL:  General: In no acute distress.  Awake, alert, oriented to person, place, and time.  Pupils equal round and reactive to light.  Facial tone is symmetric.     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  She has had a successful spinal cord stimulator evaluation trial.  She is an excellent candidate for placement.    I discussed the planned procedure at length with the patient, including the risks, benefits, alternatives, and indications. The risks discussed  include but are not limited to bleeding, infection, need for reoperation, spinal fluid leak, stroke, vision loss, anesthetic complication, coma, paralysis, and even death. I also described in detail that improvement was not guaranteed.  The patient expressed understanding of these risks, and asked that we proceed with surgery. I described the surgery in layman's terms, and gave ample opportunity for questions, which were answered to the best of my ability.  I spent a total of 20 minutes in this patient's care today. This time was spent reviewing pertinent records including imaging studies, obtaining and confirming history, performing a directed evaluation, formulating and discussing my recommendations, and documenting the visit within the medical record.   Reeves Daisy Department of neurosurgery

## 2024-03-21 ENCOUNTER — Encounter: Payer: Self-pay | Admitting: Neurosurgery

## 2024-04-01 ENCOUNTER — Encounter: Payer: Self-pay | Admitting: Podiatry

## 2024-04-01 ENCOUNTER — Ambulatory Visit: Admitting: Podiatry

## 2024-04-01 VITALS — Ht 68.0 in | Wt 240.0 lb

## 2024-04-01 DIAGNOSIS — M2041 Other hammer toe(s) (acquired), right foot: Secondary | ICD-10-CM

## 2024-04-01 NOTE — Progress Notes (Signed)
 Chief Complaint  Patient presents with   Hammer Toe    Pt is here due to hammertoe on the right foot 3rd digit, states she has been here for the same reason, states the toe is very tender underneath.    HPI: 73 y.o. female presenting today for evaluation of pain and tenderness associated to the distal aspect of the right third toe.  Patient states that when she walks on the foot she has toe pain to the right third digit specifically.  History of surgery to the bilateral feet.  No recent injury.  Presenting for further treatment evaluation  Past Medical History:  Diagnosis Date   Acquired trigger finger of both middle fingers    Arthritis    Breast cancer, right (HCC) 2011   Right Lumpectomy and Rad tx's.   Depression    DVT (deep venous thrombosis) (HCC)    leg   GERD (gastroesophageal reflux disease)    Heart murmur    Heart palpitations    Hyperlipidemia    Hypertension    Hypothyroidism    Leg edema    Narcolepsy and cataplexy    Neuropathy    Obesity    Osteoporosis, post-menopausal    Pre-diabetes    RLS (restless legs syndrome)    Sleep apnea    not using cpap machine-feels like she is drowning    Past Surgical History:  Procedure Laterality Date   ABDOMINAL HYSTERECTOMY     APPENDECTOMY     BREAST SURGERY     CARDIAC CATHETERIZATION N/A 11/25/2014   Procedure: Left Heart Cath;  Surgeon: Vinie DELENA Jude, MD;  Location: [facility] INVASIVE CV LAB;  Service: Cardiovascular;  Laterality: N/A;   CARPAL TUNNEL RELEASE Right 05/08/2023   Procedure: RIGHT CARPAL TUNNEL RELEASE WITH ULTRASOUND GUIDANCE;  Surgeon: Claudene Penne ORN, MD;  Location: [facility] ORS;  Service: Neurosurgery;  Laterality: Right;   CHOLECYSTECTOMY     COLONOSCOPY N/A 12/24/2023   Procedure: COLONOSCOPY;  Surgeon: Maryruth Ole DASEN, MD;  Location: [facility] ENDOSCOPY;  Service: Endoscopy;  Laterality: N/A;   COLONOSCOPY WITH PROPOFOL  N/A 08/05/2018   Procedure: COLONOSCOPY WITH PROPOFOL ;  Surgeon: Gaylyn Gladis PENNER, MD;  Location: [facility] ENDOSCOPY;  Service: Endoscopy;  Laterality: N/A;   FORAMINOTOMY 1 LEVEL Right 10/23/2022   Procedure: RIGHT L4-5 LAMINOFORAMINOTOMY;  Surgeon: Clois Fret, MD;  Location: [facility] ORS;  Service: Neurosurgery;  Laterality: Right;   MASTECTOMY Right    partial/lumpectomy   POLYPECTOMY  12/24/2023   Procedure: POLYPECTOMY, INTESTINE;  Surgeon: Maryruth Ole DASEN, MD;  Location: [facility] ENDOSCOPY;  Service: Endoscopy;;    No Known Allergies   Physical Exam: General: The patient is alert and oriented x3 in no acute distress.  Dermatology: Skin is warm, dry and supple bilateral lower extremities.  Callus noted to the distal aspect of the third digits bilateral with associated tenderness with palpation  Vascular: Palpable pedal pulses bilaterally. Capillary refill within normal limits.  No appreciable edema.  No erythema.  Neurological: Grossly intact via light touch  Musculoskeletal Exam: History of surgery bilateral feet.  Residual hammertoe contracture noted to the lesser digits.  The bilateral third toes actually are contracted with excessive pressure on the distal tips of the toe creating callus and pain RT > LT  Radiographic Exam RT foot 05/29/2023:  Orthopedic screws within the first metatarsal unchanged.  Shortening of the first metatarsal noted.  The stress reaction fracture along the diaphysis of the second metatarsal is healed.  Dorsal angulation noted to  the distal aspect of the second metatarsal.  Moderate degenerative changes noted throughout the pedal joints. No acute fractures identified  Assessment/Plan of Care: 1.  Symptomatic callus secondary to hammertoe deformity third digit bilateral 2.  History of bilateral foot surgery  -Patient evaluated.   -Conservatively I do believe the patient would feel significantly better if we were able to offload pressure from the distal tip of the third digits.  Patient agrees - Additional silicone toe crest pads were  dispensed for the patient to wear daily to offload pressure from the distal tips of the toes.  Patient states the only helped temporarily -Advised against going barefoot.  Recommend socks and shoes -Return to clinic as needed    Thresa EMERSON Sar, DPM [facility]  Dr. Thresa EMERSON Sar, DPM    [address] [place], [place] [address]                Office [phone]  Fax [phone]

## 2024-04-09 ENCOUNTER — Other Ambulatory Visit

## 2024-04-22 ENCOUNTER — Other Ambulatory Visit: Payer: Self-pay

## 2024-04-22 ENCOUNTER — Encounter
Admission: RE | Admit: 2024-04-22 | Discharge: 2024-04-22 | Disposition: A | Discharge status: Home / Self Care | Source: Ambulatory Visit | Attending: Neurosurgery | Admitting: Neurosurgery

## 2024-04-22 ENCOUNTER — Ambulatory Visit: Payer: Self-pay | Admitting: Urgent Care

## 2024-04-22 VITALS — BP 140/86 | HR 78 | Resp 18 | Ht 68.0 in | Wt 246.0 lb

## 2024-04-22 DIAGNOSIS — Z79899 Other long term (current) drug therapy: Secondary | ICD-10-CM | POA: Insufficient documentation

## 2024-04-22 DIAGNOSIS — Z01812 Encounter for preprocedural laboratory examination: Secondary | ICD-10-CM | POA: Diagnosis not present

## 2024-04-22 DIAGNOSIS — Z01818 Encounter for other preprocedural examination: Secondary | ICD-10-CM | POA: Diagnosis present

## 2024-04-22 HISTORY — DX: Diverticulitis of intestine, part unspecified, without perforation or abscess without bleeding: K57.92

## 2024-04-22 LAB — URINALYSIS, COMPLETE (UACMP) WITH MICROSCOPIC
Bilirubin Urine: NEGATIVE
Glucose, UA: NEGATIVE mg/dL
Hgb urine dipstick: NEGATIVE
Ketones, ur: NEGATIVE mg/dL
Nitrite: POSITIVE — AB
Protein, ur: NEGATIVE mg/dL
Specific Gravity, Urine: 1.011 (ref 1.005–1.030)
Squamous Epithelial / HPF: 0 /HPF (ref 0–5)
WBC, UA: >50 WBC/hpf (ref 0–5)
pH: 6 (ref 5.0–8.0)

## 2024-04-22 LAB — TYPE AND SCREEN
ABO/RH(D): A POS
Antibody Screen: NEGATIVE

## 2024-04-22 LAB — SURGICAL PCR SCREEN
MRSA, PCR: NEGATIVE
Staphylococcus aureus: NEGATIVE

## 2024-04-22 NOTE — Patient Instructions (Signed)
 Your procedure is scheduled on: Wednesday 04/30/24 Report to the Registration Desk on the 1st floor of the [facility]. To find out your arrival time, please call [phone] between 1PM - 3PM on: Tuesday 04/29/24 If your arrival time is 6:00 am, do not arrive before that time as the [facility] entrance doors do not open until 6:00 am.  REMEMBER: Instructions that are not followed completely may result in serious medical risk, up to and including death; or upon the discretion of your surgeon and anesthesiologist your surgery may need to be rescheduled.  Do not eat food after midnight the night before surgery.  No gum chewing or hard candies.  You may however, drink CLEAR liquids up to 2 hours before you are scheduled to arrive for your surgery. Do not drink anything within 2 hours of your scheduled arrival time.  Clear liquids include: - water  - apple juice without pulp - gatorade (not RED colors) - black coffee or tea (Do NOT add milk or creamers to the coffee or tea) Do NOT drink anything that is not on this list.  **Type 1 and Type 2 diabetics should only drink water.**  One week prior to surgery: Stop Anti-inflammatories (NSAIDS) such as Advil , Aleve, Ibuprofen , Motrin , Naproxen, Naprosyn and Aspirin based products such as Excedrin, Goody's Powder, BC Powder. You can continue your Meloxicam  but do not take on the day of your surgery.  You may however, continue to take Tylenol  if needed for pain up until the day of surgery.  Stop ANY OVER THE COUNTER supplements and vitamins until after surgery. You may continue your probiotic  Continue taking all of your other prescription medications up until the day of surgery.  ON THE DAY OF SURGERY ONLY TAKE THESE MEDICATIONS WITH SIPS OF WATER:  DULoxetine  (CYMBALTA ) 20 MG capsule  levothyroxine (SYNTHROID, LEVOTHROID) 75 MCG tablet  pramipexole (MIRAPEX) 1 MG tablet  pregabalin (LYRICA) 100 MG capsule  rosuvastatin (CRESTOR) 10  MG tablet  trospium (SANCTURA) 20 MG tablet   No Alcohol for 24 hours before or after surgery.  No Smoking including e-cigarettes for 24 hours before surgery.  No chewable tobacco products for at least 6 hours before surgery.  No nicotine patches on the day of surgery.  Do not use any recreational drugs for at least a week (preferably 2 weeks) before your surgery.  Please be advised that the combination of cocaine and anesthesia may have negative outcomes, up to and including death. If you test positive for cocaine, your surgery will be cancelled.  On the morning of surgery brush your teeth with toothpaste and water, you may rinse your mouth with mouthwash if you wish. Do not swallow any toothpaste or mouthwash.  Use CHG Soap or wipes as directed on instruction sheet.  Do not wear lotions, powders, or perfumes on the day of surgery.  Do not shave body hair from the neck down when you start your showers  Wear comfortable clothing (specific to your surgery type) to the hospital.  Do not wear jewelry, make-up, hairpins, clips or nail polish.  For welded (permanent) jewelry: bracelets, anklets, waist bands, etc.  Please have this removed prior to surgery.  If it is not removed, there is a chance that hospital personnel will need to cut it off on the day of surgery.  Contact lenses, hearing aids and dentures may not be worn into surgery.  Do not bring valuables to the hospital. [facility] is not responsible for any missing/lost  belongings or valuables.   Notify your doctor if there is any change in your medical condition (cold, fever, infection).  After surgery, you can help prevent lung complications by doing breathing exercises.  Take deep breaths and cough every 1-2 hours. Your doctor may order a device called an Incentive Spirometer to help you take deep breaths.  If you are being discharged the day of surgery, you will not be allowed to drive home. You will need a responsible  individual to drive you home and stay with you for 24 hours after surgery.   Please call the Pre-admissions Testing Dept. at [phone] if you have any questions about these instructions.  Surgery Visitation Policy:  Patients having surgery or a procedure may have two visitors.  Children under the age of 59 must have an adult with them who is not the patient.  I Merchandiser, Retail to address health-related social needs:  [email or web address]    Pre-operative 4 CHG Bath Instructions   You can play a key role in reducing the risk of infection after surgery. Your skin needs to be as free of germs as possible. You can reduce the number of germs on your skin by washing with CHG (chlorhexidine  gluconate) soap before surgery. CHG is an antiseptic soap that kills germs and continues to kill germs even after washing.   DO NOT use if you have an allergy to chlorhexidine /CHG or antibacterial soaps. If your skin becomes reddened or irritated, stop using the CHG and notify one of our RNs at [phone].   Please shower with the CHG soap starting 4 days before surgery using the following schedule:   Saturday 04/26/24 - Tuesday 04/29/24    Please keep in mind the following:  DO NOT shave, including legs and underarms, starting the day of your first shower.   You may shave your face at any point before/day of surgery.  Place clean sheets on your bed the day you start using CHG soap. Use a clean washcloth (not used since being washed) for each shower. DO NOT sleep with pets once you start using the CHG.   CHG Shower Instructions:  If you choose to wash your hair and private area, wash first with your normal shampoo/soap.  After you use shampoo/soap, rinse your hair and body thoroughly to remove shampoo/soap residue.  Turn the water OFF and apply about 3 tablespoons (45 ml) of CHG soap to a CLEAN washcloth.  Apply CHG soap ONLY FROM YOUR NECK DOWN TO YOUR TOES (washing  for 3-5 minutes)  DO NOT use CHG soap on face, private areas, open wounds, or sores.  Pay special attention to the area where your surgery is being performed.  If you are having back surgery, having someone wash your back for you may be helpful. Wait 2 minutes after CHG soap is applied, then you may rinse off the CHG soap.  Pat dry with a clean towel  Put on clean clothes/pajamas   If you choose to wear lotion, please use ONLY the CHG-compatible lotions on the back of this paper.     Additional instructions for the day of surgery: DO NOT APPLY any lotions, deodorants, cologne, or perfumes.   Put on clean/comfortable clothes.  Brush your teeth.  Ask your nurse before applying any prescription medications to the skin.      CHG Compatible Lotions   Aveeno Moisturizing lotion  Cetaphil Moisturizing Cream  Cetaphil Moisturizing Lotion  Clairol Herbal Essence Moisturizing Lotion, Dry Skin  Clairol  Herbal Essence Moisturizing Lotion, Extra Dry Skin  Clairol Herbal Essence Moisturizing Lotion, Normal Skin  Curel Age Defying Therapeutic Moisturizing Lotion with Alpha Hydroxy  Curel Extreme Care Body Lotion  Curel Soothing Hands Moisturizing Hand Lotion  Curel Therapeutic Moisturizing Cream, Fragrance-Free  Curel Therapeutic Moisturizing Lotion, Fragrance-Free  Curel Therapeutic Moisturizing Lotion, Original Formula  Eucerin Daily Replenishing Lotion  Eucerin Dry Skin Therapy Plus Alpha Hydroxy Crme  Eucerin Dry Skin Therapy Plus Alpha Hydroxy Lotion  Eucerin Original Crme  Eucerin Original Lotion  Eucerin Plus Crme Eucerin Plus Lotion  Eucerin TriLipid Replenishing Lotion  Keri Anti-Bacterial Hand Lotion  Keri Deep Conditioning Original Lotion Dry Skin Formula Softly Scented  Keri Deep Conditioning Original Lotion, Fragrance Free Sensitive Skin Formula  Keri Lotion Fast Absorbing Fragrance Free Sensitive Skin Formula  Keri Lotion Fast Absorbing Softly Scented Dry Skin Formula   Keri Original Lotion  Keri Skin Renewal Lotion Keri Silky Smooth Lotion  Keri Silky Smooth Sensitive Skin Lotion  Nivea Body Creamy Conditioning Oil  Nivea Body Extra Enriched Teacher, Adult Education Moisturizing Lotion Nivea Crme  Nivea Skin Firming Lotion  NutraDerm 30 Skin Lotion  NutraDerm Skin Lotion  NutraDerm Therapeutic Skin Cream  NutraDerm Therapeutic Skin Lotion  ProShield Protective Hand Cream  Provon moisturizing lotion

## 2024-04-23 ENCOUNTER — Ambulatory Visit: Payer: Self-pay | Admitting: Urgent Care

## 2024-04-23 ENCOUNTER — Other Ambulatory Visit

## 2024-04-23 DIAGNOSIS — N390 Urinary tract infection, site not specified: Secondary | ICD-10-CM

## 2024-04-23 DIAGNOSIS — Z01812 Encounter for preprocedural laboratory examination: Secondary | ICD-10-CM

## 2024-04-23 MED ORDER — SACCHAROMYCES BOULARDII 250 MG PO CAPS: 250.0000 mg | ORAL_CAPSULE | Freq: Two times a day (BID) | ORAL | 0 refills | Status: DC

## 2024-04-23 MED ORDER — CIPROFLOXACIN HCL 500 MG PO TABS: 500.0000 mg | ORAL_TABLET | Freq: Two times a day (BID) | ORAL | 0 refills | Status: AC

## 2024-04-23 NOTE — Progress Notes (Signed)
 [facility] Perioperative Services: Pre-Admission/Anesthesia Testing  Abnormal Lab Notification and Treatment Plan of Care   Date: 04/23/24  Name: Karen [name] DOB: [birth year] MRN:   [id number]  Re: Abnormal labs noted during PAT appointment   Notified:  Provider Name Provider Role Notification Mode  Clois Fret, MD Neurosurgery (Surgeon) Routed and/or faxed via [facility]   Abnormal Lab Value(s):   Lab Results  Component Value Date   COLORURINE YELLOW (A) 04/22/2024   APPEARANCEUR HAZY (A) 04/22/2024   LABSPEC 1.011 04/22/2024   PHURINE 6.0 04/22/2024   GLUCOSEU NEGATIVE 04/22/2024   HGBUR NEGATIVE 04/22/2024   BILIRUBINUR NEGATIVE 04/22/2024   KETONESUR NEGATIVE 04/22/2024   PROTEINUR NEGATIVE 04/22/2024   NITRITE POSITIVE (A) 04/22/2024   LEUKOCYTESUR MODERATE (A) 04/22/2024   EPIU 0 04/22/2024   WBCU >50 04/22/2024   RBCU 0-5 04/22/2024   BACTERIA MANY (A) 04/22/2024   CULT (A) 04/22/2024    >=100,000 COLONIES/mL ESCHERICHIA COLI SUSCEPTIBILITIES TO FOLLOW Performed at [facility], [address] [place], [place] [address]     Clinical Information and Notes:  Patient is scheduled for THORACIC LAMINECTOMY FOR SPINAL CORD STIMULATOR on 04/30/2024.    UA performed in PAT consistent with/concerning for infection.  No leukocytosis noted on CBC; WBC 8. Renal function: BUN 13 and creatinine 13 mg/dL  on 89/87/7974 Urine C&S added to assess for pathogenically significant growth.   Impression and Plan:  Karen [name] with a UA that was (+) for infection; reflex culture sent. Contacted patient to discuss.  Patient not experiencing significant symptoms at this time, however she is unable to tell if she is having back pain beyond what she normally has.  Of note, patient recently treated for pyelonephritis at [facility].  Culture at that time grew out Escherichia coli.  Patient was treated with intravenous  CEFTRIAXONE x 1 g and 10 days of CEFPODOXIME 200 mg twice daily.  Patient did not require admission for her pyelonephritis.  Overall, patient feels like she is improved.    She is not experiencing as intense symptoms as she was a month ago.  That said, urine culture growing out significant Escherichia coli colony counts.  Patient is very concerned about her results and the potential of her surgery being postponed.  She is out of town in [place]  at this time.  Patient asked for prescription to be sent to [facility] there.  Patient was advised that final culture was not back.  Patient does not wish to wait and wishes to start on empiric therapy tonight.  Given her urine results, I feel like this is reasonable.    Allergies reviewed. Culture report also reviewed to ensure culture appropriate coverage is being provided. Will send in prescription to [place], [place] for an empiric 5-day course of CIPROFLOXACIN  500 mg twice daily. Patient encouraged to complete the entire course of antibiotics even if she begins to feel better. She was advised that if culture demonstrates resistance to the prescribed antibiotic, she will be contacted and advised of the need to change the antibiotic being used to treat her infection.  Additionally, there is concern for patient developing possible CDAD due to dual coverage approximately 1 month ago (ceftriaxone + cefpodoxime).  Patient now being treated with a another class of antibiotics.  Additionally, she will receive both  cefazolin  + vancomycin with her upcoming surgery.  Sending in a prescription for probiotic (saccharomyces boulardii) BID while on antibiotics. I will have her  take it 2 hours before each dose to promote benefits.  Additionally, I will have her continue the probiotic medication twice a day until the end of the month in efforts to help restore normal gut flora that has been affected by multiple antimicrobials.  Meds ordered this encounter  Medications    ciprofloxacin  (CIPRO ) 500 MG tablet    Sig: Take 1 tablet (500 mg total) by mouth 2 (two) times daily for 5 days. Increase WATER intake while taking this medication.    Dispense:  10 tablet    Refill:  0    Please contact the patient as soon as it is available for pickup. Rx is for preoperative UTI treatment and needs to be started ASAP.   saccharomyces boulardii (FLORASTOR) 250 MG capsule    Sig: Take 1 capsule (250 mg total) by mouth 2 (two) times daily for 25 days.    Dispense:  50 capsule    Refill:  0    Take 1 capsule 2 hours before antibiotic dose BID, then continue BID until complete.   Patient encouraged to increase her fluid intake as much as possible. Discussed that water is always best to flush the urinary tract. She was advised to avoid caffeine containing fluids until her infections clears, as caffeine can cause her to experience painful bladder spasms.   May use Tylenol  as needed for pain/fever should she experience these symptoms.   Patient instructed to call surgeon's office or PAT with any questions or concerns related to the above outlined course of treatment. Additionally, she was instructed to call if she feels like she is getting worse overall while on treatment. Results and treatment plan of care forwarded to primary attending surgeon to make them aware.   Encounter Diagnoses  Name Primary?   Pre-operative laboratory examination Yes   E. coli UTI (urinary tract infection)    Dorise Pereyra, MSN, APRN, FNP-C, CEN [facility]  Perioperative Services Nurse Practitioner Phone: [phone] Fax: [phone] 04/23/24 4:48 PM  NOTE: This note has been prepared using Dragon dictation software. Despite my best ability to proofread, there is always the potential that unintentional transcriptional errors may still occur from this process.

## 2024-04-24 LAB — URINE CULTURE: Culture: >=100000 — AB

## 2024-04-28 ENCOUNTER — Other Ambulatory Visit: Payer: Self-pay | Admitting: Medical Genetics

## 2024-04-28 DIAGNOSIS — Z006 Encounter for examination for normal comparison and control in clinical research program: Secondary | ICD-10-CM

## 2024-04-30 ENCOUNTER — Ambulatory Visit: Payer: Self-pay | Admitting: Urgent Care

## 2024-04-30 ENCOUNTER — Ambulatory Visit

## 2024-04-30 ENCOUNTER — Encounter: Payer: Self-pay | Admitting: Neurosurgery

## 2024-04-30 ENCOUNTER — Other Ambulatory Visit: Payer: Self-pay

## 2024-04-30 ENCOUNTER — Ambulatory Visit
Admission: RE | Admit: 2024-04-30 | Discharge: 2024-04-30 | Disposition: A | Discharge status: Home / Self Care | Source: Ambulatory Visit | Attending: Neurosurgery | Admitting: Neurosurgery

## 2024-04-30 ENCOUNTER — Encounter
Admission: RE | Disposition: A | Discharge status: Home / Self Care | Payer: Self-pay | Source: Ambulatory Visit | Attending: Neurosurgery

## 2024-04-30 ENCOUNTER — Ambulatory Visit (INDEPENDENT_AMBULATORY_CARE_PROVIDER_SITE_OTHER): Admitting: Nurse Practitioner

## 2024-04-30 ENCOUNTER — Encounter: Admitting: Orthopedic Surgery

## 2024-04-30 DIAGNOSIS — G4730 Sleep apnea, unspecified: Secondary | ICD-10-CM | POA: Diagnosis not present

## 2024-04-30 DIAGNOSIS — G709 Myoneural disorder, unspecified: Secondary | ICD-10-CM | POA: Insufficient documentation

## 2024-04-30 DIAGNOSIS — Z79899 Other long term (current) drug therapy: Secondary | ICD-10-CM | POA: Diagnosis not present

## 2024-04-30 DIAGNOSIS — Z8249 Family history of ischemic heart disease and other diseases of the circulatory system: Secondary | ICD-10-CM | POA: Diagnosis not present

## 2024-04-30 DIAGNOSIS — I10 Essential (primary) hypertension: Secondary | ICD-10-CM | POA: Diagnosis not present

## 2024-04-30 DIAGNOSIS — Z01818 Encounter for other preprocedural examination: Secondary | ICD-10-CM

## 2024-04-30 DIAGNOSIS — Z818 Family history of other mental and behavioral disorders: Secondary | ICD-10-CM | POA: Insufficient documentation

## 2024-04-30 DIAGNOSIS — Z01812 Encounter for preprocedural laboratory examination: Secondary | ICD-10-CM

## 2024-04-30 DIAGNOSIS — K219 Gastro-esophageal reflux disease without esophagitis: Secondary | ICD-10-CM | POA: Insufficient documentation

## 2024-04-30 DIAGNOSIS — M961 Postlaminectomy syndrome, not elsewhere classified: Secondary | ICD-10-CM | POA: Insufficient documentation

## 2024-04-30 DIAGNOSIS — M48062 Spinal stenosis, lumbar region with neurogenic claudication: Secondary | ICD-10-CM

## 2024-04-30 DIAGNOSIS — E039 Hypothyroidism, unspecified: Secondary | ICD-10-CM | POA: Insufficient documentation

## 2024-04-30 DIAGNOSIS — R8271 Bacteriuria: Secondary | ICD-10-CM

## 2024-04-30 DIAGNOSIS — R8290 Unspecified abnormal findings in urine: Secondary | ICD-10-CM

## 2024-04-30 DIAGNOSIS — F32A Depression, unspecified: Secondary | ICD-10-CM | POA: Diagnosis not present

## 2024-04-30 HISTORY — PX: THORACIC LAMINECTOMY FOR SPINAL CORD STIMULATOR: SHX6887

## 2024-04-30 SURGERY — THORACIC LAMINECTOMY FOR SPINAL CORD STIMULATOR
Anesthesia: General | Site: Spine Thoracic

## 2024-04-30 MED ORDER — SUCCINYLCHOLINE CHLORIDE 200 MG/10ML IV SOSY
PREFILLED_SYRINGE | INTRAVENOUS | Status: DC | PRN
  Administered 2024-04-30: 100 mg via INTRAVENOUS

## 2024-04-30 MED ORDER — DROPERIDOL 2.5 MG/ML IJ SOLN: 0.6250 mg | Freq: Once | INTRAMUSCULAR | Status: DC | PRN

## 2024-04-30 MED ORDER — ONDANSETRON HCL 4 MG/2ML IJ SOLN
INTRAMUSCULAR | Status: DC | PRN
  Administered 2024-04-30: 4 mg via INTRAVENOUS

## 2024-04-30 MED ORDER — PROPOFOL 10 MG/ML IV BOLUS
INTRAVENOUS | Status: AC
  Filled 2024-04-30: qty 20

## 2024-04-30 MED ORDER — VANCOMYCIN HCL IN DEXTROSE 1-5 GM/200ML-% IV SOLN
1000.0000 mg | Freq: Once | INTRAVENOUS | Status: AC
  Administered 2024-04-30: 1000 mg via INTRAVENOUS

## 2024-04-30 MED ORDER — SODIUM CHLORIDE (PF) 0.9 % IJ SOLN
INTRAMUSCULAR | Status: AC
  Filled 2024-04-30: qty 20

## 2024-04-30 MED ORDER — FENTANYL CITRATE (PF) 100 MCG/2ML IJ SOLN
INTRAMUSCULAR | Status: AC
  Filled 2024-04-30: qty 2

## 2024-04-30 MED ORDER — PHENYLEPHRINE HCL-NACL 20-0.9 MG/250ML-% IV SOLN
INTRAVENOUS | Status: AC
  Filled 2024-04-30: qty 250

## 2024-04-30 MED ORDER — METHOCARBAMOL 500 MG PO TABS
500.0000 mg | ORAL_TABLET | Freq: Four times a day (QID) | ORAL | 0 refills | Status: DC
  Filled 2024-04-30: qty 120, 30d supply, fill #0

## 2024-04-30 MED ORDER — OXYCODONE HCL 5 MG PO TABS
5.0000 mg | ORAL_TABLET | Freq: Once | ORAL | Status: AC | PRN
  Administered 2024-04-30: 5 mg via ORAL

## 2024-04-30 MED ORDER — CHLORHEXIDINE GLUCONATE 0.12 % MT SOLN
15.0000 mL | Freq: Once | OROMUCOSAL | Status: AC
  Administered 2024-04-30: 15 mL via OROMUCOSAL

## 2024-04-30 MED ORDER — ACETAMINOPHEN 10 MG/ML IV SOLN
1000.0000 mg | Freq: Once | INTRAVENOUS | Status: DC | PRN
Start: 2024-04-30 — End: 2024-04-30
  Administered 2024-04-30: 1000 mg via INTRAVENOUS

## 2024-04-30 MED ORDER — FENTANYL CITRATE (PF) 100 MCG/2ML IJ SOLN
25.0000 ug | INTRAMUSCULAR | Status: DC | PRN
  Administered 2024-04-30 (×3): 25 ug via INTRAVENOUS

## 2024-04-30 MED ORDER — CHLORHEXIDINE GLUCONATE 0.12 % MT SOLN
OROMUCOSAL | Status: AC
Start: 2024-04-30 — End: 2024-04-30
  Filled 2024-04-30: qty 15

## 2024-04-30 MED ORDER — CEFAZOLIN SODIUM-DEXTROSE 2-4 GM/100ML-% IV SOLN
INTRAVENOUS | Status: AC
  Filled 2024-04-30: qty 100

## 2024-04-30 MED ORDER — SURGIFLO WITH THROMBIN (HEMOSTATIC MATRIX KIT) OPTIME
TOPICAL | Status: DC | PRN
Start: 2024-04-30 — End: 2024-04-30
  Administered 2024-04-30: 1 via TOPICAL

## 2024-04-30 MED ORDER — PROPOFOL 10 MG/ML IV BOLUS
INTRAVENOUS | Status: DC | PRN
  Administered 2024-04-30: 50 mg via INTRAVENOUS
  Administered 2024-04-30: 75 ug/kg/min via INTRAVENOUS
  Administered 2024-04-30: 130 ug/kg/min via INTRAVENOUS

## 2024-04-30 MED ORDER — VANCOMYCIN HCL IN DEXTROSE 1-5 GM/200ML-% IV SOLN
INTRAVENOUS | Status: AC
Start: 2024-04-30 — End: 2024-04-30
  Filled 2024-04-30: qty 200

## 2024-04-30 MED ORDER — BUPIVACAINE HCL (PF) 0.5 % IJ SOLN
INTRAMUSCULAR | Status: AC
  Filled 2024-04-30: qty 30

## 2024-04-30 MED ORDER — BUPIVACAINE-EPINEPHRINE (PF) 0.5% -1:200000 IJ SOLN
INTRAMUSCULAR | Status: AC
  Filled 2024-04-30: qty 30

## 2024-04-30 MED ORDER — OXYCODONE HCL 5 MG PO TABS
ORAL_TABLET | ORAL | Status: AC
  Filled 2024-04-30: qty 1

## 2024-04-30 MED ORDER — ORAL CARE MOUTH RINSE: 15.0000 mL | Freq: Once | OROMUCOSAL | Status: AC

## 2024-04-30 MED ORDER — KETAMINE HCL 50 MG/5ML IJ SOSY
PREFILLED_SYRINGE | INTRAMUSCULAR | Status: AC
Start: 2024-04-30 — End: 2024-04-30
  Filled 2024-04-30: qty 5

## 2024-04-30 MED ORDER — LACTATED RINGERS IV SOLN: INTRAVENOUS | Status: DC

## 2024-04-30 MED ORDER — FENTANYL CITRATE (PF) 100 MCG/2ML IJ SOLN
INTRAMUSCULAR | Status: DC | PRN
  Administered 2024-04-30: 50 ug via INTRAVENOUS

## 2024-04-30 MED ORDER — POLYETHYLENE GLYCOL 3350 17 GM/SCOOP PO POWD
17.0000 g | Freq: Every day | ORAL | 0 refills | Status: DC | PRN
  Filled 2024-04-30: qty 238, 14d supply, fill #0

## 2024-04-30 MED ORDER — BUPIVACAINE-EPINEPHRINE (PF) 0.5% -1:200000 IJ SOLN
INTRAMUSCULAR | Status: DC | PRN
  Administered 2024-04-30: 10 mL

## 2024-04-30 MED ORDER — ACETAMINOPHEN 10 MG/ML IV SOLN
INTRAVENOUS | Status: AC
Start: 2024-04-30 — End: 2024-04-30
  Filled 2024-04-30: qty 100

## 2024-04-30 MED ORDER — SUCCINYLCHOLINE CHLORIDE 200 MG/10ML IV SOSY
PREFILLED_SYRINGE | INTRAVENOUS | Status: AC
  Filled 2024-04-30: qty 10

## 2024-04-30 MED ORDER — PHENYLEPHRINE HCL-NACL 20-0.9 MG/250ML-% IV SOLN
INTRAVENOUS | Status: DC | PRN
  Administered 2024-04-30: 40 ug/min via INTRAVENOUS

## 2024-04-30 MED ORDER — BUPIVACAINE LIPOSOME 1.3 % IJ SUSP
INTRAMUSCULAR | Status: AC
Start: 2024-04-30 — End: 2024-04-30
  Filled 2024-04-30: qty 20

## 2024-04-30 MED ORDER — SODIUM CHLORIDE (PF) 0.9 % IJ SOLN
INTRAMUSCULAR | Status: DC | PRN
  Administered 2024-04-30: 60 mL via INTRAMUSCULAR

## 2024-04-30 MED ORDER — CEFAZOLIN SODIUM-DEXTROSE 2-4 GM/100ML-% IV SOLN
2.0000 g | Freq: Once | INTRAVENOUS | Status: AC
  Administered 2024-04-30: 2 g via INTRAVENOUS

## 2024-04-30 MED ORDER — LIDOCAINE HCL (PF) 2 % IJ SOLN
INTRAMUSCULAR | Status: AC
  Filled 2024-04-30: qty 5

## 2024-04-30 MED ORDER — DEXAMETHASONE SOD PHOSPHATE PF 10 MG/ML IJ SOLN
INTRAMUSCULAR | Status: DC | PRN
  Administered 2024-04-30: 10 mg via INTRAVENOUS

## 2024-04-30 MED ORDER — OXYCODONE HCL 5 MG PO TABS
5.0000 mg | ORAL_TABLET | ORAL | 0 refills | Status: AC | PRN
  Filled 2024-04-30: qty 30, 5d supply, fill #0

## 2024-04-30 MED ORDER — KETAMINE HCL 50 MG/5ML IJ SOSY
PREFILLED_SYRINGE | INTRAMUSCULAR | Status: DC | PRN
  Administered 2024-04-30: 30 mg via INTRAVENOUS

## 2024-04-30 MED ORDER — PROPOFOL 1000 MG/100ML IV EMUL
INTRAVENOUS | Status: AC
  Filled 2024-04-30: qty 100

## 2024-04-30 MED ORDER — OXYCODONE HCL 5 MG/5ML PO SOLN: 5.0000 mg | Freq: Once | ORAL | Status: AC | PRN

## 2024-04-30 MED ORDER — MIDAZOLAM HCL 2 MG/2ML IJ SOLN
INTRAMUSCULAR | Status: AC
  Filled 2024-04-30: qty 2

## 2024-04-30 MED ORDER — IRRISEPT - 450ML BOTTLE WITH 0.05% CHG IN STERILE WATER, USP 99.95% OPTIME
TOPICAL | Status: DC | PRN
  Administered 2024-04-30: 450 mL

## 2024-04-30 MED ORDER — MIDAZOLAM HCL (PF) 2 MG/2ML IJ SOLN
INTRAMUSCULAR | Status: DC | PRN
  Administered 2024-04-30: 2 mg via INTRAVENOUS

## 2024-04-30 MED ORDER — PHENYLEPHRINE 80 MCG/ML (10ML) SYRINGE FOR IV PUSH (FOR BLOOD PRESSURE SUPPORT)
PREFILLED_SYRINGE | INTRAVENOUS | Status: DC | PRN
  Administered 2024-04-30 (×3): 80 ug via INTRAVENOUS

## 2024-04-30 MED ORDER — ONDANSETRON HCL 4 MG/2ML IJ SOLN
INTRAMUSCULAR | Status: AC
  Filled 2024-04-30: qty 2

## 2024-04-30 MED ORDER — 0.9 % SODIUM CHLORIDE (POUR BTL) OPTIME
TOPICAL | Status: DC | PRN
  Administered 2024-04-30: 500 mL

## 2024-04-30 MED ORDER — LIDOCAINE HCL (CARDIAC) PF 100 MG/5ML IV SOSY
PREFILLED_SYRINGE | INTRAVENOUS | Status: DC | PRN
  Administered 2024-04-30: 100 mg via INTRAVENOUS

## 2024-04-30 MED ORDER — SENNA 8.6 MG PO TABS
1.0000 | ORAL_TABLET | Freq: Two times a day (BID) | ORAL | 0 refills | Status: DC | PRN
  Filled 2024-04-30: qty 30, 15d supply, fill #0

## 2024-04-30 SURGICAL SUPPLY — 39 items
BELT PT INTERSTIM MICRO SYSTEM (MISCELLANEOUS) IMPLANT
BLADE BOVIE TIP EXT 4 (BLADE) IMPLANT
BUR NEURO DRILL SOFT 3.0X3.8M (BURR) ×1 IMPLANT
CONTROLLER HANDSET COMM KIT (NEUROSURGERY SUPPLIES) IMPLANT
DERMABOND ADVANCED .7 DNX12 (GAUZE/BANDAGES/DRESSINGS) ×2 IMPLANT
DRAPE C ARM PK CFD 31 SPINE (DRAPES) ×1 IMPLANT
DRAPE C-ARM 42X72 X-RAY (DRAPES) IMPLANT
DRAPE C-ARM XRAY 36X54 (DRAPES) IMPLANT
DRAPE LAPAROTOMY 100X77 ABD (DRAPES) ×1 IMPLANT
DRSG OPSITE POSTOP 4X6 (GAUZE/BANDAGES/DRESSINGS) IMPLANT
DRSG OPSITE POSTOP 4X8 (GAUZE/BANDAGES/DRESSINGS) IMPLANT
ELECTRODE REM PT RTRN 9FT ADLT (ELECTROSURGICAL) ×1 IMPLANT
FEE INTRAOP CADWELL SUPPLY NCS (MISCELLANEOUS) IMPLANT
FEE INTRAOP MONITOR IMPULS NCS (MISCELLANEOUS) IMPLANT
GLOVE BIOGEL PI IND STRL 6.5 (GLOVE) ×1 IMPLANT
GLOVE SURG SYN 6.5 PF PI (GLOVE) ×1 IMPLANT
GLOVE SURG SYN 8.5 PF PI (GLOVE) ×3 IMPLANT
GOWN SRG LRG LVL 4 IMPRV REINF (GOWNS) ×1 IMPLANT
GOWN SRG XL LVL 3 NONREINFORCE (GOWNS) ×1 IMPLANT
KIT SPINAL PRONEVIEW (KITS) ×1 IMPLANT
LAVAGE JET IRRISEPT WOUND (IRRIGATION / IRRIGATOR) ×1 IMPLANT
MANIFOLD NEPTUNE II (INSTRUMENTS) ×1 IMPLANT
MARKER SKIN DUAL TIP RULER LAB (MISCELLANEOUS) ×1 IMPLANT
NDL SAFETY ECLIP 18X1.5 (MISCELLANEOUS) ×1 IMPLANT
NEUROSTIM INCEPTIV (Neuro Prosthesis/Implant) IMPLANT
NS IRRIG 500ML POUR BTL (IV SOLUTION) ×1 IMPLANT
PACK LAMINECTOMY ARMC (PACKS) ×1 IMPLANT
PROGRAMMER AND COMM CASE (NEUROSURGERY SUPPLIES) IMPLANT
RECHARGER SYSTEM (NEUROSURGERY SUPPLIES) IMPLANT
STAPLER SKIN PROX 35W (STAPLE) ×1 IMPLANT
STIMULATOR CORD SURESCAN MRI (Stimulator) IMPLANT
SURGIFLO W/THROMBIN 8M KIT (HEMOSTASIS) ×1 IMPLANT
SUT SILK 2 0SH CR/8 30 (SUTURE) ×1 IMPLANT
SUT STRATA 3-0 15 PS-2 (SUTURE) ×2 IMPLANT
SUT VIC AB 0 CT1 18XCR BRD 8 (SUTURE) ×1 IMPLANT
SUT VIC AB 2-0 CT1 18 (SUTURE) ×1 IMPLANT
SYR 10ML LL (SYRINGE) IMPLANT
SYR 30ML LL (SYRINGE) ×2 IMPLANT
TRAP FLUID SMOKE EVACUATOR (MISCELLANEOUS) ×1 IMPLANT

## 2024-04-30 NOTE — Discharge Instructions (Addendum)
 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 Increase protein intake to promote wound healing.  Recommended activity: no lifting, driving, or strenuous exercise for 4 weeks. You should walk multiple times per day  Special Instructions  No straining, no heavy lifting > 10lbs x 4 weeks.  Keep incision area clean and dry. May shower in 2 days. No baths or pools for 6 weeks.  Please remove dressing tomorrow, no need to apply a bandage afterwards  You have no sutures to remove, the skin is closed with adhesive  Please take pain medications as directed. Take a stool softener if on pain medications  *Regarding compression stockings-  Please wear day and night until you are walking a couple hundred feet three times a day.   Please Report any of the following: Nausea or Vomiting, Temperature is greater than 101.41F (38.1C) degrees, Dizziness, Abdominal Pain, Difficulty Breathing or Shortness of Breath, Inability to Eat, drink Fluids, or Take medications, Bleeding, swelling, or drainage from surgical incision sites, New numbness or weakness, and Bowel or bladder dysfunction to the neurosurgeon on call. How to contact us :  If you have any questions/concerns before or after surgery, you can reach us  at [phone], or you can send a mychart message. We can be reached by phone or mychart 8am-4pm, Monday-Friday.  *Please note: Calls after 4pm are forwarded to a third party answering service. Mychart messages are not routinely monitored during evenings, weekends, and holidays. Please call our office to contact the answering service for urgent concerns during non-business hours.   Additional Follow up appointments Please follow up with Lyle Decamp PA-C as scheduled in 2-3 weeks   Please see below for scheduled  appointments:  Future Appointments  Date Time Provider Department Center  05/13/2024 11:00 AM Decamp Lyle, PA-C [facility]  05/30/2024  9:30 AM Delores Orvin BRAVO, NP [facility] None  06/05/2024 10:30 AM Clois Fret, MD [facility]

## 2024-04-30 NOTE — Op Note (Signed)
 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 The patient had a successful trial for spinal cord stimulation, so was consented for placement of a permanent device   Findings: successful placement of a [employer] spinal cord stimulator.   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     Preoperative Note:    Risks of surgery discussed in clinic.   Operative Note:      The patient was then brought from the preoperative center with intravenous access established.  The patient underwent general anesthesia and endotracheal tube intubation, then was rotated on the [facility] table where all pressure points were appropriately padded.  An incision was marked with flouroscopy at T9/10, and on the left flank. The skin was then thoroughly cleansed.  Perioperative antibiotic prophylaxis was administered.  Sterile prep and drapes were then applied and a timeout was then observed.     Once this was complete an incision was opened with the use of a #10 blade knife in the midline at the thoracic incision.  The paraspinus muscled were subperiosteally dissected until the laminae of T9 and T10 were visualized. Flouroscopy was used to confirm the level. A self-retaining retractor was placed.   The rongeur was used to remove the spinous process of T9.  The drill was used to thin the bone until the ligamentum flavum was visualized.  The ligamentum was then removed and the dura visualized. This was widened until placement of the paddle lead was possible.     The lead was then advanced to the T7/8 disc space at the top of the lead.  The lead was secured with a 2-0 silk suture.     The incision on the flank was then opened and a pocket formed until it was large  enough for the pulse generator.  The tunneler was used to connect between the pocket and the incision.  The lead was inserted into the tunneler and tunneled to the flank.  The leads were attached to the IPG and impedances checked.  The leads were then tightened.  The IPG was then inserted into the pouch.   Both sites were irrigated.  The wounds were closed in layers with 0 and 2-0 vicryl.  The skin was approximated with monocryl. A sterile dressing was applied.   Monitoring was stable throughout.   Patient was then rotated back to the preoperative bed awakened from anesthesia and taken to recovery. All counts are correct in this case.   I performed the entire procedure with the assistance of Edsel Goods PA as an designer, television/film set. An assistant was required for this procedure due to the complexity.  The assistant provided assistance in tissue manipulation and suction, and was required for the successful and safe performance of the procedure. I performed the critical portions of the procedure.      Reeves Daisy MD

## 2024-04-30 NOTE — Anesthesia Postprocedure Evaluation (Signed)
 Anesthesia Post Note  Patient: Karen [name]  Procedure(s) Performed: THORACIC LAMINECTOMY FOR SPINAL CORD STIMULATOR (Spine Thoracic)  Patient location during evaluation: PACU Anesthesia Type: General Level of consciousness: awake and alert Pain management: pain level controlled Vital Signs Assessment: post-procedure vital signs reviewed and stable Respiratory status: spontaneous breathing, nonlabored ventilation, respiratory function stable and patient connected to nasal cannula oxygen Cardiovascular status: blood pressure returned to baseline and stable Postop Assessment: no apparent nausea or vomiting Anesthetic complications: no   No notable events documented.   Last Vitals:  Vitals:   04/30/24 1025 04/30/24 1032  BP:  136/88  Pulse: 79 86  Resp: 10 16  Temp:  (!) 36.3 C  SpO2: 95% 95%    Last Pain:  Vitals:   04/30/24 1032  TempSrc: Temporal  PainSc: 0-No pain                 Lynwood KANDICE Clause

## 2024-04-30 NOTE — H&P (Signed)
 REFERRING PHYSICIAN:  Clois Fret, Md [address] [place],  [place] [address]  DOS: 10/23/22  Right L4-5 lumbar decompression including central laminectomy and bilateral medial facetectomies including foraminotomies   HISTORY OF PRESENT ILLNESS: 04/30/2024 Karen [name] is here today for placement of a spinal cord stimulator.  03/20/2024 Karen [name] returns to see me after a spinal cord stimulator evaluation.  She had approximately 65% improvement in her symptoms.  Additionally, she was able to perform several more daily tasks than previously.  It substantially helped her in her activities of daily living.   12/05/2022 Karen [name] is status post Right L4-5 lumbar decompression.  She is having some increasing discomfort in her back and down her legs.  It is similar to that prior to surgery.  She is having some swelling in her right lower leg.  She has history of low deep venous thrombosis.  Family History  Problem Relation Age of Onset   Cancer Mother    Hypertension Mother    Hypertension Father    Heart disease Father    Emphysema Father    Depression Father    Cancer Sister    Hypertension Sister    Social History   Socioeconomic History   Marital status: Married    Spouse name: Not on file   Number of children: Not on file   Years of education: Not on file   Highest education level: Not on file  Occupational History   Not on file  Tobacco Use   Smoking status: Never   Smokeless tobacco: Never  Vaping Use   Vaping status: Never Used  Substance and Sexual Activity   Alcohol use: Yes    Alcohol/week: 5.0 standard drinks of alcohol    Types: 5 Shots of liquor per week   Drug use: No   Sexual activity: Not on file  Other Topics Concern   Not on file  Social History Narrative   Not on file   Social Drivers of Health   Financial Resource Strain: Low Risk  (04/23/2023)   Received from [facility]   Overall  Financial Resource Strain (CARDIA)    Difficulty of Paying Living Expenses: Not hard at all  Food Insecurity: No Food Insecurity (04/23/2023)   Received from [facility]   Hunger Vital Sign    Within the past 12 months, you worried that your food would run out before you got the money to buy more.: Never true    Within the past 12 months, the food you bought just didn't last and you didn't have money to get more.: Never true  Transportation Needs: No Transportation Needs (04/23/2023)   Received from [facility]    In the past 12 months, has lack of transportation kept you from medical appointments or from getting medications?: No    Lack of Transportation (Non-Medical): No  Physical Activity: Not on file  Stress: Not on file  Social Connections: Not on file   Current Meds  Medication Sig   DULoxetine  (CYMBALTA ) 20 MG capsule Take 20 mg by mouth in the morning.   levothyroxine (SYNTHROID, LEVOTHROID) 75 MCG tablet Take 75 mcg by mouth daily before breakfast.   lisinopril (PRINIVIL,ZESTRIL) 20 MG tablet Take 20 mg by mouth every morning.   meloxicam  (MOBIC ) 15 MG tablet Take 15 mg by mouth in the morning.   nystatin ointment (MYCOSTATIN) Apply 1 Application topically 2 (two) times  daily as needed (rash (skin folds)).   pramipexole (MIRAPEX) 1 MG tablet Take 1 mg by mouth 2 (two) times daily.   pregabalin (LYRICA) 100 MG capsule Take 100 mg by mouth 2 (two) times daily.   Probiotic Product (PROBIOTIC PEARLS WOMENS PO) Take 1 capsule by mouth in the morning.   rosuvastatin (CRESTOR) 10 MG tablet Take 10 mg by mouth in the morning.   saccharomyces boulardii (FLORASTOR) 250 MG capsule Take 1 capsule (250 mg total) by mouth 2 (two) times daily for 25 days.   trospium (SANCTURA) 20 MG tablet Take 20 mg by mouth 2 (two) times daily.   No Known Allergies  PHYSICAL EXAMINATION:  Vitals:   04/30/24 0617  BP: (!) 140/86  Pulse: 88   Resp: 18  Temp: (!) 97.4 F (36.3 C)  SpO2: 98%    General: Patient is well developed, well nourished, calm, collected, and in no apparent distress.  Heart sounds normal no MRG. Chest Clear to Auscultation Bilaterally.   NEUROLOGICAL:  General: In no acute distress.   Awake, alert, oriented to person, place, and time.  Pupils equal round and reactive to light.  Facial tone is symmetric.     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  She has had a successful spinal cord stimulator evaluation trial.  She is an excellent candidate for placement.    We will place a medtronic spinal cord stimulator today.   Reeves Daisy Department of neurosurgery

## 2024-04-30 NOTE — Transfer of Care (Signed)
 Immediate Anesthesia Transfer of Care Note  Patient: Karen [name]  Procedure(s) Performed: THORACIC LAMINECTOMY FOR SPINAL CORD STIMULATOR (Spine Thoracic)  Patient Location: PACU  Anesthesia Type:General  Level of Consciousness: drowsy and patient cooperative  Airway & Oxygen Therapy: Patient Spontanous Breathing and Patient connected to face mask  Post-op Assessment: Report given to RN, Post -op Vital signs reviewed and stable, and Patient moving all extremities  Post vital signs: Reviewed and stable  Last Vitals:  Vitals Value Taken Time  BP 106/60 04/30/24 09:28  Temp    Pulse 85 04/30/24 09:35  Resp 13 04/30/24 09:35  SpO2 98 % 04/30/24 09:35  Vitals shown include unfiled device data.  Last Pain:  Vitals:   04/30/24 0617  TempSrc: Temporal  PainSc: 4          Complications: No notable events documented.

## 2024-04-30 NOTE — Anesthesia Preprocedure Evaluation (Signed)
 Anesthesia Evaluation  Patient identified by MRN, date of birth, ID band Patient awake    Reviewed: Allergy & Precautions, H&P , NPO status , Patient's Chart, lab work & pertinent test results, reviewed documented beta blocker date and time   Airway Mallampati: II  TM Distance: >3 FB Neck ROM: full    Dental  (+) Teeth Intact   Pulmonary sleep apnea and Continuous Positive Airway Pressure Ventilation    Pulmonary exam normal        Cardiovascular Exercise Tolerance: Poor hypertension, On Medications Normal cardiovascular exam+ Valvular Problems/Murmurs  Rhythm:regular Rate:Normal     Neuro/Psych  Headaches PSYCHIATRIC DISORDERS  Depression     Neuromuscular disease    GI/Hepatic Neg liver ROS,GERD  Medicated,,  Endo/Other  Hypothyroidism    Renal/GU negative Renal ROS  negative genitourinary   Musculoskeletal   Abdominal   Peds  Hematology negative hematology ROS (+)   Anesthesia Other Findings Past Medical History: No date: Acquired trigger finger of both middle fingers No date: Arthritis 2011: Breast cancer, right (HCC)     Comment:  Right Lumpectomy and Rad tx's. 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, post-menopausal No date: Pre-diabetes No date: RLS (restless legs syndrome) No date: Sleep apnea     Comment:  not using cpap machine-feels like she is drowning Past Surgical History: No date: ABDOMINAL HYSTERECTOMY No date: APPENDECTOMY No date: BREAST SURGERY 11/25/2014: CARDIAC CATHETERIZATION; N/A     Comment:  Procedure: Left Heart Cath;  Surgeon:  Vinie DELENA Jude,               MD;  Location: [facility];  Service:               Cardiovascular;  Laterality: N/A; 05/08/2023: CARPAL TUNNEL RELEASE; Right     Comment:  Procedure: RIGHT CARPAL TUNNEL RELEASE WITH ULTRASOUND               GUIDANCE;  Surgeon: Claudene Penne ORN, MD;  Location: [facility]              ORS;  Service: Neurosurgery;  Laterality: Right; No date: CHOLECYSTECTOMY 12/24/2023: COLONOSCOPY; N/A     Comment:  Procedure: COLONOSCOPY;  Surgeon: Maryruth Ole DASEN,               MD;  Location: [facility] ENDOSCOPY;  Service: Endoscopy;                Laterality: N/A; 08/05/2018: COLONOSCOPY WITH PROPOFOL ; N/A     Comment:  Procedure: COLONOSCOPY WITH PROPOFOL ;  Surgeon:               Gaylyn Gladis PENNER, MD;  Location: [facility] ENDOSCOPY;                Service: Endoscopy;  Laterality: N/A; 10/23/2022: FORAMINOTOMY 1 LEVEL; Right     Comment:  Procedure: RIGHT L4-5 LAMINOFORAMINOTOMY;  Surgeon:               Clois Fret, MD;  Location: [facility] ORS;  Service:               Neurosurgery;  Laterality: Right; No date: MASTECTOMY; Right  Comment:  partial/lumpectomy 12/24/2023: POLYPECTOMY     Comment:  Procedure: POLYPECTOMY, INTESTINE;  Surgeon: Maryruth Ole DASEN, MD;  Location: [facility] ENDOSCOPY;  Service:               Endoscopy;;   Reproductive/Obstetrics negative OB ROS                              Anesthesia Physical Anesthesia Plan  ASA: 3  Anesthesia Plan: General ETT   Post-op Pain Management:    Induction:   PONV Risk Score and Plan: 4 or greater  Airway Management Planned:   Additional Equipment:   Intra-op Plan:   Post-operative Plan:   Informed Consent: I have reviewed the patients History and Physical, chart, labs and discussed the procedure including the risks, benefits and alternatives for the proposed anesthesia with the patient or authorized representative who has indicated his/her understanding and  acceptance.     Dental Advisory Given  Plan Discussed with: CRNA  Anesthesia Plan Comments:         Anesthesia Quick Evaluation

## 2024-05-01 ENCOUNTER — Encounter: Payer: Self-pay | Admitting: Neurosurgery

## 2024-05-02 ENCOUNTER — Encounter: Payer: Self-pay | Admitting: Neurosurgery

## 2024-05-02 NOTE — Telephone Encounter (Signed)
 Also, spoke with patient. She is also taking Cymbalta  20 mg 1 daily, Robaxin  500 mg 1 four times daily, Lyrica 100 mg 1 daily. No fever, no drainage from the incision. She states she has a high tolerance for pain but not sleeping through the night with the pain is hard on her with the pain been intense.

## 2024-05-02 NOTE — Telephone Encounter (Signed)
 Sorry I did not include this but she is not taking Meloxicam 

## 2024-05-13 ENCOUNTER — Encounter: Payer: Self-pay | Admitting: Physician Assistant

## 2024-05-13 ENCOUNTER — Ambulatory Visit: Admitting: Physician Assistant

## 2024-05-13 VITALS — BP 128/80 | Temp 98.1°F | Ht 68.0 in | Wt 246.0 lb

## 2024-05-13 DIAGNOSIS — Z09 Encounter for follow-up examination after completed treatment for conditions other than malignant neoplasm: Secondary | ICD-10-CM

## 2024-05-13 DIAGNOSIS — Z9689 Presence of other specified functional implants: Secondary | ICD-10-CM

## 2024-05-13 DIAGNOSIS — M961 Postlaminectomy syndrome, not elsewhere classified: Secondary | ICD-10-CM

## 2024-05-13 MED ORDER — CYCLOBENZAPRINE HCL 10 MG PO TABS: 10.0000 mg | ORAL_TABLET | Freq: Three times a day (TID) | ORAL | 0 refills | Status: DC | PRN

## 2024-05-13 NOTE — Progress Notes (Signed)
   REFERRING PHYSICIAN:  Claudia Charleston, Md [address] [place],  [place] [address]  DOS: 04/30/24, thoracic lami for SCS  HISTORY OF PRESENT ILLNESS: Karen [name] is approximately 2 weeks status post thoracic laminectomy for spinal cord stimulator placement.  She is having some pain in her mid back when she moves a certain way that causes a sharp pain which is then relieved when she returns to normal positioning.  It does not radiate to her ribs but it is sharp in nature.  She denies any pain down her legs or any numbness or tingling or new weakness.    PHYSICAL EXAMINATION:  General: Patient is well developed, well nourished, calm, collected, and in no apparent distress.   NEUROLOGICAL:  General: In no acute distress.   Awake, alert, oriented to person, place, and time.  Pupils equal round and reactive to light.  Facial tone is symmetric.     Strength:            Side Iliopsoas Quads Hamstring PF DF EHL  R 5 5 5 5 5 5   L 5 5 5 5 5 5    Incisions c/d/i   ROS (Neurologic):  Negative except as noted above  IMAGING: No interval imaging  ASSESSMENT/PLAN:  Karen [name] is doing well approximately 2 weeks after spinal cord stimulator placement. she will follow up in approximately 1 month for 6-week postop visit.  Plan to change muscle relaxer to Flexeril  in hopes to better help some of her pain.  She was advised to contact me to keep it that on how this works for her.  Spinal cord stimulator rep was present during her appointment today to help optimize this for her as well.  I have advised the patient to lift up to 10 pounds until 6 weeks after surgery, then increase up to 25 pounds until 12 weeks after surgery.  After 12 weeks post-op, the patient advised to increase activity as tolerated.  Advised to contact the office if any questions or concerns arise.  Lyle Decamp PA-C Department of neurosurgery

## 2024-05-19 ENCOUNTER — Telehealth: Payer: Self-pay

## 2024-05-19 NOTE — Telephone Encounter (Signed)
 Patient states she went to ED around 6:30 last night. She said that they could not do an ultrasound on her leg, because it was not available at night. States CT chest was negative.   She went back this morning to get an ultrasound of her leg, which she states showed no blood clots.  States that she was told it might be vascular and that she has an appt next week with Fallon Brown.

## 2024-05-27 ENCOUNTER — Encounter: Admitting: Neurosurgery

## 2024-05-28 LAB — GENECONNECT MOLECULAR SCREEN: Genetic Analysis Overall Interpretation: NEGATIVE

## 2024-05-30 ENCOUNTER — Ambulatory Visit (INDEPENDENT_AMBULATORY_CARE_PROVIDER_SITE_OTHER): Admitting: Nurse Practitioner

## 2024-05-30 ENCOUNTER — Encounter (INDEPENDENT_AMBULATORY_CARE_PROVIDER_SITE_OTHER): Payer: Self-pay | Admitting: Nurse Practitioner

## 2024-05-30 VITALS — BP 126/84 | HR 88 | Resp 18 | Wt 248.5 lb

## 2024-05-30 DIAGNOSIS — I872 Venous insufficiency (chronic) (peripheral): Secondary | ICD-10-CM | POA: Diagnosis not present

## 2024-05-30 DIAGNOSIS — I10 Essential (primary) hypertension: Secondary | ICD-10-CM | POA: Diagnosis not present

## 2024-06-01 ENCOUNTER — Encounter (INDEPENDENT_AMBULATORY_CARE_PROVIDER_SITE_OTHER): Payer: Self-pay

## 2024-06-05 ENCOUNTER — Ambulatory Visit: Admitting: Neurosurgery

## 2024-06-05 VITALS — BP 134/82 | Temp 98.1°F | Ht 68.0 in | Wt 247.0 lb

## 2024-06-05 DIAGNOSIS — M961 Postlaminectomy syndrome, not elsewhere classified: Secondary | ICD-10-CM

## 2024-06-05 DIAGNOSIS — Z09 Encounter for follow-up examination after completed treatment for conditions other than malignant neoplasm: Secondary | ICD-10-CM

## 2024-06-05 DIAGNOSIS — Z9689 Presence of other specified functional implants: Secondary | ICD-10-CM

## 2024-06-05 NOTE — Progress Notes (Signed)
° °  REFERRING PHYSICIAN:  Claudia Charleston, Md [address] [place],  [place] [address]  DOS: 04/30/24, thoracic lami for SCS  HISTORY OF PRESENT ILLNESS: SAMON DISHNER is status post thoracic laminectomy for spinal cord stimulator placement.    She is doing well her pain is approximately 90% better.  She has some nighttime tightness in her feet.    PHYSICAL EXAMINATION:  General: Patient is well developed, well nourished, calm, collected, and in no apparent distress.   NEUROLOGICAL:  General: In no acute distress.   Awake, alert, oriented to person, place, and time.  Pupils equal round and reactive to light.  Facial tone is symmetric.     Strength:            Side Iliopsoas Quads Hamstring PF DF EHL  R 5 5 5 5 5 5   L 5 5 5 5 5 5    Incisions c/d/i   ROS (Neurologic):  Negative except as noted above  IMAGING: No interval imaging  ASSESSMENT/PLAN:  JAIYAH BEINING is doing well after spinal cord stimulator placement.   I am very pleased with her response.  Will see her back on an as-needed basis.  The rep will work on her with programming.  Reeves Daisy MD Department of neurosurgery

## 2024-06-08 ENCOUNTER — Encounter (INDEPENDENT_AMBULATORY_CARE_PROVIDER_SITE_OTHER): Payer: Self-pay | Admitting: Nurse Practitioner

## 2024-06-08 NOTE — Progress Notes (Signed)
 "  Subjective:    Patient ID: Karen [name], female    DOB: August 14, 1950, 73 y.o.   MRN: [id number] Chief Complaint  Patient presents with   Follow-up    3 month follow up    HPI  Discussed the use of AI scribe software for clinical note transcription with the patient, who gave verbal consent to proceed.  History of Present Illness Karen [name] is a 73 year old female with venous insufficiency who presents with right leg swelling and discomfort. She was referred by her family doctor for evaluation of her leg swelling.  She recently received a spinal stimulator, which has improved her nerve-related symptoms. However, she experienced swelling in her right leg, prompting a visit to the emergency room where a blood clot was initially suspected but ruled out. She was subsequently seen by her family doctor, who prescribed antibiotics, though the swelling persisted.  The swelling in her right leg has been present for at least a week since her hospital visit. The leg is larger than the other, with pain and a sensation of 'lumps'. It feels heavy and experiences aching and throbbing, particularly when sitting or standing for extended periods. The discomfort affects her sleep, as she wakes up due to the pain.  She has a history of venous reflux, particularly in the right leg, identified in a study conducted about a year ago. She has been using compression stockings, but they have become too tight due to the increased swelling, causing discomfort and leaving marks on her leg. No falls or trauma to the leg.  No recent falls or injuries to the leg. Pain is exacerbated by prolonged sitting and standing, with a sensation of pressure, as if the leg is 'a balloon at its limit'. Leg pain persists at night.    Results RADIOLOGY Knee CT: Joint effusion  DIAGNOSTIC Venous ultrasound: No deep vein thrombosis or obstruction in left or right leg (08/01/2023) venous US .  The patient has venous reflux  noted bilaterally with partial reflux noted in the right lower extremity.  The patient had an ABI 1.34 on the right and 1.20 on the left with triphasic tibial waveforms bilaterally and normal TBI's bilaterally.   Review of Systems  Cardiovascular:  Positive for leg swelling.  Musculoskeletal:  Positive for gait problem and joint swelling.  All other systems reviewed and are negative.      Objective:   Physical Exam Vitals reviewed.  HENT:     Head: Normocephalic.  Cardiovascular:     Rate and Rhythm: Normal rate.  Pulmonary:     Effort: Pulmonary effort is normal.  Musculoskeletal:     Right lower leg: Edema present.     Left lower leg: Edema present.  Skin:    General: Skin is warm and dry.  Neurological:     Mental Status: She is alert and oriented to person, place, and time.  Psychiatric:        Mood and Affect: Mood normal.        Behavior: Behavior normal.        Thought Content: Thought content normal.        Judgment: Judgment normal.     Physical Exam EXTREMITIES: Pain on palpation of the right leg.  BP 126/84 (BP Location: Left Arm)   Pulse 88   Resp 18   Wt 248 lb 8 oz (112.7 kg)   BMI 37.78 kg/m   Past Medical History:  Diagnosis Date   Acquired trigger finger  of both middle fingers    Arthritis    Breast cancer, right (HCC) 2011   Right Lumpectomy and Rad tx's.   Depression    Diverticulitis    DVT (deep venous thrombosis) (HCC)    leg   GERD (gastroesophageal reflux disease)    Heart murmur    Heart palpitations    Hyperlipidemia    Hypertension    Hypothyroidism    Leg edema    Narcolepsy and cataplexy    Neuropathy    Obesity    Osteoporosis, post-menopausal    Pre-diabetes    RLS (restless legs syndrome)    Sleep apnea    not using cpap machine-feels like she is drowning    Social History   Socioeconomic History   Marital status: Married    Spouse name: Not on file   Number of children: Not on file   Years of education: Not  on file   Highest education level: Not on file  Occupational History   Not on file  Tobacco Use   Smoking status: Never   Smokeless tobacco: Never  Vaping Use   Vaping status: Never Used  Substance and Sexual Activity   Alcohol use: Yes    Alcohol/week: 5.0 standard drinks of alcohol    Types: 5 Shots of liquor per week   Drug use: No   Sexual activity: Not on file  Other Topics Concern   Not on file  Social History Narrative   Not on file   Social Drivers of Health   Tobacco Use: Low Risk (06/05/2024)   Patient History    Smoking Tobacco Use: Never    Smokeless Tobacco Use: Never    Passive Exposure: Not on file  Financial Resource Strain: Low Risk  (04/23/2023)   Received from [facility]   Overall Financial Resource Strain (CARDIA)    Difficulty of Paying Living Expenses: Not hard at all  Food Insecurity: No Food Insecurity (04/23/2023)   Received from [facility]   Epic    Within the past 12 months, you worried that your food would run out before you got the money to buy more.: Never true    Within the past 12 months, the food you bought just didn't last and you didn't have money to get more.: Never true  Transportation Needs: No Transportation Needs (04/23/2023)   Received from [facility] - Transportation    In the past 12 months, has lack of transportation kept you from medical appointments or from getting medications?: No    Lack of Transportation (Non-Medical): No  Physical Activity: Not on file  Stress: Not on file  Social Connections: Not on file  Intimate Partner Violence: Not on file  Depression (PHQ2-9): Low Risk (03/10/2024)   Depression (PHQ2-9)    PHQ-2 Score: 0  Alcohol Screen: Not on file  Housing: Low Risk  (07/31/2023)   Received from [facility]   Epic    In the last 12 months, was there a time when you were not able to pay the mortgage or rent on time?: No    In the  past 12 months, how many times have you moved where you were living?: 0    At any time in the past 12 months, were you homeless or living in a shelter (including now)?: No  Utilities: Not At Risk (04/23/2023)   Received from [facility]   [facility] Utilities  Threatened with loss of utilities: No  Health Literacy: Not on file    Past Surgical History:  Procedure Laterality Date   ABDOMINAL HYSTERECTOMY     APPENDECTOMY     BREAST SURGERY     CARDIAC CATHETERIZATION N/A 11/25/2014   Procedure: Left Heart Cath;  Surgeon: Vinie DELENA Jude, MD;  Location: [facility] INVASIVE CV LAB;  Service: Cardiovascular;  Laterality: N/A;   CARPAL TUNNEL RELEASE Right 05/08/2023   Procedure: RIGHT CARPAL TUNNEL RELEASE WITH ULTRASOUND GUIDANCE;  Surgeon: Claudene Penne ORN, MD;  Location: [facility] ORS;  Service: Neurosurgery;  Laterality: Right;   CHOLECYSTECTOMY     COLONOSCOPY N/A 12/24/2023   Procedure: COLONOSCOPY;  Surgeon: Maryruth Ole DASEN, MD;  Location: [facility] ENDOSCOPY;  Service: Endoscopy;  Laterality: N/A;   COLONOSCOPY WITH PROPOFOL  N/A 08/05/2018   Procedure: COLONOSCOPY WITH PROPOFOL ;  Surgeon: Gaylyn Gladis PENNER, MD;  Location: [facility] ENDOSCOPY;  Service: Endoscopy;  Laterality: N/A;   FORAMINOTOMY 1 LEVEL Right 10/23/2022   Procedure: RIGHT L4-5 LAMINOFORAMINOTOMY;  Surgeon: Clois Fret, MD;  Location: [facility] ORS;  Service: Neurosurgery;  Laterality: Right;   MASTECTOMY Right    partial/lumpectomy   POLYPECTOMY  12/24/2023   Procedure: POLYPECTOMY, INTESTINE;  Surgeon: Maryruth Ole DASEN, MD;  Location: [facility] ENDOSCOPY;  Service: Endoscopy;;   THORACIC LAMINECTOMY FOR SPINAL CORD STIMULATOR N/A 04/30/2024   Procedure: THORACIC LAMINECTOMY FOR SPINAL CORD STIMULATOR;  Surgeon: Clois Fret, MD;  Location: [facility] ORS;  Service: Neurosurgery;  Laterality: N/A;  Thoracic Laminectomy for Spinal Cord Stimulator Placement    Family History  Problem Relation Age of Onset   Cancer Mother     Hypertension Mother    Hypertension Father    Heart disease Father    Emphysema Father    Depression Father    Cancer Sister    Hypertension Sister     Allergies[1]     Latest Ref Rng & Units 02/05/2015    4:03 PM  CBC  WBC 3.6 - 11.0 K/uL 8.2   Hemoglobin 12.0 - 16.0 g/dL 86.8   Hematocrit 64.9 - 47.0 % 40.2   Platelets 150 - 440 K/uL 184       CMP     Component Value Date/Time   NA 136 10/06/2022 1150   K 3.6 10/06/2022 1150   CL 100 10/06/2022 1150   CO2 29 10/06/2022 1150   GLUCOSE 93 10/06/2022 1150   BUN 11 10/06/2022 1150   CREATININE 0.57 10/06/2022 1150   CALCIUM 9.1 10/06/2022 1150   GFRNONAA >60 10/06/2022 1150     No results found.     Assessment & Plan:   1. Chronic venous insufficiency (Primary) Chronic venous insufficiency, right lower extremity Chronic venous insufficiency in the right lower extremity with exacerbated symptoms. Previous studies showed significant reflux in the right leg. Differential diagnosis includes venous insufficiency, ruptured Baker's cyst, and hematoma, with the latter two being less likely. Symptoms suggest worsening venous insufficiency. Conservative management with compression stockings has been limited. - Ordered repeat ultrasound of the right lower extremity to assess changes in venous insufficiency. - Recommended obtaining new compression stockings to accommodate increased swelling. - Discussed potential for endovenous laser ablation if venous insufficiency is confirmed to have worsened. - Advised to discuss symptoms with Dr. Clois to rule out contributions from spinal stimulator or other factors.  2. Primary hypertension Continue antihypertensive medications as already ordered, these medications have been reviewed and there are no changes at this time.   Assessment and Plan Assessment &  Plan      Medications Ordered Prior to Encounter[2]  There are no Patient Instructions on file for this visit. No  follow-ups on file.   Denzil Bristol E York Valliant, NP      [1] No Known Allergies [2]  Current Outpatient Medications on File Prior to Visit  Medication Sig Dispense Refill   DULoxetine  (CYMBALTA ) 20 MG capsule Take 20 mg by mouth in the morning.     levothyroxine (SYNTHROID, LEVOTHROID) 75 MCG tablet Take 75 mcg by mouth daily before breakfast.     lisinopril (PRINIVIL,ZESTRIL) 20 MG tablet Take 20 mg by mouth every morning.  3   meloxicam  (MOBIC ) 15 MG tablet Take 15 mg by mouth in the morning.     pramipexole (MIRAPEX) 1 MG tablet Take 1 mg by mouth 2 (two) times daily.     pregabalin (LYRICA) 100 MG capsule Take 100 mg by mouth 2 (two) times daily.     Probiotic Product (PROBIOTIC PEARLS WOMENS PO) Take 1 capsule by mouth in the morning.     trospium (SANCTURA) 20 MG tablet Take 20 mg by mouth 2 (two) times daily.     No current facility-administered medications on file prior to visit.   "

## 2024-07-02 ENCOUNTER — Other Ambulatory Visit (INDEPENDENT_AMBULATORY_CARE_PROVIDER_SITE_OTHER): Payer: Self-pay | Admitting: Nurse Practitioner

## 2024-07-02 ENCOUNTER — Encounter (INDEPENDENT_AMBULATORY_CARE_PROVIDER_SITE_OTHER): Payer: Self-pay | Admitting: Vascular Surgery

## 2024-07-02 DIAGNOSIS — I872 Venous insufficiency (chronic) (peripheral): Secondary | ICD-10-CM | POA: Insufficient documentation

## 2024-07-02 NOTE — Progress Notes (Signed)
 "        MRN : [id number]  Karen [name] is a 74 y.o. (06/27/1950) female who presents with chief complaint of legs hurt.  History of Present Illness:   Patient returns to the office for evaluation of possible vascular etiologies to her leg pain. Location: From the knees down bilaterally right perhaps slightly worse than left Character/quality of the symptom: Burning electrical pulsating Severity: Very severe it is keeping her from sleeping at night Duration: Waxes and wanes but appears to be worse at night when she is lying down Timing/onset: Daily Aggravating/context: Laying down, having blankets over her legs exacerbates the symptoms but not having any cover over her legs makes them feel like ice Relieving/modifying: Minimal  Duplex ultrasound of the venous system bilaterally demonstrates reflux over a short segment of the mid great saphenous vein on the right otherwise the study is normal bilaterally  Active Medications[1]  Past Medical History:  Diagnosis Date   Acquired trigger finger of both middle fingers    Arthritis    Breast cancer, right (HCC) 2011   Right Lumpectomy and Rad tx's.   Depression    Diverticulitis    DVT (deep venous thrombosis) (HCC)    leg   GERD (gastroesophageal reflux disease)    Heart murmur    Heart palpitations    Hyperlipidemia    Hypertension    Hypothyroidism    Leg edema    Narcolepsy and cataplexy    Neuropathy    Obesity    Osteoporosis, post-menopausal    Pre-diabetes    RLS (restless legs syndrome)    Sleep apnea    not using cpap machine-feels like she is drowning    Past Surgical History:  Procedure Laterality Date   ABDOMINAL HYSTERECTOMY     APPENDECTOMY     BREAST SURGERY     CARDIAC CATHETERIZATION N/A 11/25/2014   Procedure: Left Heart Cath;  Surgeon: Vinie DELENA Jude, MD;  Location: [facility] INVASIVE CV LAB;  Service: Cardiovascular;  Laterality: N/A;   CARPAL TUNNEL RELEASE Right 05/08/2023   Procedure: RIGHT  CARPAL TUNNEL RELEASE WITH ULTRASOUND GUIDANCE;  Surgeon: Claudene Penne ORN, MD;  Location: [facility] ORS;  Service: Neurosurgery;  Laterality: Right;   CHOLECYSTECTOMY     COLONOSCOPY N/A 12/24/2023   Procedure: COLONOSCOPY;  Surgeon: Maryruth Ole ONEIDA, MD;  Location: [facility] ENDOSCOPY;  Service: Endoscopy;  Laterality: N/A;   COLONOSCOPY WITH PROPOFOL  N/A 08/05/2018   Procedure: COLONOSCOPY WITH PROPOFOL ;  Surgeon: Gaylyn Gladis PENNER, MD;  Location: [facility] ENDOSCOPY;  Service: Endoscopy;  Laterality: N/A;   FORAMINOTOMY 1 LEVEL Right 10/23/2022   Procedure: RIGHT L4-5 LAMINOFORAMINOTOMY;  Surgeon: Clois Fret, MD;  Location: [facility] ORS;  Service: Neurosurgery;  Laterality: Right;   MASTECTOMY Right    partial/lumpectomy   POLYPECTOMY  12/24/2023   Procedure: POLYPECTOMY, INTESTINE;  Surgeon: Maryruth Ole ONEIDA, MD;  Location: [facility] ENDOSCOPY;  Service: Endoscopy;;   THORACIC LAMINECTOMY FOR SPINAL CORD STIMULATOR N/A 04/30/2024   Procedure: THORACIC LAMINECTOMY FOR SPINAL CORD STIMULATOR;  Surgeon: Clois Fret, MD;  Location: [facility] ORS;  Service: Neurosurgery;  Laterality: N/A;  Thoracic Laminectomy for Spinal Cord Stimulator Placement    Social History Social History[2]  Family History Family History  Problem Relation Age of Onset   Cancer Mother    Hypertension Mother    Hypertension Father    Heart disease Father    Emphysema Father    Depression Father    Cancer Sister    Hypertension Sister  Allergies[3]   REVIEW OF SYSTEMS (Negative unless checked)  Constitutional: [] Weight loss  [] Fever  [] Chills Cardiac: [] Chest pain   [] Chest pressure   [] Palpitations   [] Shortness of breath when laying flat   [] Shortness of breath with exertion. Vascular:  [] Pain in legs with walking   [x] Pain in legs at rest  [] History of DVT   [] Phlebitis   [x] Swelling in legs   [] Varicose veins   [] Non-healing ulcers Pulmonary:   [] Uses home oxygen   [] Productive cough   [] Hemoptysis   [] Wheeze   [] COPD   [] Asthma Neurologic:  [] Dizziness   [] Seizures   [] History of stroke   [] History of TIA  [] Aphasia   [] Vissual changes   [] Weakness or numbness in arm   [] Weakness or numbness in leg Musculoskeletal:   [] Joint swelling   [] Joint pain   [] Low back pain Hematologic:  [] Easy bruising  [] Easy bleeding   [] Hypercoagulable state   [] Anemic Gastrointestinal:  [] Diarrhea   [] Vomiting  [] Gastroesophageal reflux/heartburn   [] Difficulty swallowing. Genitourinary:  [] Chronic kidney disease   [] Difficult urination  [] Frequent urination   [] Blood in urine Skin:  [] Rashes   [] Ulcers  Psychological:  [] History of anxiety   []  History of major depression.  Physical Examination  There were no vitals filed for this visit. There is no height or weight on file to calculate BMI. Gen: WD/WN, NAD Head: [place]/AT, No temporalis wasting.  Ear/Nose/Throat: Hearing grossly intact, nares w/o erythema or drainage, pinna without lesions Eyes: PER, EOMI, sclera nonicteric.  Neck: Supple, no gross masses.  No JVD.  Pulmonary:  Good air movement, no audible wheezing, no use of accessory muscles.  Cardiac: RRR, precordium not hyperdynamic. Vascular:  scattered varicosities present bilaterally.  Mild venous stasis changes to the legs bilaterally right more pronounced than the left.  1+ soft pitting edema. CEAP C4sEpAsPr   Vessel Right Left  Radial Palpable Palpable  DP Palpable Palpable  PT Palpable Palpable  Gastrointestinal: soft, non-distended. No guarding/no peritoneal signs.  Musculoskeletal: M/S 5/5 throughout.  No deformity.  Neurologic: CN 2-12 intact. Pain and light touch intact in extremities.  Symmetrical.  Speech is fluent. Motor exam as listed above. Psychiatric: Judgment intact, Mood & affect appropriate for pt's clinical situation. Dermatologic: Venous rashes no ulcers noted.  No changes consistent with cellulitis. Lymph : No lichenification or skin changes of chronic lymphedema.  CBC Lab Results   Component Value Date   WBC 8.2 02/05/2015   HGB 13.1 02/05/2015   HCT 40.2 02/05/2015   MCV 84.7 02/05/2015   PLT 184 02/05/2015    BMET    Component Value Date/Time   NA 136 10/06/2022 1150   K 3.6 10/06/2022 1150   CL 100 10/06/2022 1150   CO2 29 10/06/2022 1150   GLUCOSE 93 10/06/2022 1150   BUN 11 10/06/2022 1150   CREATININE 0.57 10/06/2022 1150   CALCIUM 9.1 10/06/2022 1150   GFRNONAA >60 10/06/2022 1150   GFRAA >60 02/05/2015 1603   CrCl cannot be calculated (Patient's most recent lab result is older than the maximum 21 days allowed.).  COAG No results found for: INR, PROTIME  Radiology No results found.   Assessment/Plan 1. Chronic venous insufficiency (Primary) No surgery or intervention at this point in time.   The patient is CEAP C4sEpAsPr   I have discussed with the patient venous insufficiency and why it  causes symptoms. I have discussed with the patient the chronic skin changes that accompany venous insufficiency and the long term  sequela such as infection and ulceration.  Patient will continue to try wearing graduated compression stockings or compression wraps on a daily basis.  This is very difficult given I believe the etiology of her lower extremity pain and symptoms is radicular and this is often exacerbated by the stockings.  In addition, behavioral modification including several periods of elevation of the lower extremities during the day will be continued. I have demonstrated that proper elevation is a position with the ankles at heart level.  The patient is instructed to begin routine exercise, especially walking on a daily basis  2. Other hyperlipidemia Continue statin as ordered and reviewed, no changes at this time  3. Chronic radicular lumbar pain Recommend:  The patient has atypical pain symptoms for vascular disease and on exam I do not find evidence of vascular pathology that would explain the patient's symptoms.  Noninvasive  studies do not identify significant vascular problems  I suspect the patient is c/o pseudoclaudication.  Patient should have further workup of the LS spine which I defer to the Spine service.  The patient should continue exercise as tolerated.    Cordella Shawl, MD  07/02/2024 1:16 PM      [1]  No outpatient medications have been marked as taking for the 07/03/24 encounter (Appointment) with Shawl, Cordella MATSU, MD.  [2]  Social History Tobacco Use   Smoking status: Never   Smokeless tobacco: Never  Vaping Use   Vaping status: Never Used  Substance Use Topics   Alcohol use: Yes    Alcohol/week: 5.0 standard drinks of alcohol    Types: 5 Shots of liquor per week   Drug use: No  [3] No Known Allergies  "

## 2024-07-03 ENCOUNTER — Ambulatory Visit (INDEPENDENT_AMBULATORY_CARE_PROVIDER_SITE_OTHER)

## 2024-07-03 ENCOUNTER — Encounter (INDEPENDENT_AMBULATORY_CARE_PROVIDER_SITE_OTHER)

## 2024-07-03 ENCOUNTER — Telehealth: Payer: Self-pay | Admitting: Neurosurgery

## 2024-07-03 ENCOUNTER — Ambulatory Visit (INDEPENDENT_AMBULATORY_CARE_PROVIDER_SITE_OTHER): Admitting: Vascular Surgery

## 2024-07-03 ENCOUNTER — Encounter (INDEPENDENT_AMBULATORY_CARE_PROVIDER_SITE_OTHER): Payer: Self-pay | Admitting: Vascular Surgery

## 2024-07-03 VITALS — BP 100/71 | HR 103 | Resp 17 | Ht 68.0 in | Wt 246.2 lb

## 2024-07-03 DIAGNOSIS — M5416 Radiculopathy, lumbar region: Secondary | ICD-10-CM

## 2024-07-03 DIAGNOSIS — G8929 Other chronic pain: Secondary | ICD-10-CM

## 2024-07-03 DIAGNOSIS — I872 Venous insufficiency (chronic) (peripheral): Secondary | ICD-10-CM

## 2024-07-03 DIAGNOSIS — E7849 Other hyperlipidemia: Secondary | ICD-10-CM

## 2024-07-03 NOTE — Telephone Encounter (Signed)
 I spoke to patient and she states it is not back pain. She is having electrical shocks down her legs to her feet. I advised her to contact [employer]. Patient voiced understanding and will contact her rep.

## 2024-07-03 NOTE — Telephone Encounter (Signed)
 Patient is concerned about her back pain and would like guidance on whether she should contact the [employer] representative for assistance with programming or schedule an in-office visit to address her concerns. Per Dr.Y's note: Will see her back on an as-needed basis. The rep will work on her with programming.

## 2024-07-08 ENCOUNTER — Encounter: Payer: Self-pay | Admitting: Podiatry
